# Patient Record
Sex: Female | Born: 1955
Health system: Southern US, Community
[De-identification: ages and names within clinical notes are randomized; demographics above are authoritative.]

## PROBLEM LIST (undated history)

## (undated) DIAGNOSIS — B029 Zoster without complications: Secondary | ICD-10-CM

## (undated) DIAGNOSIS — K259 Gastric ulcer, unspecified as acute or chronic, without hemorrhage or perforation: Secondary | ICD-10-CM

## (undated) DIAGNOSIS — E278 Other specified disorders of adrenal gland: Secondary | ICD-10-CM

## (undated) HISTORY — DX: Zoster without complications: B02.9

## (undated) HISTORY — DX: Gastric ulcer, unspecified as acute or chronic, without hemorrhage or perforation: K25.9

---

## 1983-07-03 HISTORY — PX: TUBAL LIGATION: SHX77

## 2011-08-21 ENCOUNTER — Other Ambulatory Visit (HOSPITAL_COMMUNITY): Payer: Self-pay | Admitting: Family Medicine

## 2011-08-21 DIAGNOSIS — IMO0002 Reserved for concepts with insufficient information to code with codable children: Secondary | ICD-10-CM

## 2011-08-23 ENCOUNTER — Ambulatory Visit (HOSPITAL_COMMUNITY)
Admission: RE | Admit: 2011-08-23 | Discharge: 2011-08-23 | Disposition: A | Discharge status: Home / Self Care | Source: Ambulatory Visit | Attending: Family Medicine | Admitting: Family Medicine

## 2011-08-23 DIAGNOSIS — IMO0002 Reserved for concepts with insufficient information to code with codable children: Secondary | ICD-10-CM

## 2011-09-27 ENCOUNTER — Emergency Department (HOSPITAL_COMMUNITY)
Admission: EM | Admit: 2011-09-27 | Discharge: 2011-09-27 | Disposition: A | Discharge status: Home / Self Care | Attending: Emergency Medicine | Admitting: Emergency Medicine

## 2011-09-27 ENCOUNTER — Emergency Department (HOSPITAL_COMMUNITY)

## 2011-09-27 ENCOUNTER — Encounter (HOSPITAL_COMMUNITY): Payer: Self-pay | Admitting: Physical Medicine and Rehabilitation

## 2011-09-27 ENCOUNTER — Other Ambulatory Visit: Payer: Self-pay

## 2011-09-27 DIAGNOSIS — H8110 Benign paroxysmal vertigo, unspecified ear: Secondary | ICD-10-CM

## 2011-09-27 HISTORY — DX: Other specified disorders of adrenal gland: E27.8

## 2011-09-27 LAB — BASIC METABOLIC PANEL
BUN: 14 mg/dL (ref 6–23)
Calcium: 8.7 mg/dL (ref 8.4–10.5)
Creatinine, Ser: 0.61 mg/dL (ref 0.50–1.10)
GFR calc Af Amer: >90 mL/min (ref >90)
GFR calc non Af Amer: >90 mL/min (ref >90)
Glucose, Bld: 100 mg/dL — ABNORMAL HIGH (ref 70–99)
Potassium: 4.3 mEq/L (ref 3.5–5.1)

## 2011-09-27 LAB — DIFFERENTIAL
Basophils Relative: 0 % (ref 0–1)
Eosinophils Absolute: 0.3 10*3/uL (ref 0.0–0.7)
Eosinophils Relative: 3 % (ref 0–5)
Monocytes Absolute: 0.3 10*3/uL (ref 0.1–1.0)
Monocytes Relative: 4 % (ref 3–12)
Neutrophils Relative %: 70 % (ref 43–77)

## 2011-09-27 LAB — CBC
Hemoglobin: 12.8 g/dL (ref 12.0–15.0)
MCH: 27.5 pg (ref 26.0–34.0)
MCHC: 32 g/dL (ref 30.0–36.0)
MCV: 85.8 fL (ref 78.0–100.0)

## 2011-09-27 MED ORDER — MECLIZINE HCL 25 MG PO TABS
50.0000 mg | ORAL_TABLET | Freq: Once | ORAL | Status: AC
  Administered 2011-09-27: 50 mg via ORAL
  Filled 2011-09-27: qty 2

## 2011-09-27 MED ORDER — LORAZEPAM 1 MG PO TABS: 1.0000 mg | ORAL_TABLET | Freq: Four times a day (QID) | ORAL | Status: AC | PRN

## 2011-09-27 MED ORDER — LORAZEPAM 1 MG PO TABS
1.0000 mg | ORAL_TABLET | Freq: Once | ORAL | Status: AC
  Administered 2011-09-27: 1 mg via ORAL
  Filled 2011-09-27: qty 1

## 2011-09-27 MED ORDER — MECLIZINE HCL 50 MG PO TABS: 50.0000 mg | ORAL_TABLET | Freq: Two times a day (BID) | ORAL | Status: AC | PRN

## 2011-09-27 NOTE — ED Notes (Signed)
Pt presents to department for evaluation of dizziness. Ongoing x5 days. Pt states symptoms became worse today. Able to move all extremities. Pupils round and reactive. Speech clear. She is alert and oriented x4. Pt states "I feel like I am drunk." denies pain.

## 2011-09-27 NOTE — ED Notes (Signed)
Pt presents with 5 day  H/o dizziness.  Pt denies any headache, reports symptoms occur when she is up and moving, reports dizziness stops when she closes her eyes and goes to sleep.  Pt requesting CT scan, is alert, oriented and in no acute distress.  Pt reports "I thought it was swimmer's ear".   Pt denies any nausea.

## 2011-09-27 NOTE — Discharge Instructions (Signed)
Benign Positional Vertigo Vertigo means you feel like you or your surroundings are moving when they are not. Benign positional vertigo is the most common form of vertigo. Benign means that the cause of your condition is not serious. Benign positional vertigo is more common in older adults. CAUSES  Benign positional vertigo is the result of an upset in the labyrinth system. This is an area in the middle ear that helps control your balance. This may be caused by a viral infection, head injury, or repetitive motion. However, often no specific cause is found. SYMPTOMS  Symptoms of benign positional vertigo occur when you move your head or eyes in different directions. Some of the symptoms may include:  Loss of balance and falls.   Vomiting.   Blurred vision.   Dizziness.   Nausea.   Involuntary eye movements (nystagmus).  DIAGNOSIS  Benign positional vertigo is usually diagnosed by physical exam. If the specific cause of your benign positional vertigo is unknown, your caregiver may perform imaging tests, such as magnetic resonance imaging (MRI) or computed tomography (CT). TREATMENT  Your caregiver may recommend movements or procedures to correct the benign positional vertigo. Medicines such as meclizine, benzodiazepines, and medicines for nausea may be used to treat your symptoms. In rare cases, if your symptoms are caused by certain conditions that affect the inner ear, you may need surgery. HOME CARE INSTRUCTIONS   Follow your caregiver's instructions.   Move slowly. Do not make sudden body or head movements.   Avoid driving.   Avoid operating heavy machinery.   Avoid performing any tasks that would be dangerous to you or others during a vertigo episode.   Drink enough fluids to keep your urine clear or pale yellow.  SEEK IMMEDIATE MEDICAL CARE IF:   You develop problems with walking, weakness, numbness, or using your arms, hands, or legs.   You have difficulty speaking.   You  develop severe headaches.   Your nausea or vomiting continues or gets worse.   You develop visual changes.   Your family or friends notice any behavioral changes.   Your condition gets worse.   You have a fever.   You develop a stiff neck or sensitivity to light.  MAKE SURE YOU:   Understand these instructions.   Will watch your condition.   Will get help right away if you are not doing well or get worse.  Document Released: 03/26/2006 Document Revised: 06/07/2011 Document Reviewed: 03/08/2011 ExitCare Patient Information 2012 [employer].Dizziness Dizziness is a common problem. It is a feeling of unsteadiness or lightheadedness. You may feel like you are about to faint. Dizziness can lead to injury if you stumble or fall. A person of any age group can suffer from dizziness, but dizziness is more common in older adults. CAUSES  Dizziness can be caused by many different things, including:  Middle ear problems.   Standing for too long.   Infections.   An allergic reaction.   Aging.   An emotional response to something, such as the sight of blood.   Side effects of medicines.   Fatigue.   Problems with circulation or blood pressure.   Excess use of alcohol, medicines, or illegal drug use.   Breathing too fast (hyperventilation).   An arrhythmia or problems with your heart rhythm.   Low red blood cell count (anemia).   Pregnancy.   Vomiting, diarrhea, fever, or other illnesses that cause dehydration.   Diseases or conditions such as Parkinson's disease, high blood   pressure (hypertension), diabetes, and thyroid problems.   Exposure to extreme heat.  DIAGNOSIS  To find the cause of your dizziness, your caregiver may do a physical exam, lab tests, radiologic imaging scans, or an electrocardiography test (ECG).  TREATMENT  Treatment of dizziness depends on the cause of your symptoms and can vary greatly. HOME CARE INSTRUCTIONS   Drink enough fluids to keep  your urine clear or pale yellow. This is especially important in very hot weather. In the elderly, it is also important in cold weather.   If your dizziness is caused by medicines, take them exactly as directed. When taking blood pressure medicines, it is especially important to get up slowly.   Rise slowly from chairs and steady yourself until you feel okay.   In the morning, first sit up on the side of the bed. When this seems okay, stand slowly while holding onto something until you know your balance is fine.   If you need to stand in one place for a long time, be sure to move your legs often. Tighten and relax the muscles in your legs while standing.   If dizziness continues to be a problem, have someone stay with you for a day or two. Do this until you feel you are well enough to stay alone. Have the person call your caregiver if he or she notices changes in you that are concerning.   Do not drive or use heavy machinery if you feel dizzy.  SEEK IMMEDIATE MEDICAL CARE IF:   Your dizziness or lightheadedness gets worse.   You feel nauseous or vomit.   You develop problems with talking, walking, weakness, or using your arms, hands, or legs.   You are not thinking clearly or you have difficulty forming sentences. It may take a friend or family member to determine if your thinking is normal.   You develop chest pain, abdominal pain, shortness of breath, or sweating.   Your vision changes.   You notice any bleeding.   You have side effects from medicine that seems to be getting worse rather than better.  MAKE SURE YOU:   Understand these instructions.   Will watch your condition.   Will get help right away if you are not doing well or get worse.  Document Released: 12/12/2000 Document Revised: 06/07/2011 Document Reviewed: 01/05/2011 ExitCare Patient Information 2012 [employer]. 

## 2011-09-27 NOTE — ED Provider Notes (Signed)
History     CSN: [id number]  Arrival date & time 09/27/11  1215   First MD Initiated Contact with Patient 09/27/11 1239      Chief Complaint  Patient presents with  . Dizziness    (Consider location/radiation/quality/duration/timing/severity/associated sxs/prior treatment) HPI Comments: The patient is a 56 year old female, with a history of adrenal hyperplasia, who presents for evaluation of 5 days of dizziness/vertiginous symptoms. She denies any preceding head injury, illness, and denies any headache, changes in vision, changes in hearing, focal numbness or weakness elsewhere in the body. She denies similar symptoms previously. Her symptoms are worsened with any movement of the head, and improved by laying still for prolonged period. In the emergency department, nystagmus is elicited along with symptoms by having the patient sit up, way back, or rotate/move ahead otherwise suggesting peripheral vertigo. Nystagmus is fatigable and does stop with remaining still, however the patient (even without nystagmus) states that symptoms continue when lying still. She reports that the vertigo is moderate to severe in intensity. She has not tried any treatment for her symptoms before coming to the emergency department.  The history is provided by the patient.    Past Medical History  Diagnosis Date  . Adrenal hyperplasia     History reviewed. No pertinent past surgical history.  No family history on file.  History  Substance Use Topics  . Smoking status: Never Smoker   . Smokeless tobacco: Not on file  . Alcohol Use: No    OB History    Grav Para Term Preterm Abortions TAB SAB Ect Mult Living                  Review of Systems  Constitutional: Negative for fever, chills, diaphoresis and appetite change.  HENT: Negative for hearing loss, ear pain, congestion, sore throat, facial swelling, rhinorrhea, trouble swallowing, neck pain, neck stiffness, dental problem, sinus pressure and  tinnitus.   Eyes: Negative for photophobia, pain, discharge, redness and visual disturbance.  Respiratory: Negative.   Cardiovascular: Negative.  Negative for palpitations.  Gastrointestinal: Negative for nausea, vomiting and abdominal pain.  Genitourinary: Negative.   Musculoskeletal: Negative for myalgias, back pain, arthralgias and gait problem.  Skin: Negative for pallor and rash.  Neurological: Positive for dizziness and light-headedness. Negative for tremors, seizures, syncope, facial asymmetry, speech difficulty, weakness, numbness and headaches.  Hematological: Negative for adenopathy.  Psychiatric/Behavioral: Negative.  Negative for confusion.    Allergies  Review of patient's allergies indicates no known allergies.  Home Medications   Current Outpatient Rx  Name Route Sig Dispense Refill  . CRANBERRY EXTRACT PO Oral Take 1 capsule by mouth daily.    Marland Kitchen GNP GINGKO BILOBA EXTRACT PO Oral Take 1 tablet by mouth daily.    Marland Kitchen GINSENG PO Oral Take 4 tablets by mouth daily.    Marland Kitchen HYDROCORTISONE 10 MG PO TABS Oral Take 20 mg by mouth 2 (two) times daily.    . IBUPROFEN 800 MG PO TABS Oral Take 800 mg by mouth every 8 (eight) hours as needed. For pain      BP 112/82  Pulse 81  Temp(Src) 97.9 F (36.6 C) (Oral)  Resp 20  SpO2 100%  Physical Exam  Nursing note and vitals reviewed. Constitutional: She is oriented to person, place, and time. She appears well-nourished. She is active and cooperative.  Non-toxic appearance. She does not have a sickly appearance. She does not appear ill. No distress.  HENT:  Head: Normocephalic and atraumatic. Head is  without raccoon's eyes, without Battle's sign and without contusion. No trismus in the jaw.  Right Ear: Hearing, tympanic membrane, external ear and ear canal normal.  Left Ear: Hearing, tympanic membrane, external ear and ear canal normal.  Nose: Nose normal.  Mouth/Throat: Uvula is midline, oropharynx is clear and moist and mucous  membranes are normal. No uvula swelling. No oropharyngeal exudate.  Eyes: Conjunctivae, EOM and lids are normal. Pupils are equal, round, and reactive to light. Right eye exhibits no chemosis, no discharge and no exudate. Left eye exhibits no chemosis, no discharge and no exudate. Right conjunctiva is not injected. Left conjunctiva is not injected. Right eye exhibits normal extraocular motion and no nystagmus. Left eye exhibits normal extraocular motion and no nystagmus.  Neck: Trachea normal, normal range of motion and phonation normal. Neck supple. No JVD present. Carotid bruit is not present. No rigidity. Normal range of motion present. No Brudzinski's sign noted.  Cardiovascular: Normal rate, regular rhythm, S1 normal, S2 normal, normal heart sounds, intact distal pulses and normal pulses.  Exam reveals no gallop and no friction rub.   No murmur heard. Pulmonary/Chest: Effort normal and breath sounds normal. No accessory muscle usage. Not tachypneic. No respiratory distress. She has no decreased breath sounds. She has no wheezes. She has no rhonchi. She has no rales.  Abdominal: Soft. Normal appearance, normal aorta and bowel sounds are normal. She exhibits no distension and no pulsatile midline mass. There is no tenderness. There is no rigidity.  Musculoskeletal: Normal range of motion. She exhibits no edema and no tenderness.  Neurological: She is alert and oriented to person, place, and time. She has normal strength. She is not disoriented. She displays no tremor and normal reflexes. No cranial nerve deficit or sensory deficit. She exhibits normal muscle tone. She displays no seizure activity. Coordination and gait normal. GCS eye subscore is 4. GCS verbal subscore is 5. GCS motor subscore is 6. She displays no Babinski's sign on the right side. She displays no Babinski's sign on the left side.  Reflex Scores:      Bicep reflexes are 2+ on the right side and 2+ on the left side.       Brachioradialis reflexes are 2+ on the right side and 2+ on the left side.      Patellar reflexes are 2+ on the right side and 2+ on the left side.      Patient with lateral nystagmus reproduced with the Dix-Hallpike maneuver, fatigable, and with no nystagmus when lying still. The patient reports that she "still feels a little" even while lying still and no nystagmus present. She has normal coordination by finger-nose-finger test and heel-to-shin test. She has a normal and steady independent gait. No cranial nerve deficits. No focal neurologic deficits other than the vertigo is elicited above.  Skin: Skin is warm, dry and intact. She is not diaphoretic. No pallor.  Psychiatric: She has a normal mood and affect. Her speech is normal and behavior is normal. Thought content normal. Cognition and memory are normal.    ED Course  Procedures (including critical care time)   Date: 09/27/2011  Rate: 71  Rhythm: normal sinus rhythm  QRS Axis: normal  Intervals: normal  ST/T Wave abnormalities: normal  Conduction Disutrbances: none  Narrative Interpretation: unremarkable       Labs Reviewed  CBC  DIFFERENTIAL  BASIC METABOLIC PANEL   No results found.   No diagnosis found.    MDM  The patient appears to  have peripheral vertigo, with nystagmus and symptoms that are able to be elicited with movement of the head, nystagmus that is fatigable, and no focal neurologic deficits otherwise, but she reports symptoms to persist with remaining still, although improved, and with no nystagmus. The patient is insistent upon receiving a head CT to exclude intracranial mass lesion or other finding. ECG performed and not suggestive of arrhythmia. With the patient's history of adrenal hyperplasia, I will also assess for electrolyte abnormality. Lastly, I will assess for anemia.        Felisa Bonier, MD 09/27/11 [phone]

## 2014-02-19 ENCOUNTER — Ambulatory Visit (INDEPENDENT_AMBULATORY_CARE_PROVIDER_SITE_OTHER)

## 2014-02-19 ENCOUNTER — Ambulatory Visit (INDEPENDENT_AMBULATORY_CARE_PROVIDER_SITE_OTHER): Admitting: Family Medicine

## 2014-02-19 ENCOUNTER — Encounter: Payer: Self-pay | Admitting: Family Medicine

## 2014-02-19 ENCOUNTER — Ambulatory Visit

## 2014-02-19 VITALS — BP 104/78 | HR 77 | Temp 98.2°F | Resp 14 | Ht 60.5 in | Wt 239.6 lb

## 2014-02-19 DIAGNOSIS — M79641 Pain in right hand: Secondary | ICD-10-CM

## 2014-02-19 DIAGNOSIS — Z131 Encounter for screening for diabetes mellitus: Secondary | ICD-10-CM

## 2014-02-19 DIAGNOSIS — M25529 Pain in unspecified elbow: Secondary | ICD-10-CM

## 2014-02-19 DIAGNOSIS — M25549 Pain in joints of unspecified hand: Secondary | ICD-10-CM

## 2014-02-19 DIAGNOSIS — R5383 Other fatigue: Secondary | ICD-10-CM

## 2014-02-19 DIAGNOSIS — R5381 Other malaise: Secondary | ICD-10-CM

## 2014-02-19 DIAGNOSIS — M79642 Pain in left hand: Secondary | ICD-10-CM

## 2014-02-19 DIAGNOSIS — M79609 Pain in unspecified limb: Secondary | ICD-10-CM

## 2014-02-19 DIAGNOSIS — E259 Adrenogenital disorder, unspecified: Secondary | ICD-10-CM

## 2014-02-19 DIAGNOSIS — E250 Congenital adrenogenital disorders associated with enzyme deficiency: Secondary | ICD-10-CM

## 2014-02-19 DIAGNOSIS — R252 Cramp and spasm: Secondary | ICD-10-CM

## 2014-02-19 DIAGNOSIS — R635 Abnormal weight gain: Secondary | ICD-10-CM

## 2014-02-19 LAB — COMPLETE METABOLIC PANEL WITHOUT GFR
Albumin: 4 g/dL (ref 3.5–5.2)
CO2: 27 meq/L (ref 19–32)
Calcium: 8.7 mg/dL (ref 8.4–10.5)
Chloride: 107 meq/L (ref 96–112)
GFR, Est African American: >89 mL/min
GFR, Est Non African American: >89 mL/min
Glucose, Bld: 103 mg/dL — ABNORMAL HIGH (ref 70–99)
Potassium: 4.2 meq/L (ref 3.5–5.3)
Sodium: 143 meq/L (ref 135–145)
Total Protein: 7.4 g/dL (ref 6.0–8.3)

## 2014-02-19 LAB — POCT SEDIMENTATION RATE: POCT SED RATE: 30 mm/hr — AB (ref 0–22)

## 2014-02-19 LAB — LIPID PANEL
Cholesterol: 169 mg/dL (ref 0–200)
HDL: 42 mg/dL (ref >39)
LDL Cholesterol: 102 mg/dL — ABNORMAL HIGH (ref 0–99)
Total CHOL/HDL Ratio: 4 ratio
Triglycerides: 124 mg/dL (ref <150)
VLDL: 25 mg/dL (ref 0–40)

## 2014-02-19 LAB — CBC
HCT: 39.9 % (ref 36.0–46.0)
Hemoglobin: 13.4 g/dL (ref 12.0–15.0)
MCH: 27.8 pg (ref 26.0–34.0)
MCHC: 33.6 g/dL (ref 30.0–36.0)
MCV: 82.8 fL (ref 78.0–100.0)
Platelets: 285 10*3/uL (ref 150–400)
RBC: 4.82 MIL/uL (ref 3.87–5.11)
RDW: 13.4 % (ref 11.5–15.5)
WBC: 7.5 10*3/uL (ref 4.0–10.5)

## 2014-02-19 LAB — TSH: TSH: 1.714 u[IU]/mL (ref 0.350–4.500)

## 2014-02-19 LAB — COMPLETE METABOLIC PANEL WITH GFR
ALT: 53 U/L — ABNORMAL HIGH (ref 0–35)
AST: 39 U/L — ABNORMAL HIGH (ref 0–37)
Alkaline Phosphatase: 68 U/L (ref 39–117)
BUN: 10 mg/dL (ref 6–23)
Creat: 0.65 mg/dL (ref 0.50–1.10)
Total Bilirubin: 0.6 mg/dL (ref 0.2–1.2)

## 2014-02-19 LAB — POCT GLYCOSYLATED HEMOGLOBIN (HGB A1C): Hemoglobin A1C: 5.7

## 2014-02-19 LAB — RHEUMATOID FACTOR: Rheumatoid fact SerPl-aCnc: <10 [IU]/mL (ref <=14)

## 2014-02-19 MED ORDER — MELOXICAM 15 MG PO TABS: 15.0000 mg | ORAL_TABLET | Freq: Every day | ORAL | Status: DC

## 2014-02-19 NOTE — Progress Notes (Signed)
Chief Complaint:  Chief Complaint  Patient presents with  . other    pt would like to be tested and see if she has arthritis  . other    establish care    HPI: Tracy Larson is a 58 y.o. female who is here for lab work since she feels tired and she has had cramps in her figners, she can walk from here to out the front door before she feels tired, she ahs had abut 100 lb weight gain, primarily in her midsection. She has a history of [place] Adrenal Hyperplasia and has been on steroids for a long time. She states her right hand pain , she has pain in both hands, is worse than left. She is right hand dominant. Adrenal Hyperplasia-she was seeing Dr Sharl Ma ( who is now working at and transferred to [facility], she had an outstanding bill so was  told to get herself  another endocrinologist) She is tryign to establish care with an endocrinologist Dr Altheimer but has not heard back yet. She denesi any HA, vision changes, n/v/abd pain, paresthesia.   Wt Readings from Last 3 Encounters:  02/19/14 239 lb 9.6 oz (108.682 kg)      Past Medical History  Diagnosis Date  . Adrenal hyperplasia    History reviewed. No pertinent past surgical history. History   Social History  . Marital Status: Married    Spouse Name: [name]    Number of Children: [name]  . Years of Education: [name]   Social History Main Topics  . Smoking status: Never Smoker   . Smokeless tobacco: None  . Alcohol Use: No  . Drug Use: None  . Sexual Activity: None   Other Topics Concern  . None   Social History Narrative  . None   History reviewed. No pertinent family history. No Known Allergies Prior to Admission medications   Medication Sig Start Date End Date Taking? Authorizing Provider  hydrocortisone (CORTEF) 10 MG tablet Take 20 mg by mouth 2 (two) times daily.   Yes Historical Provider, MD  ibuprofen (ADVIL,MOTRIN) 800 MG tablet Take 800 mg by mouth every 8 (eight) hours as needed. For pain   Yes Historical  Provider, MD     ROS: The patient denies fevers, chills, night sweats, unintentional weight loss, chest pain, palpitations, wheezing, dyspnea on exertion, nausea, vomiting, abdominal pain, dysuria, hematuria, melena, numbness, weakness, or tingling.   All other systems have been reviewed and were otherwise negative with the exception of those mentioned in the HPI and as above.    PHYSICAL EXAM: Filed Vitals:   02/19/14 0923  BP: 104/78  Pulse: 77  Temp: 98.2 F (36.8 C)  Resp: 14   Filed Vitals:   02/19/14 0923  Height: 5' 0.5" (1.537 m)  Weight: 239 lb 9.6 oz (108.682 kg)   Body mass index is 46.01 kg/(m^2).  General: Alert, no acute distress, obese, noncushingnoid in appearanace HEENT:  Normocephalic, atraumatic, oropharynx patent. EOMI, PERRLA Cardiovascular:  Regular rate and rhythm, no rubs murmurs or gallops.  No Carotid bruits, radial pulse intact. No pedal edema.  Respiratory: Clear to auscultation bilaterally.  No wheezes, rales, or rhonchi.  No cyanosis, no use of accessory musculature GI: No organomegaly, abdomen is soft and non-tender, positive bowel sounds.  No masses. Skin: No rashes. Neurologic: Facial musculature symmetric. Psychiatric: Patient is appropriate throughout our interaction. Lymphatic: No cervical lymphadenopathy Musculoskeletal: Gait intact. Hands-no nodules, she has paina t base of thumb mcp, right  and left, right greater than left Full ROM, full strength, sensation intact   LABS: Results for orders placed in visit on 02/19/14  POCT GLYCOSYLATED HEMOGLOBIN (HGB A1C)      Result Value Ref Range   Hemoglobin A1C 5.7       EKG/XRAY:   [facility] + djd , no fx or dislocations Please comment if there is any effects of chronic steroid use on bones   ASSESSMENT/PLAN: Encounter Diagnoses  Name Primary?  . Congenital adrenal hyperplasia Yes  . Muscle cramps   . Weight gain   . Other malaise and fatigue     . Pain in joint, upper arm, unspecified laterality   . Screening for diabetes mellitus   . Hand joint pain, right   . Hand pain, left    She needs an endocrinologist, if Dr Altheimer is not wiling to take her then will need to refer her, she will let us know No new meds Labs pending F/u pending labs  Gross sideeffects, risk and benefits, and alternatives of medications d/w patient. Patient is aware that all medications have potential sideeffects and we are unable to predict every sideeffect or drug-drug interaction that may occur.  Kariyah Baugh PHUONG, DO 02/19/2014 10:29 AM

## 2014-02-20 LAB — CORTISOL-AM, BLOOD: Cortisol - AM: 7.3 ug/dL (ref 4.3–22.4)

## 2014-02-23 ENCOUNTER — Other Ambulatory Visit: Payer: Self-pay

## 2014-02-23 NOTE — Telephone Encounter (Signed)
[facility] sent req for Rx for hydrocortisone tabs 10 mg. Dr Conley Rolls, you just saw pt but didn't Rx this for her. Do you want to Rx for pt until she sees endo?

## 2014-02-24 MED ORDER — HYDROCORTISONE 10 MG PO TABS: 20.0000 mg | ORAL_TABLET | Freq: Two times a day (BID) | ORAL | Status: DC

## 2014-02-25 ENCOUNTER — Encounter: Payer: Self-pay | Admitting: Family Medicine

## 2014-02-25 ENCOUNTER — Telehealth: Payer: Self-pay | Admitting: Family Medicine

## 2014-02-25 NOTE — Telephone Encounter (Signed)
LM about labs on phone

## 2014-03-01 ENCOUNTER — Telehealth: Payer: Self-pay

## 2014-03-01 NOTE — Telephone Encounter (Signed)
Pt says the medicine for her inflammatory arthritis is not working.  Wants to know if we can change it.  [phone]

## 2014-03-02 ENCOUNTER — Ambulatory Visit (INDEPENDENT_AMBULATORY_CARE_PROVIDER_SITE_OTHER): Admitting: Physician Assistant

## 2014-03-02 VITALS — BP 112/68 | HR 81 | Temp 98.0°F | Resp 16 | Ht 60.25 in | Wt 240.4 lb

## 2014-03-02 DIAGNOSIS — Z111 Encounter for screening for respiratory tuberculosis: Secondary | ICD-10-CM

## 2014-03-02 NOTE — Telephone Encounter (Signed)
Please advise lab results to give to pt in regards to arthritis. Pt is requesting something different for her pain. Mobic is not working anymore.

## 2014-03-02 NOTE — Progress Notes (Signed)
   Subjective:    Patient ID: Tracy Larson, female    DOB: [birth year], 57 y.o.   MRN: [id number]  HPI  Tuberculosis Risk Questionnaire  1. No Were you born outside the [place] in one of the following parts of the world: [place], [place], [place], [place] or [place]?    2. No Have you traveled outside the [place] and lived for more than one month in one of the following parts of the world: [place], [place], [place], [place] or [place]?    3. No  Do you have a compromised immune system such as from any of the following conditions:HIV/AIDS, organ or bone marrow transplantation, diabetes, immunosuppressive medicines (e.g. Prednisone, Remicaide), leukemia, lymphoma, cancer of the head or neck, gastrectomy or jejunal bypass, end-stage renal disease (on dialysis), or silicosis? Adrenal      4.Yes Have you ever or do you plan on working in: a residential care center, a health care facility, a jail or prison or homeless shelter?    5. No Have you ever: injected illegal drugs, used crack cocaine, lived in a homeless shelter  or been in jail or prison?     6. No Have you ever been exposed to anyone with infectious tuberculosis?    Tuberculosis Symptom Questionnaire  Do you currently have any of the following symptoms?  1. No Unexplained cough lasting more than 3 weeks?   2. No Unexplained fever lasting more than 3 weeks.   3. No Night Sweats (sweating that leaves the bedclothes and sheets wet)     4. No Shortness of Breath   5. No Chest Pain   6. No Unintentional weight loss    7. No Unexplained fatigue (very tired for no reason)     Review of Systems     Objective:   Physical Exam        Assessment & Plan:

## 2014-03-02 NOTE — Progress Notes (Signed)
   Subjective:    Patient ID: Tracy Larson, female    DOB: 07/02/1956, 58 y.o.   MRN: [id number]  HPI  Pt presents to clinic for a PPD for work.  She is a Patent examiner.  She had a neg PPD last year.  See CDC questionnaire   Review of Systems.     Objective:   Physical Exam  Vitals reviewed. Constitutional: She is oriented to person, place, and time. She appears well-developed and well-nourished.  HENT:  Head: Normocephalic and atraumatic.  Right Ear: External ear normal.  Left Ear: External ear normal.  Pulmonary/Chest: Effort normal.  Neurological: She is alert and oriented to person, place, and time.  Skin: Skin is warm and dry.  Psychiatric: She has a normal mood and affect. Her behavior is normal. Judgment and thought content normal.       Assessment & Plan:  Screening-pulmonary TB - Plan: TB Skin Test    Benny Lennert PA-C  [facility] 03/02/2014 2:10 PM

## 2014-03-05 ENCOUNTER — Ambulatory Visit (INDEPENDENT_AMBULATORY_CARE_PROVIDER_SITE_OTHER): Admitting: Radiology

## 2014-03-05 DIAGNOSIS — Z111 Encounter for screening for respiratory tuberculosis: Secondary | ICD-10-CM

## 2014-03-05 LAB — TB SKIN TEST
INDURATION: 0 mm
TB Skin Test: NEGATIVE

## 2014-04-03 ENCOUNTER — Other Ambulatory Visit: Payer: Self-pay | Admitting: Family Medicine

## 2014-04-03 MED ORDER — TRAMADOL HCL 50 MG PO TABS: 50.0000 mg | ORAL_TABLET | Freq: Two times a day (BID) | ORAL | Status: DC | PRN

## 2014-04-05 ENCOUNTER — Other Ambulatory Visit: Payer: Self-pay | Admitting: Family Medicine

## 2014-04-05 ENCOUNTER — Telehealth: Payer: Self-pay | Admitting: *Deleted

## 2014-04-05 NOTE — Telephone Encounter (Signed)
LM for pt- tramadol script is in pick up drawer. Asked to call back if she would like script faxed to her mail order company or she can pick it up to bring to a local pharmacy.

## 2014-05-07 ENCOUNTER — Other Ambulatory Visit: Payer: Self-pay | Admitting: Family Medicine

## 2014-05-10 NOTE — Telephone Encounter (Signed)
Dr Le, do you want to give RFs? 

## 2014-05-24 ENCOUNTER — Telehealth: Payer: Self-pay | Admitting: Family Medicine

## 2014-05-24 NOTE — Telephone Encounter (Signed)
LM about cortef, she was supposed to see an endocrinologist, if she does not have an appt then we can refill it but I prefer the endocrinologist to do it.

## 2014-05-25 ENCOUNTER — Telehealth: Payer: Self-pay

## 2014-05-25 NOTE — Telephone Encounter (Signed)
Patient is needing to let Dr Conley RollsLe know that she does not have an endocrinologist and is needing to see if you could refill the medicine for you.   Best#: [phone]

## 2014-05-25 NOTE — Telephone Encounter (Signed)
Pt needs to RTC for labs to be drawn and prescribe medication.

## 2014-05-27 NOTE — Telephone Encounter (Signed)
Called-left message for her to call back

## 2014-05-28 ENCOUNTER — Other Ambulatory Visit: Payer: Self-pay

## 2014-05-28 DIAGNOSIS — E250 Congenital adrenogenital disorders associated with enzyme deficiency: Secondary | ICD-10-CM

## 2014-05-28 NOTE — Telephone Encounter (Signed)
Dr Conley RollsLe, see also notes under 11/23 and 11/24 messages. Do you want to RF these? Pended.

## 2014-05-28 NOTE — Telephone Encounter (Signed)
Left message / 3rd attempt to reach/ advised come in for visit.

## 2014-05-28 NOTE — Telephone Encounter (Signed)
Refill on hydrocortisone and tramadol

## 2014-05-29 MED ORDER — HYDROCORTISONE 10 MG PO TABS: 20.0000 mg | ORAL_TABLET | Freq: Two times a day (BID) | ORAL | Status: DC

## 2014-05-29 MED ORDER — TRAMADOL HCL 50 MG PO TABS: 50.0000 mg | ORAL_TABLET | Freq: Two times a day (BID) | ORAL | Status: DC | PRN

## 2014-05-31 NOTE — Telephone Encounter (Addendum)
LMOM on pt's H # to CB (cell # was work # and pt doesn't work in the office). RFs sent into pharm but Dr Conley RollsLe wanted us to tell her that she REALLY does need to see an endocrinologist. Make sure pt doesn't need a referral. Tramadol faxed to [facility].

## 2014-06-02 NOTE — Addendum Note (Signed)
Addended by: Sheppard PlumberBRIGGS, Santos Sollenberger A on: 06/02/2014 01:52 PM   Modules accepted: Orders

## 2014-06-02 NOTE — Telephone Encounter (Signed)
I was able to reach pt who did agree to see an endocrinologist. Pended referral. Dr Conley RollsLe, I would have just put this order in, but wanted to send it to you in case you wanted to specify a particular Endo.

## 2014-06-04 ENCOUNTER — Ambulatory Visit (INDEPENDENT_AMBULATORY_CARE_PROVIDER_SITE_OTHER): Admitting: Family Medicine

## 2014-06-04 ENCOUNTER — Encounter: Payer: Self-pay | Admitting: Family Medicine

## 2014-06-04 VITALS — BP 100/68 | HR 88 | Temp 98.2°F | Resp 16 | Ht 61.0 in | Wt 239.0 lb

## 2014-06-04 DIAGNOSIS — M1990 Unspecified osteoarthritis, unspecified site: Secondary | ICD-10-CM

## 2014-06-04 DIAGNOSIS — E250 Congenital adrenogenital disorders associated with enzyme deficiency: Secondary | ICD-10-CM

## 2014-06-04 DIAGNOSIS — E259 Adrenogenital disorder, unspecified: Secondary | ICD-10-CM

## 2014-06-04 DIAGNOSIS — R748 Abnormal levels of other serum enzymes: Secondary | ICD-10-CM

## 2014-06-04 LAB — COMPLETE METABOLIC PANEL WITH GFR
AST: 34 U/L (ref 0–37)
Alkaline Phosphatase: 88 U/L (ref 39–117)
BUN: 21 mg/dL (ref 6–23)
GFR, Est Non African American: 72 mL/min
Glucose, Bld: 169 mg/dL — ABNORMAL HIGH (ref 70–99)
Potassium: 4.9 mEq/L (ref 3.5–5.3)
Sodium: 143 mEq/L (ref 135–145)
Total Bilirubin: 0.3 mg/dL (ref 0.2–1.2)

## 2014-06-04 LAB — COMPLETE METABOLIC PANEL WITHOUT GFR
ALT: 40 U/L — ABNORMAL HIGH (ref 0–35)
Albumin: 3.8 g/dL (ref 3.5–5.2)
CO2: 29 meq/L (ref 19–32)
Calcium: 8.7 mg/dL (ref 8.4–10.5)
Chloride: 106 meq/L (ref 96–112)
Creat: 0.89 mg/dL (ref 0.50–1.10)
GFR, Est African American: 83 mL/min
Total Protein: 7.3 g/dL (ref 6.0–8.3)

## 2014-06-04 MED ORDER — TRAMADOL HCL 50 MG PO TABS: 50.0000 mg | ORAL_TABLET | Freq: Two times a day (BID) | ORAL | Status: DC | PRN

## 2014-06-04 MED ORDER — MELOXICAM 15 MG PO TABS: ORAL_TABLET | ORAL | Status: DC

## 2014-06-04 NOTE — Patient Instructions (Signed)
Only take one mobic pill a day. If having more pain, may take a tramadol. Remember it may make you sleepy, so try it at night first. You will get a call to make an appointment with endocrinology. We will call you with the results of your tests today.

## 2014-06-04 NOTE — Progress Notes (Signed)
Subjective:    Patient ID: Tracy PringleJennifer Pettinger, female    DOB: 1956-04-28, 58 y.o.   MRN: [id number]  HPI  This is a 58 year old female with PMH congenital adrenal hyperplasia who is presenting needing a referral to endocrinology. She used to see Dr. Sharl MaKerr but was told she had to see another endocrinologist d/t an outstanding bill. She was trying to get in with Dr. Leslie DalesAltheimer but never heard back. She currently takes hydrocortisone 40 mg QAM and 40 mg QPM. She increases her daily dose by 10 mg if she is sick or if she has been exposed to someone who is sick.   Arthritis: At last visit she was prescribed mobic 15 mg. She reports this works really well for her pain. Prior to being prescribed mobic she was taking 3 aspirin 81 mg and 4 ibuprofen 200 mg every day for pain. She takes mobic once most days but admits she will sometimes take two.  Review of Systems  Constitutional: Negative for fever and chills.  HENT: Negative for congestion and sore throat.   Respiratory: Negative for cough and shortness of breath.   Cardiovascular: Negative for chest pain.  Gastrointestinal: Negative for nausea, vomiting, abdominal pain and diarrhea.  Genitourinary: Negative for dysuria and difficulty urinating.  Musculoskeletal: Positive for arthralgias.  Skin: Negative for rash.  Neurological: Negative for seizures and headaches.   Patient Active Problem List   Diagnosis Date Noted  . Arthritis 06/04/2014  . Congenital adrenal hyperplasia 02/21/2014   Prior to Admission medications   Medication Sig Start Date End Date Taking? Authorizing Provider  hydrocortisone (CORTEF) 10 MG tablet Take 2 tablets (20 mg total) by mouth 2 (two) times daily. 05/29/14  Yes Thao P Le, DO  meloxicam (MOBIC) 15 MG tablet TAKE 1 TABLET DAILY (NO OTHER NSAIDS, TAKE WITH FOOD) 04/06/14  Yes Lanier ClamNicole Bush V, PA-C   No Known Allergies Patient's social and family history were reviewed.  Objective:   Physical Exam  Constitutional: She  is oriented to person, place, and time. She appears well-developed and well-nourished. No distress.  obese  HENT:  Head: Normocephalic and atraumatic.  Right Ear: Hearing normal.  Left Ear: Hearing normal.  Nose: Nose normal.  Mouth/Throat: Uvula is midline and oropharynx is clear and moist.  Eyes: Conjunctivae and lids are normal. Right eye exhibits no discharge. Left eye exhibits no discharge. No scleral icterus.  Cardiovascular: Normal rate, regular rhythm, normal heart sounds, intact distal pulses and normal pulses.   No murmur heard. Pulmonary/Chest: Effort normal and breath sounds normal. No respiratory distress. She has no wheezes. She has no rhonchi. She has no rales.  Abdominal: Soft. There is no tenderness.  Musculoskeletal: Normal range of motion.  Lymphadenopathy:    She has no cervical adenopathy.  Neurological: She is alert and oriented to person, place, and time.  Skin: Skin is warm, dry and intact. No lesion and no rash noted.  No LE edema  Psychiatric: She has a normal mood and affect. Her speech is normal and behavior is normal. Thought content normal.   BP 100/68 mmHg  Pulse 88  Temp(Src) 98.2 F (36.8 C)  Resp 16  Ht 5\' 1"  (1.549 m)  Wt 239 lb (108.41 kg)  BMI 45.18 kg/m2  SpO2 98%     Assessment & Plan:  1. Arthritis Instructed pt she cannot take mobic 15 mg more than once a day, discussed effects on health. She was prescribed tramadol to take for arthritis pain not  controlled by mobic. Side effects discussed.  - traMADol (ULTRAM) 50 MG tablet; Take 1 tablet (50 mg total) by mouth every 12 (twelve) hours as needed. For arthritic pain not controlled by Mobic. May cause constipation. Please use with stool softener. If you have seizures, it may lower you seizure threshold.  Dispense: 30 tablet; Refill: 0 - meloxicam (MOBIC) 15 MG tablet; TAKE 1 TABLET DAILY (NO OTHER NSAIDS, TAKE WITH FOOD)  Dispense: 90 tablet; Refill: 0  2. Abnormal liver enzymes Blood work  at last visit revealed slightly elevated liver enzymes. Will repeat today and obtain hepatitis panel.  - Hepatitis panel, acute - COMPLETE METABOLIC PANEL WITH GFR  3. Congenital adrenal hyperplasia Pt is stable on current dose of hydrocortisone. Referred to se Dr. Elvera LennoxGherghe.  - Ambulatory referral to Endocrinology   Roswell MinersNicole V. Dyke BrackettBush, PA-C, MHS [facility]  06/04/2014

## 2014-06-05 LAB — HEPATITIS PANEL, ACUTE
HCV Ab: NEGATIVE
Hep A IgM: NONREACTIVE
Hep B C IgM: NONREACTIVE
Hepatitis B Surface Ag: NEGATIVE

## 2014-06-07 NOTE — Progress Notes (Signed)
Agree with A/P. Dr Genesys Coggeshall 

## 2014-06-30 ENCOUNTER — Encounter: Payer: Self-pay | Admitting: *Deleted

## 2014-07-13 ENCOUNTER — Telehealth: Payer: Self-pay

## 2014-07-13 NOTE — Telephone Encounter (Signed)
LMOM for pt that Rx was sent to [facility] on 06/04/14 for #90. Asked her to CB if she needs a Rx sent elsewhere and/or if she got this Rx?

## 2014-07-13 NOTE — Telephone Encounter (Signed)
Pt requesting refill on meloxicam (MOBIC) 15 MG tablet [[id number]][[id number]]

## 2014-07-14 ENCOUNTER — Telehealth: Payer: Self-pay

## 2014-07-14 NOTE — Telephone Encounter (Signed)
lmom for pt to call back

## 2014-07-14 NOTE — Telephone Encounter (Signed)
Pt had LM on VM after Melanie called her, but I also had a very hard time understanding her message. I tried to call her back but got her VM. I apologized that we kept having to leave messages and advised that we are open until 8:30 and if she'd like to CB and ask for the clin TL she will be happy to help her (I am leaving the office now).

## 2014-07-14 NOTE — Telephone Encounter (Signed)
Please call patient about one of her medications. I could not get a good grasp as to what patient was requesting. Patient was muffled and trying to talk very loudly and I was unable to understand which medications she was asking about. [facility] also went down during phone call as I was trying to look in patient's med list.  [phone]

## 2014-07-16 ENCOUNTER — Other Ambulatory Visit: Payer: Self-pay

## 2014-07-16 ENCOUNTER — Ambulatory Visit: Admitting: Internal Medicine

## 2014-07-16 DIAGNOSIS — M1990 Unspecified osteoarthritis, unspecified site: Secondary | ICD-10-CM

## 2014-07-16 NOTE — Telephone Encounter (Signed)
Pt would like a refill on meloxicam (MOBIC) 15 MG tablet [[id number]. She would like this sent to [facility]. Please advise at [phone]

## 2014-07-19 NOTE — Telephone Encounter (Signed)
Pt is only supposed to be taking one tablet of mobic a day. We discussed this extensively last visit. If she is out of her 90 pills, then she is taking mobic twice a day. She was prescribed tramadol to take for pain not controlled by mobic. Please ask pt how she is taking her mobic and tramadol.

## 2014-07-21 NOTE — Telephone Encounter (Signed)
Spoke with Tracy DikeJennifer, she states she was taking mobic from a prescription written back in 10/15.  She didn't realize she hadn't filled the most recent  Prescription and is going to do so.  She states she knows she is only suppose to take it once a day.

## 2014-07-29 ENCOUNTER — Ambulatory Visit: Admitting: Internal Medicine

## 2014-08-05 ENCOUNTER — Ambulatory Visit (INDEPENDENT_AMBULATORY_CARE_PROVIDER_SITE_OTHER): Admitting: Internal Medicine

## 2014-08-05 VITALS — BP 104/68 | HR 94 | Temp 98.1°F | Resp 12 | Ht 62.0 in | Wt 242.8 lb

## 2014-08-05 DIAGNOSIS — E250 Congenital adrenogenital disorders associated with enzyme deficiency: Secondary | ICD-10-CM

## 2014-08-05 DIAGNOSIS — E259 Adrenogenital disorder, unspecified: Secondary | ICD-10-CM

## 2014-08-05 MED ORDER — HYDROCORTISONE 10 MG PO TABS: 15.0000 mg | ORAL_TABLET | Freq: Two times a day (BID) | ORAL | Status: DC

## 2014-08-05 NOTE — Patient Instructions (Addendum)
Please come back for labs at 8 am, before taking your hydrocortisone in am.  Decrease hydrocortisone to 15 mg 2x a day. Start this after we get the labs.  Please let me know if you develop salt craving or get dizzy when you stand from a sitting position.  - You absolutely need to take the steroid medication every day and not skip doses. - Please double the dose if you have a fever, for the duration of the fever. - If you cannot take anything by mouth (vomiting) or you have severe diarrhea so that you eliminate the steroid pills in your stool, please make sure that you get the steroids in the vein instead - go to the nearest emergency department/urgent care or you may go to your PCPs office  - Please wear a MedAlert bracelet or pendant indicating: "Adrenal insufficiency".  Please return for another visit in 3 months.

## 2014-08-05 NOTE — Progress Notes (Signed)
Patient ID: Tracy Larson, female   DOB: 05-05-1956, 59 y.o.   MRN: [id number]   HPI  Tracy Larson is a 59 y.o.-year-old female, referred by her PCP, Dr. Marin Comment, for management of classical congenital adrenal hyperplasia. She has been followed by Dr. Buddy Duty, but had to switch dr's b/c outstanding bill with his office. I do not have records from Dr Buddy Duty.  Pt. has been dx with adrenal insufficiency at birth and then 59 y/o - became pregnant (she had 4 children - no infertility).   She did not have to have vaginoplasty.  Pt is on replacement with hydrocortisone - initially on 15 mg a day, but over the years she had to increase to 100 mg daily for 15 years when her children were growing, then decreased to 50 mg daily for another 10 years, then 40 mg for 16 years.: - am: 20 mg - mid-day: - - evening: 20 mg  She tells tells me she cannot function at lower doses of steroid  She is also on fludrocortisone, but developed edema >> had to stop.  No salt-craving. No sxs of orthostasis.  She has h/o adrenal crises:   1985   2005 - 2/2 flu [facility]).  She is aware that she needs to increase the dose of her glucocorticoid (GC) if she has a fever >101F and she needs to get GC injectable if she cannot take po.   Her height is: 5'1.5". Mother: ?Marland Kitchen Father: 5'7".  She does not have weight gain, DM2, HTN, or osteoporosis/fractures. She had a DEXA scan in 2013:  Results: [facility] Lumbar spine (L1-L3) Femoral neck (FN) 33% distal radius  T-score + 0.5 RFN:- 0.1 LFN:+ 0.5 n/a   She has bone and mm pain. She was started on Meloxicam, Ultram >> helps.  She has hirsutism - stable, was not on OCPs. No acne.   She did not have genetic counseling at dx.  Pt c/o: - + mm, joint, and bone pain - No other complaints  Reviewed most recent pertinent labs: Component     Latest Ref Rng 02/19/2014 06/04/2014  Sodium     135 - 145 mEq/L  143  Potassium     3.5 - 5.3 mEq/L  4.9  Chloride      96 - 112 mEq/L  106  CO2     19 - 32 mEq/L  29  Glucose     70 - 99 mg/dL  169 (H)  BUN     6 - 23 mg/dL  21  Creatinine     0.50 - 1.10 mg/dL  0.89  Total Bilirubin     0.2 - 1.2 mg/dL  0.3  Alkaline Phosphatase     39 - 117 U/L  88  AST     0 - 37 U/L  34  ALT     0 - 35 U/L  40 (H)  Total Protein     6.0 - 8.3 g/dL  7.3  Albumin     3.5 - 5.2 g/dL  3.8  Calcium     8.4 - 10.5 mg/dL  8.7  GFR, Est African American       83  GFR, Est Non African American       72  Hgb A1c MFr Bld      5.7   TSH     0.350 - 4.500 uIU/mL 1.714   Cortisol - AM     4.3 - 22.4 ug/dL 7.3    Meals:  Mostly fast foods  ROS: Constitutional: no weight gain/loss, + increased appetite,  no fatigue, no subjective hyperthermia/hypothermia, + nocturia Eyes: no blurry vision, no xerophthalmia ENT: no sore throat, no nodules palpated in throat, no dysphagia/odynophagia, no hoarseness Cardiovascular: no CP/SOB/palpitations/leg swelling Respiratory: no cough/SOB Gastrointestinal: no N/V/D/C Musculoskeletal: +both: muscle/joint aches Skin: no rashes Neurological: no tremors/numbness/tingling/dizziness Psychiatric: no depression/anxiety  Past Medical History  Diagnosis Date  . Adrenal hyperplasia    Past surgical history: - C sections  History   Social History  . Marital Status: Married    Spouse Name: [name]    Number of Children: 4   Occupational History  .  nurse tech    Social History Main Topics  . Smoking status: Never Smoker   . Smokeless tobacco: Not on file  . Alcohol Use: No  . Drug Use: No   Current Outpatient Prescriptions on File Prior to Visit  Medication Sig Dispense Refill  . hydrocortisone (CORTEF) 10 MG tablet Take 2 tablets (20 mg total) by mouth 2 (two) times daily. 360 tablet 0  . meloxicam (MOBIC) 15 MG tablet TAKE 1 TABLET DAILY (NO OTHER NSAIDS, TAKE WITH FOOD) 90 tablet 0  . traMADol (ULTRAM) 50 MG tablet Take 1 tablet (50 mg total) by mouth every 12  (twelve) hours as needed. For arthritic pain not controlled by Mobic. May cause constipation. Please use with stool softener. If you have seizures, it may lower you seizure threshold. 30 tablet 0   No current facility-administered medications on file prior to visit.   No Known Allergies   No family history of diabetes, hypertension, hyperlipidemia, heart disease, cancer.   PE: BP 104/68 mmHg  Pulse 94  Temp(Src) 98.1 F (36.7 C) (Oral)  Resp 12  Ht _0  (1.575 m)  Wt 242 lb 12.8 oz (110.133 kg)  BMI 44.40 kg/m2  SpO2 97%  Orthostatics:   Sitting: 124/72, pulse 107   Laying down: 104/62, pulse 92   Standing 104/68, pulse 94  Wt Readings from Last 3 Encounters:  06/04/14 239 lb (108.41 kg)  03/02/14 240 lb 6.4 oz (109.045 kg)  02/19/14 239 lb 9.6 oz (108.682 kg)   Constitution: Obese, in NAD, +full supraclavicular fat pads  Eyes: PERRLA, EOMI, no exophthalmos ENT: moist mucous membranes, no thyromegaly, no cervical lymphadenopathy Cardiovascular: tachycardia, RR, No MRG Respiratory: CTA B Gastrointestinal: abdomen soft, NT, ND, BS+ Musculoskeletal: no deformities, strength intact in all 4 Skin: moist, warm, no rashes; + hirsutism on chin  Neurological: no tremor with outstretched hands, DTR normal in all 4  ASSESSMENT: 1. ? Classical CAH  PLAN:  Patient has a history of lifelong CAH, unclear if classical versus severe nonclassical. I would tend to believe that she has nonclassical CAH with adrenal insufficiency, since she did not have to have vaginoplasty and she was successful in getting pregnant with 4 children - CAH is a condition in which the adrenal glands cannot produce enough cortisol and the hormone production is redirected towards androgen production via increased ACTH. Therefore, we tx this with glucocorticoids not only to treat the adrenal insufficiency, but also to decrease the ACTH and, therefore, adrenal androgens. OCPs also help in reducing the androgens.   - as of now, she does not appear to need fludrocortisone, but will check labs to better evaluate her salt-fluid balance (see below). Of note, she has a high salt diet - eating mostly fast food, and she is also on the high dose of hydrocortisone, so it is very  likely that she does not need for fludrocortisone.  - we discussed about proper replacement with Hydrocortisone (HC) >>  I explained side effects of overreplacement on many organs in the body and the fact that we need to decrease the dose to the minimum dose that allows her to feel well but also to suppress ACTH and androgens (17 hydroxyprogesterone, androstenedione, DHEAS, testosterone) to normal or slightly above normal. She has been on tremendously high doses of hydrocortisone throughout her life, but she does not appear to have many complications from this other than obesity. She does not have osteoporosis, diabetes, hypertension. She tells me that the dose of hydrocortisone was tried to be decreased in the past, without success, as her hormones started to rise. I discussed with her that after menopause the hormone start to decrease by themselves, so we will likely be able to manage her on the lower hydrocortisone dose. She agrees to try to decrease the dose to 15 mg twice a day. We will do this after checking the above labs. - a bid or tid HC dose is likely best, with the highest dose at bedtime (which is opposite to treating simple adrenal insufficiency). She has been on twice a day hydrocortisone for all her life so we will continue with this.  - I reiterated sick days rules for HC:  If you cannot keep anything down, including your hydrocortisone medication, please go to the emergency room or your primary care physician office to get steroids injected in the muscle or vein.  If you have a fever (more than 101 Fahrenheit) or gastroenteritis with nausea/vomiting and diarrhea, please double the dose of your hydrocortisone for the duration of the  fever or the gastroenteritis.  Do not run out of your hydrocortisone medication. - advised for [place] bracelet mentioning "adrenal insufficiency". She has one but is not wearing it. I advised her to start wearing it.  -  I had the patient sign a release of information consent so we can obtain the records from Dr. Buddy Duty   - time spent with the patient: 1 hour, of which >50% was spent in obtaining information about her symptoms, reviewing her previous labs, evaluations, and treatments, counseling her about her condition (please see the discussed topics above), and developing a plan to further investigate and treat it. she had a number of questions which I addressed.    [facility] [address], [address] [place], [place] [address] Phone: [phone] Fax: [phone]   August 23, 2014   Tracy Larson [address] [place] [address]   Dear Ms. Avitabile,  Below are the results from your recent visit:  Resulted Orders  Comp Met (CMET)  Result Value Ref Range   Sodium 140 135 - 145 mEq/L   Potassium 3.8 3.5 - 5.1 mEq/L   Chloride 107 96 - 112 mEq/L   CO2 29 19 - 32 mEq/L   Glucose, Bld 104 (H) 70 - 99 mg/dL   BUN 18 6 - 23 mg/dL   Creatinine, Ser 0.67 0.40 - 1.20 mg/dL   Total Bilirubin 0.5 0.2 - 1.2 mg/dL   Alkaline Phosphatase 79 39 - 117 U/L   AST 41 (H) 0 - 37 U/L   ALT 51 (H) 0 - 35 U/L   Total Protein 7.5 6.0 - 8.3 g/dL   Albumin 3.7 3.5 - 5.2 g/dL   Calcium 8.7 8.4 - 10.5 mg/dL   GFR 116.34 >60.00 mL/min  Aldosterone + renin activity w/ ratio  Result Value Ref Range   PRA LC/MS/MS 1.67 0.25 - 5.82 ng/mL/h   ALDO / PRA Ratio 1.8 0.9 - 28.9 Ratio   Aldosterone 3 ng/dL     Comment:       Adult Reference Ranges for Aldosterone,  LC/MS/MS:   Upright 8:00-10:00 am < or = 28 ng/dL  Upright 4:00-6:00 pm < or = 21 ng/dL   Supine 8:00-10:00 am 3-16 ng/dL    Narrative   Performed at: [place], [place] [address]  Testosterone, free, total  Result Value Ref Range   Testosterone 66 10 - 70 ng/dL     Comment:      Tanner Stage Female Female  I < 30 ng/dL < 10 ng/dL  II < 150 ng/dL < 30 ng/dL  III 100-320 ng/dL < 35 ng/dL  IV 200-970 ng/dL 15-40 ng/dL  V/Adult 300-890 ng/dL 10-70 ng/dL     Sex Hormone Binding 25 14 - 73 nmol/L   Testosterone, Free 13.9 (H) 0.6 - 6.8 pg/mL     Comment:      The concentration of free testosterone is derived from a mathematical expression based on constants for the binding of testosterone to sex hormone-binding globulin and albumin.    Testosterone-% Free 2.1 0.4 - 2.4 %   Narrative   Performed at: [place], [address]  [place], [place] [address]  DHEA  Result Value Ref Range   DHEA 27 (L) 102 - 1185 ng/dL   Narrative   Performed at: [address] [place] [address]  Androstenedione  Result Value Ref Range   Androstenedione 10 ng/dL     Comment:      Adult Female Reference Ranges for  Androstenedione, Serum:   Follicular Phase: 56-433 ng/dL  Luteal Phase: 30-235 ng/dL  Postmenopausal Phase: 20-75 ng/dL    Narrative   Performed at: [address] [place] [address]  17-Hydroxyprogesterone  Result Value Ref Range   17-OH-Progesterone, LC/MS/MS 2014 (H) ng/dL     Comment:      Adult Female Reference Ranges  for  17-Hydroxyprogesterone, LC/MS/MS:   Follicular Phase: < or = 185 ng/dL  Luteal Phase: < or = 285 ng/dL  Postmenopausal Phase: < or = 45 ng/dL  Pregnancy:  First Trimester: 78-457 ng/dL  Second Trimester: 90-357 ng/dL  Third Trimester: 144-578 ng/dL    Narrative   Performed at: [address]  [place], [place] [address]  ACTH  Result Value Ref Range   C206 ACTH 78 (H) 6 - 50 pg/mL     Comment:     Reference range applies only to specimens collected between 7am-10am    Narrative   Performed at: [facility]  [address] [place] [address]   Your liver tests are fairly high, please discuss with your PCP about this.  Your aldosterone and renin levels are good, there is no need for fludrocortisone for now.  The ACTH is a little high, but improved from before (per records that I got from Dr. Buddy Duty).  The testosterone and 17 hydroxy Progesterone levels are also higher than normal, but improved.  The rest of the labs are good.  Since you are already on a high steroid dose, I would prefer not to increase it to suppress the above levels further. Please continue the same steroid  dose for now.  If you have any questions or concerns, please don't hesitate to call.  Sincerely, ,Philemon Kingdom, MD PhD [facility] [address] [place], [place] [address] Tel. [phone] Fax [phone]         Received records from Dr. Buddy Duty: - Patient was previously on fludrocortisone, however, this rendered here hypertensive. Fludrocortisone was eventually stopped. Dr. Buddy Duty mentioned that she is on a high dose of steroids, and probably her many years of over replacement with steroids have caused her secondary adrenal insufficiency.  Labs from 2014 were reviewed: 09/24/2012: - 17  hydroxyprogesterone 6180 - Hemoglobin A1c 5.4% - Lipids: 176/94/40/126 - TSH 1.94 - LFTs normal - ACTH 201.7 - DHEAS 42.5 - Aldosterone <1, PRA 1.37

## 2014-08-06 ENCOUNTER — Other Ambulatory Visit (INDEPENDENT_AMBULATORY_CARE_PROVIDER_SITE_OTHER)

## 2014-08-06 DIAGNOSIS — E259 Adrenogenital disorder, unspecified: Secondary | ICD-10-CM

## 2014-08-06 DIAGNOSIS — E250 Congenital adrenogenital disorders associated with enzyme deficiency: Secondary | ICD-10-CM

## 2014-08-06 LAB — COMPREHENSIVE METABOLIC PANEL
ALT: 51 U/L — ABNORMAL HIGH (ref 0–35)
AST: 41 U/L — ABNORMAL HIGH (ref 0–37)
Albumin: 3.7 g/dL (ref 3.5–5.2)
Alkaline Phosphatase: 79 U/L (ref 39–117)
BUN: 18 mg/dL (ref 6–23)
CALCIUM: 8.7 mg/dL (ref 8.4–10.5)
CO2: 29 mEq/L (ref 19–32)
Chloride: 107 mEq/L (ref 96–112)
Creatinine, Ser: 0.67 mg/dL (ref 0.40–1.20)
GFR: 116.34 mL/min (ref >60.00)
GLUCOSE: 104 mg/dL — AB (ref 70–99)
POTASSIUM: 3.8 meq/L (ref 3.5–5.1)
SODIUM: 140 meq/L (ref 135–145)
Total Bilirubin: 0.5 mg/dL (ref 0.2–1.2)
Total Protein: 7.5 g/dL (ref 6.0–8.3)

## 2014-08-08 ENCOUNTER — Other Ambulatory Visit: Payer: Self-pay | Admitting: Family Medicine

## 2014-08-09 LAB — TESTOSTERONE, FREE, TOTAL, SHBG
SEX HORMONE BINDING: 25 nmol/L (ref 14–73)
TESTOSTERONE FREE: 13.9 pg/mL — AB (ref 0.6–6.8)
TESTOSTERONE-% FREE: 2.1 % (ref 0.4–2.4)
Testosterone: 66 ng/dL (ref 10–70)

## 2014-08-09 LAB — ACTH: C206 ACTH: 78 pg/mL — AB (ref 6–50)

## 2014-08-10 LAB — 17-HYDROXYPROGESTERONE: 17-OH-PROGESTERONE, LC/MS/MS: 2014 ng/dL — AB

## 2014-08-11 LAB — ALDOSTERONE + RENIN ACTIVITY W/ RATIO
ALDO / PRA Ratio: 1.8 Ratio (ref 0.9–28.9)
Aldosterone: 3 ng/dL
PRA LC/MS/MS: 1.67 ng/mL/h (ref 0.25–5.82)

## 2014-08-11 LAB — DHEA: DHEA: 27 ng/dL — ABNORMAL LOW (ref 102–1185)

## 2014-08-16 LAB — ANDROSTENEDIONE: ANDROSTENEDIONE: 10 ng/dL

## 2014-08-17 ENCOUNTER — Other Ambulatory Visit: Payer: Self-pay | Admitting: Physician Assistant

## 2014-08-20 ENCOUNTER — Other Ambulatory Visit: Payer: Self-pay | Admitting: *Deleted

## 2014-08-20 MED ORDER — HYDROCORTISONE 10 MG PO TABS: 15.0000 mg | ORAL_TABLET | Freq: Two times a day (BID) | ORAL | Status: DC

## 2014-08-23 ENCOUNTER — Encounter: Payer: Self-pay | Admitting: Internal Medicine

## 2014-11-08 ENCOUNTER — Ambulatory Visit (INDEPENDENT_AMBULATORY_CARE_PROVIDER_SITE_OTHER): Payer: Worker's Compensation | Admitting: Emergency Medicine

## 2014-11-08 VITALS — BP 124/82 | HR 91 | Temp 97.9°F | Resp 16 | Ht 61.0 in | Wt 250.2 lb

## 2014-11-08 DIAGNOSIS — S335XXA Sprain of ligaments of lumbar spine, initial encounter: Secondary | ICD-10-CM

## 2014-11-08 MED ORDER — NAPROXEN SODIUM 550 MG PO TABS: 550.0000 mg | ORAL_TABLET | Freq: Two times a day (BID) | ORAL | Status: DC

## 2014-11-08 MED ORDER — CYCLOBENZAPRINE HCL 10 MG PO TABS: 10.0000 mg | ORAL_TABLET | Freq: Three times a day (TID) | ORAL | Status: DC | PRN

## 2014-11-08 NOTE — Progress Notes (Signed)
[facility] [address], [place] [id number] [phone]  Date:  11/08/2014   Name:  Tracy Larson   DOB:  August 14, 1955   MRN:  [id number]  PCP:  Default, Provider, MD    Chief Complaint: Back Pain   History of Present Illness:  Tracy Larson is a 59 y.o. 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 No improvement with over the counter medications or other home remedies. Denies other complaint or health concern today.   Patient Active Problem List   Diagnosis Date Noted  . Morbid obesity 11/08/2014  . Arthritis 06/04/2014  . Congenital adrenal hyperplasia 02/21/2014    Past Medical History  Diagnosis Date  . Adrenal hyperplasia     History reviewed. No pertinent past surgical history.  History  Substance Use Topics  . Smoking status: Never Smoker   . Smokeless tobacco: Not on file  . Alcohol Use: No    History reviewed. No pertinent family history.  No Known Allergies  Medication list has been reviewed and updated.  Current Outpatient Prescriptions on File Prior to Visit  Medication Sig Dispense Refill  . Ginsengs-Royal Jelly (GINSENG COMPLEX/ROYAL JELLY PO) Take 800 mg by mouth 2 (two) times daily.    . hydrocortisone (CORTEF) 10 MG tablet Take 1.5 tablets (15 mg total) by mouth 2 (two) times daily. 360 tablet 1  . meloxicam (MOBIC) 15 MG tablet TAKE 1 TABLET DAILY WITH FOOD  (NO OTHER NSAIDS) 90 tablet 0  . traMADol (ULTRAM) 50 MG tablet Take 1 tablet (50 mg total) by mouth every 12 (twelve) hours as needed. For arthritic pain not controlled by Mobic. May cause constipation. Please use with stool softener. If you have seizures, it may lower  you seizure threshold. 30 tablet 0  . vitamin B-12 (CYANOCOBALAMIN) 500 MCG tablet Take 500 mcg by mouth daily.     No current facility-administered medications on file prior to visit.    Review of Systems:  Review of Systems  Constitutional: Negative for fever, chills and fatigue.  HENT: Negative for congestion, ear pain, hearing loss, postnasal drip, rhinorrhea and sinus pressure.   Eyes: Negative for discharge and redness.  Respiratory: Negative for cough, shortness of breath and wheezing.   Cardiovascular: Negative for chest pain and leg swelling.  Gastrointestinal: Negative for nausea, vomiting, abdominal pain, constipation and blood in stool.  Genitourinary: Negative for dysuria, urgency and frequency.  Musculoskeletal: Negative for neck stiffness.  Skin: Negative for rash.  Neurological: Negative for seizures, weakness and headaches.   Physical Examination: Filed Vitals:   11/08/14 1558  BP: 124/82  Pulse: 91  Temp: 97.9 F (36.6 C)  Resp: 16   Filed Vitals:   11/08/14 1558  Height: 5\' 1"  (1.549 m)  Weight: 250 lb 3.2 oz (113.49 kg)   Body mass index is 47.3 kg/(m^2). Ideal Body Weight: Weight in (lb) to have BMI = 25: 132  GEN: morbid obesity, NAD, Non-toxic, A & O x 3 HEENT: Atraumatic, Normocephalic. Neck supple. No masses, No LAD. Ears and Nose: No external deformity. CV: RRR, No M/G/R. No JVD. No thrill. No extra heart sounds. PULM: CTA B, no  wheezes, crackles, rhonchi. No retractions. No resp. distress. No accessory muscle use. ABD: S, NT, ND, +BS. No rebound. No HSM. EXTR: No c/c/e NEURO Normal gait.  PSYCH: Normally interactive. Conversant. Not depressed or anxious appearing.  Calm demeanor.  BACK  Tender left trapezius Tender both lumbosacral para spinous muscles  Assessment and Plan: 1. Morbid obesity Offered referral to bariatric   2. Sprain of lumbar region, initial encounter Anaprox Flexeril Hold mobic Local ice for two days then heating  pad Light duty for a week Follow up Sunday    Signed Phillips OdorJeffery Anderson, MD

## 2014-11-08 NOTE — Patient Instructions (Signed)

## 2014-11-10 ENCOUNTER — Emergency Department (HOSPITAL_COMMUNITY)

## 2014-11-10 ENCOUNTER — Emergency Department (HOSPITAL_COMMUNITY)
Admission: EM | Admit: 2014-11-10 | Discharge: 2014-11-10 | Disposition: A | Discharge status: Home / Self Care | Attending: Emergency Medicine | Admitting: Emergency Medicine

## 2014-11-10 ENCOUNTER — Encounter (HOSPITAL_COMMUNITY): Payer: Self-pay | Admitting: *Deleted

## 2014-11-10 DIAGNOSIS — Z79899 Other long term (current) drug therapy: Secondary | ICD-10-CM | POA: Insufficient documentation

## 2014-11-10 DIAGNOSIS — R109 Unspecified abdominal pain: Secondary | ICD-10-CM

## 2014-11-10 DIAGNOSIS — R0600 Dyspnea, unspecified: Secondary | ICD-10-CM | POA: Insufficient documentation

## 2014-11-10 DIAGNOSIS — Y990 Civilian activity done for income or pay: Secondary | ICD-10-CM | POA: Insufficient documentation

## 2014-11-10 DIAGNOSIS — Z791 Long term (current) use of non-steroidal anti-inflammatories (NSAID): Secondary | ICD-10-CM | POA: Insufficient documentation

## 2014-11-10 DIAGNOSIS — M545 Low back pain: Secondary | ICD-10-CM | POA: Diagnosis present

## 2014-11-10 DIAGNOSIS — R509 Fever, unspecified: Secondary | ICD-10-CM | POA: Diagnosis not present

## 2014-11-10 LAB — URINALYSIS, ROUTINE W REFLEX MICROSCOPIC
BILIRUBIN URINE: NEGATIVE
GLUCOSE, UA: NEGATIVE mg/dL
Ketones, ur: NEGATIVE mg/dL
Leukocytes, UA: NEGATIVE
NITRITE: NEGATIVE
PH: 6 (ref 5.0–8.0)
Protein, ur: NEGATIVE mg/dL
Specific Gravity, Urine: 1.024 (ref 1.005–1.030)
Urobilinogen, UA: 1 mg/dL (ref 0.0–1.0)

## 2014-11-10 LAB — COMPREHENSIVE METABOLIC PANEL
ALBUMIN: 3.7 g/dL (ref 3.5–5.0)
ALK PHOS: 84 U/L (ref 38–126)
ALT: 73 U/L — AB (ref 14–54)
ANION GAP: 10 (ref 5–15)
AST: 56 U/L — ABNORMAL HIGH (ref 15–41)
BUN: 14 mg/dL (ref 6–20)
CHLORIDE: 106 mmol/L (ref 101–111)
CO2: 27 mmol/L (ref 22–32)
Calcium: 9 mg/dL (ref 8.9–10.3)
Creatinine, Ser: 1.03 mg/dL — ABNORMAL HIGH (ref 0.44–1.00)
GFR calc Af Amer: >60 mL/min (ref >60)
GFR, EST NON AFRICAN AMERICAN: 59 mL/min — AB (ref >60)
Glucose, Bld: 100 mg/dL — ABNORMAL HIGH (ref 70–99)
POTASSIUM: 4 mmol/L (ref 3.5–5.1)
Sodium: 143 mmol/L (ref 135–145)
TOTAL PROTEIN: 8.3 g/dL — AB (ref 6.5–8.1)
Total Bilirubin: 0.6 mg/dL (ref 0.3–1.2)

## 2014-11-10 LAB — CBC WITH DIFFERENTIAL/PLATELET
Basophils Absolute: 0 10*3/uL (ref 0.0–0.1)
Basophils Relative: 0 % (ref 0–1)
EOS PCT: 2 % (ref 0–5)
Eosinophils Absolute: 0.1 10*3/uL (ref 0.0–0.7)
HCT: 43.6 % (ref 36.0–46.0)
HEMOGLOBIN: 13.6 g/dL (ref 12.0–15.0)
LYMPHS PCT: 19 % (ref 12–46)
Lymphs Abs: 1.9 10*3/uL (ref 0.7–4.0)
MCH: 27.5 pg (ref 26.0–34.0)
MCHC: 31.2 g/dL (ref 30.0–36.0)
MCV: 88.3 fL (ref 78.0–100.0)
MONO ABS: 0.3 10*3/uL (ref 0.1–1.0)
Monocytes Relative: 3 % (ref 3–12)
Neutro Abs: 7.4 10*3/uL (ref 1.7–7.7)
Neutrophils Relative %: 76 % (ref 43–77)
Platelets: 280 10*3/uL (ref 150–400)
RBC: 4.94 MIL/uL (ref 3.87–5.11)
RDW: 13.4 % (ref 11.5–15.5)
WBC: 9.7 10*3/uL (ref 4.0–10.5)

## 2014-11-10 LAB — I-STAT TROPONIN, ED: Troponin i, poc: 0 ng/mL (ref 0.00–0.08)

## 2014-11-10 LAB — URINE MICROSCOPIC-ADD ON

## 2014-11-10 LAB — D-DIMER, QUANTITATIVE (NOT AT ARMC): D DIMER QUANT: 0.48 ug{FEU}/mL (ref 0.00–0.48)

## 2014-11-10 MED ORDER — OXYCODONE-ACETAMINOPHEN 5-325 MG PO TABS
1.0000 | ORAL_TABLET | Freq: Once | ORAL | Status: AC
  Administered 2014-11-10: 1 via ORAL
  Filled 2014-11-10: qty 1

## 2014-11-10 MED ORDER — DIAZEPAM 5 MG PO TABS: 5.0000 mg | ORAL_TABLET | Freq: Two times a day (BID) | ORAL | Status: DC

## 2014-11-10 MED ORDER — DIAZEPAM 5 MG PO TABS
5.0000 mg | ORAL_TABLET | Freq: Once | ORAL | Status: DC
  Filled 2014-11-10: qty 1

## 2014-11-10 MED ORDER — IBUPROFEN 800 MG PO TABS: 800.0000 mg | ORAL_TABLET | Freq: Three times a day (TID) | ORAL | Status: DC

## 2014-11-10 MED ORDER — HYDROMORPHONE HCL 1 MG/ML IJ SOLN
1.0000 mg | Freq: Once | INTRAMUSCULAR | Status: AC
  Administered 2014-11-10: 1 mg via INTRAMUSCULAR
  Filled 2014-11-10: qty 1

## 2014-11-10 MED ORDER — KETOROLAC TROMETHAMINE 30 MG/ML IJ SOLN
30.0000 mg | Freq: Once | INTRAMUSCULAR | Status: AC
  Administered 2014-11-10: 30 mg via INTRAMUSCULAR
  Filled 2014-11-10: qty 1

## 2014-11-10 MED ORDER — IBUPROFEN 800 MG PO TABS: 800.0000 mg | ORAL_TABLET | Freq: Three times a day (TID) | ORAL | Status: AC

## 2014-11-10 MED ORDER — OXYCODONE-ACETAMINOPHEN 5-325 MG PO TABS: 1.0000 | ORAL_TABLET | Freq: Four times a day (QID) | ORAL | Status: DC | PRN

## 2014-11-10 NOTE — ED Notes (Signed)
Lab results of I-Stat Troponin was 0.00.

## 2014-11-10 NOTE — ED Provider Notes (Signed)
CSN: [id number]     Arrival date & time 11/10/14  1147 History   First MD Initiated Contact with Patient 11/10/14 1544     Chief Complaint  Patient presents with  . Back Pain  . Shortness of Breath     (Consider location/radiation/quality/duration/timing/severity/associated sxs/prior Treatment) HPI Patient presents with concern of back pain, fever, dyspnea. Patient has history of adrenal hyperplasia, otherwise is generally well aside from obesity. Patient does not smoke, does not drink. 4 days ago the patient was lifting a patient, at work, felt acute onset of pain in the mid lower back. Since that time there's been persistent pain, sharp. Patient saw her physician following day, was provided medication, has had some relief with these. Today the patient also began having dyspnea, without associated back pain, or chest pain. Dyspnea is worse with exertion. No new fever today, though the patient had fever in the past few days. No chills, vomiting, diarrhea, urinary complaints, vaginal complaint sprayed  Past Medical History  Diagnosis Date  . Adrenal hyperplasia    History reviewed. No pertinent past surgical history. No family history on file. History  Substance Use Topics  . Smoking status: Never Smoker   . Smokeless tobacco: Not on file  . Alcohol Use: No   OB History    No data available     Review of Systems  Constitutional:       Per HPI, otherwise negative  HENT:       Per HPI, otherwise negative  Respiratory:       Per HPI, otherwise negative  Cardiovascular:       Per HPI, otherwise negative  Gastrointestinal: Negative for vomiting.  Endocrine:       Negative aside from HPI  Genitourinary:       Neg aside from HPI   Musculoskeletal:       Per HPI, otherwise negative  Skin: Negative.   Neurological: Negative for syncope and weakness.      Allergies  Review of patient's allergies indicates no known allergies.  Home Medications   Prior to  Admission medications   Medication Sig Start Date End Date Taking? Authorizing Provider  cyclobenzaprine (FLEXERIL) 10 MG tablet Take 1 tablet (10 mg total) by mouth 3 (three) times daily as needed for muscle spasms. 11/08/14  Yes Carmelina Dane, MD  Ginsengs-Royal Jelly (GINSENG COMPLEX/ROYAL JELLY PO) Take 800 mg by mouth 2 (two) times daily.   Yes Historical Provider, MD  meloxicam (MOBIC) 15 MG tablet TAKE 1 TABLET DAILY WITH FOOD  (NO OTHER NSAIDS) 08/19/14  Yes Thao P Le, DO  naproxen sodium (ANAPROX DS) 550 MG tablet Take 1 tablet (550 mg total) by mouth 2 (two) times daily with a meal. 11/08/14 11/08/15 Yes Carmelina Dane, MD  vitamin B-12 (CYANOCOBALAMIN) 500 MCG tablet Take 500 mcg by mouth daily.   Yes Historical Provider, MD  hydrocortisone (CORTEF) 10 MG tablet Take 1.5 tablets (15 mg total) by mouth 2 (two) times daily. Patient not taking: Reported on 11/10/2014 08/20/14   [name], MD  traMADol (ULTRAM) 50 MG tablet Take 1 tablet (50 mg total) by mouth every 12 (twelve) hours as needed. For arthritic pain not controlled by Mobic. May cause constipation. Please use with stool softener. If you have seizures, it may lower you seizure threshold. Patient not taking: Reported on 11/10/2014 06/04/14   Thao P Le, DO   BP 139/90 mmHg  Pulse 114  Temp(Src) 98.2 F (36.8 C) (Oral)  Resp  22  SpO2 98% Physical Exam  Constitutional: She is oriented to person, place, and time. She appears well-developed and well-nourished. No distress.  Morbidly obese female sitting upright, speaking clearly  HENT:  Head: Normocephalic and atraumatic.  Eyes: Conjunctivae and EOM are normal.  Cardiovascular: Normal rate and regular rhythm.   Pulses are appreciable in 4 extremities  Pulmonary/Chest: Effort normal and breath sounds normal. No stridor. No respiratory distress.  Abdominal: She exhibits no distension.  Musculoskeletal: She exhibits no edema.  Tenderness to palpation in the mid lumbar  region, without deformity. Patient can flex each hip independently, though there is associated mid low back pain with flexion.  Neurological: She is alert and oriented to person, place, and time. No cranial nerve deficit.  Skin: Skin is warm and dry.  Psychiatric: She has a normal mood and affect.  Nursing note and vitals reviewed.   ED Course  Procedures (including critical care time) Labs Review Labs Reviewed  COMPREHENSIVE METABOLIC PANEL - Abnormal; Notable for the following:    Glucose, Bld 100 (*)    Creatinine, Ser 1.03 (*)    Total Protein 8.3 (*)    AST 56 (*)    ALT 73 (*)    GFR calc non Af Amer 59 (*)    All other components within normal limits  URINALYSIS, ROUTINE W REFLEX MICROSCOPIC - Abnormal; Notable for the following:    Hgb urine dipstick SMALL (*)    All other components within normal limits  D-DIMER, QUANTITATIVE  CBC WITH DIFFERENTIAL/PLATELET  URINE MICROSCOPIC-ADD ON  Rosezena SensorI-STAT TROPOININ, ED    Imaging Review Ct Renal Stone Study  11/10/2014   CLINICAL DATA:  Back pain and shortness of breath. Injury to the back on Sunday. Patient was seen in given medications. Continued pain. Now with shortness of breath.  EXAM: CT ABDOMEN AND PELVIS WITHOUT CONTRAST  TECHNIQUE: Multidetector CT imaging of the abdomen and pelvis was performed following the standard protocol without IV contrast.  COMPARISON:  None.  FINDINGS: Scarring in the right lung base.  The unenhanced appearance of the liver, spleen, pancreas, adrenal glands, kidneys, abdominal aorta, inferior vena cava, and retroperitoneal lymph nodes is unremarkable. Small accessory spleen. No renal or ureteral stones. No hydronephrosis or hydroureter. Stomach, small bowel, and colon are mostly decompressed. Scattered stool in the colon. Small periumbilical hernia containing fat.  Pelvis: The appendix is normal. Uterus and ovaries are not enlarged. No bladder wall thickening. No pelvic mass or lymphadenopathy. No free or  loculated pelvic fluid collections. There is a lipoma in the left piriformis muscle. Mild spondylolisthesis at L4-5 with associated degenerative changes. Degenerative disc disease at L4-5. Hypertrophic degenerative changes throughout the visualized spine. Degenerative changes in the SI joints and hips. No acute bony abnormalities appreciated.  IMPRESSION: Degenerative changes in the lower lumbar spine and hips. Small periumbilical hernia containing fat. No acute process identified in the abdomen or pelvis.   Electronically Signed   By: Burman NievesWilliam  Stevens M.D.   On: 11/10/2014 21:17     EKG Interpretation   Date/Time:  Wednesday Nov 10 2014 12:11:23 EDT Ventricular Rate:  91 PR Interval:  142 QRS Duration: 74 QT Interval:  346 QTC Calculation: 425 R Axis:   45 Text Interpretation:  Normal sinus rhythm Normal ECG Sinus rhythm T wave  abnormality Borderline ECG Confirmed by Gerhard MunchLOCKWOOD, Azelyn Batie  MD ([id number]) on  11/10/2014 4:33:19 PM     Pulse oximetry 99% room air normal  Cardiac: 121 - st, abnormal  6:34 PM On repeat exam the patient remains in similar condition, though with notable pain. Given hematuria, persistent back pain, no history of kidney stones, CT scan will be performed.   I reviewed the results (including imaging as performed), agree with the interpretation  On repeat exam the patient appears better.  We reviewed all findings.  MDM   Final diagnoses:  Flank pain   patient presents with concern of ongoing flank pain. Patient is generally well female, presenting with pain not responsive to typical medication. Here the patient had evaluation included labs, CT, after she was found to have hematuria, and has no history of kidney stone. Labs, CT, largely reassuring, pain improved, vital signs remain stable. No evidence for acute abdominal processes, other occult infection. Patient had been here, was discharged with course of analgesics to follow-up with primary care in 4/5  days.  Gerhard Munchobert Tonae Livolsi, MD 11/10/14 2141

## 2014-11-10 NOTE — ED Notes (Signed)
Pt states that she had an injury to her back on Sunday. Pt states that she was seen and given medications. Pt states that the medications are not working. Pt states that she began having SOB today that is worse with exertion. Pt denies chest pain.

## 2014-11-10 NOTE — Discharge Instructions (Signed)
As discussed, your evaluation tonight has been largely reassuring. The pain is likely coming from an acute strain of the musculature in your back. Please take all medication as directed, monitor your condition carefully, and do not hesitate to return for concerning changes in your condition.  Otherwise, please be sure to be seen by your physician early next week to ensure resolution, and/or improvement of your condition.

## 2014-11-11 ENCOUNTER — Ambulatory Visit: Payer: Worker's Compensation

## 2014-11-11 ENCOUNTER — Ambulatory Visit (INDEPENDENT_AMBULATORY_CARE_PROVIDER_SITE_OTHER): Payer: Worker's Compensation | Admitting: Family Medicine

## 2014-11-11 VITALS — BP 130/88 | HR 87 | Temp 98.0°F | Resp 12 | Ht 61.0 in | Wt 246.0 lb

## 2014-11-11 DIAGNOSIS — M545 Low back pain, unspecified: Secondary | ICD-10-CM

## 2014-11-11 DIAGNOSIS — M546 Pain in thoracic spine: Secondary | ICD-10-CM

## 2014-11-11 DIAGNOSIS — T148 Other injury of unspecified body region: Secondary | ICD-10-CM | POA: Diagnosis not present

## 2014-11-11 DIAGNOSIS — Z7952 Long term (current) use of systemic steroids: Secondary | ICD-10-CM | POA: Diagnosis not present

## 2014-11-11 DIAGNOSIS — R748 Abnormal levels of other serum enzymes: Secondary | ICD-10-CM | POA: Diagnosis not present

## 2014-11-11 DIAGNOSIS — T148XXA Other injury of unspecified body region, initial encounter: Secondary | ICD-10-CM

## 2014-11-11 MED ORDER — TIZANIDINE HCL 4 MG PO TABS: 4.0000 mg | ORAL_TABLET | Freq: Four times a day (QID) | ORAL | Status: DC | PRN

## 2014-11-11 MED ORDER — OXYCODONE HCL 5 MG PO TABS: ORAL_TABLET | ORAL | Status: DC

## 2014-11-11 NOTE — Progress Notes (Signed)
 Chief Complaint:  Chief Complaint  Patient presents with  . Back Pain    no improvement x 4 days    HPI: Tracy Larson is a 59 y.o. female who is here for:  Recheck of work comp back injury after lifting up patient. She is in a lot of pain,10/10 depending on certainmovements, her BP has been elevated because of it. She is here for recheck of her work comp injury. She continues to have mid upper and mid lower back pain. It is significantly painful. She is already taking Valium and Roxicet. She has not taken any ibuprofen. She is able to urinate without any incontinence issues. + n/t/no Tracy FileweaknessShe has a history of congenital adrenal hyperplasia and has been on hydrocortisone medications for most of her adult life. There is no history of osteoporosis but I'm not able to pull up the bone density scan. She has some shortness of breath when she is walking or exerting herself. She is in 7 out of 10 pain without range of motion. But on palpation she has worsening pain. She cannot move very well. She has numbness and tingling in her hands but this is a prior issue.   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 No improvement with over the counter medications or other home remedies. Denies other complaint or health concern today.   Past Medical History  Diagnosis Date  . Adrenal hyperplasia    No past surgical history on file. History   Social History  . Marital Status: Married    Spouse Name: [name]  . Number of Children: [name]  . Years of Education: [name]   Social History Main Topics  . Smoking status: Never Smoker   . Smokeless tobacco: Not on file  . Alcohol Use: No  . Drug Use: No  . Sexual Activity: Not on file   Other  Topics Concern  . None   Social History Narrative   No family history on file. No Known Allergies Prior to Admission medications   Medication Sig Start Date End Date Taking? Authorizing Provider  Ginsengs-Royal Jelly (GINSENG COMPLEX/ROYAL JELLY PO) Take 800 mg by mouth 2 (two) times daily.   Yes Historical Provider, MD  hydrocortisone (CORTEF) 10 MG tablet Take 10 mg by mouth daily.   Yes Historical Provider, MD  oxyCODONE-acetaminophen (PERCOCET/ROXICET) 5-325 MG per tablet Take 1 tablet by mouth every 6 (six) hours as needed for severe pain. 11/10/14  Yes Gerhard Munchobert Lockwood, MD  vitamin B-12 (CYANOCOBALAMIN) 500 MCG tablet Take 500 mcg by mouth daily.   Yes Historical Provider, MD  diazepam (VALIUM) 5 MG tablet Take 1 tablet (5 mg total) by mouth 2 (two) times daily. Patient not taking: Reported on 11/11/2014 11/10/14 11/13/14  Gerhard Munchobert Lockwood, MD  ibuprofen (ADVIL,MOTRIN) 800 MG tablet Take 1 tablet (800 mg total) by mouth 3 (three) times daily. Patient not taking: Reported on 11/11/2014 11/10/14 11/13/14  Gerhard Munchobert Lockwood, MD     ROS: The patient denies fevers, chills, night sweats, unintentional weight loss, chest pain, palpitations, wheezing, dyspnea on exertion, nausea, vomiting, abdominal pain, dysuria, hematuria, melena.  All other systems have been reviewed and were otherwise negative with the exception of  those mentioned in the HPI and as above.    PHYSICAL EXAM: Filed Vitals:   11/11/14 1116  BP: 130/88  Pulse: 87  Temp: 98 F (36.7 C)  Resp: 12   Filed Vitals:   11/11/14 1116  Height: 5\' 1"  (1.549 m)  Weight: 246 lb (111.585 kg)   Body mass index is 46.51 kg/(m^2).  General: Alert, no acute distress, morbidly obese female HEENT:  Normocephalic, atraumatic, oropharynx patent. EOMI, PERRLA Cardiovascular:  Regular rate and rhythm, no rubs murmurs or gallops.  No Carotid bruits, radial pulse intact. No pedal edema.  Respiratory: Clear to auscultation bilaterally.  No  wheezes, rales, or rhonchi.  No cyanosis, no use of accessory musculature GI: No organomegaly, abdomen is soft and non-tender, positive bowel sounds.  No masses. Skin: No rashes. Neurologic: Facial musculature symmetric. Psychiatric: Patient is appropriate throughout our interaction. Lymphatic: No cervical lymphadenopathy Musculoskeletal: Gait intact. + paramsk tenderness  Decrease ROM, very poor exam since she is so tender.  5/5 strength, 2/2 DTRs in  bilaterally No saddle anesthesia Straight leg +/- Hip and knee exam--normal    LABS: Results for orders placed or performed during the hospital encounter of 11/10/14  Comprehensive metabolic panel  Result Value Ref Range   Sodium 143 135 - 145 mmol/L   Potassium 4.0 3.5 - 5.1 mmol/L   Chloride 106 101 - 111 mmol/L   CO2 27 22 - 32 mmol/L   Glucose, Bld 100 (H) 70 - 99 mg/dL   BUN 14 6 - 20 mg/dL   Creatinine, Ser 1.611.03 (H) 0.44 - 1.00 mg/dL   Calcium 9.0 8.9 - 09.610.3 mg/dL   Total Protein 8.3 (H) 6.5 - 8.1 g/dL   Albumin 3.7 3.5 - 5.0 g/dL   AST 56 (H) 15 - 41 U/L   ALT 73 (H) 14 - 54 U/L   Alkaline Phosphatase 84 38 - 126 U/L   Total Bilirubin 0.6 0.3 - 1.2 mg/dL   GFR calc non Af Amer 59 (L) >60 mL/min   GFR calc Af Amer >60 >60 mL/min   Anion gap 10 5 - 15  D-dimer, quantitative  Result Value Ref Range   D-Dimer, Quant 0.48 0.00 - 0.48 ug/mL-FEU  CBC with Differential  Result Value Ref Range   WBC 9.7 4.0 - 10.5 K/uL   RBC 4.94 3.87 - 5.11 MIL/uL   Hemoglobin 13.6 12.0 - 15.0 g/dL   HCT 04.543.6 40.936.0 - 81.146.0 %   MCV 88.3 78.0 - 100.0 fL   MCH 27.5 26.0 - 34.0 pg   MCHC 31.2 30.0 - 36.0 g/dL   RDW 91.413.4 78.211.5 - 95.615.5 %   Platelets 280 150 - 400 K/uL   Neutrophils Relative % 76 43 - 77 %   Neutro Abs 7.4 1.7 - 7.7 K/uL   Lymphocytes Relative 19 12 - 46 %   Lymphs Abs 1.9 0.7 - 4.0 K/uL   Monocytes Relative 3 3 - 12 %   Monocytes Absolute 0.3 0.1 - 1.0 K/uL   Eosinophils Relative 2 0 - 5 %   Eosinophils Absolute 0.1 0.0  - 0.7 K/uL   Basophils Relative 0 0 - 1 %   Basophils Absolute 0.0 0.0 - 0.1 K/uL  Urinalysis, Routine w reflex microscopic  Result Value Ref Range   Color, Urine YELLOW YELLOW   APPearance CAR CAR   Specific Gravity, Urine 1.024 1.005 - 1.030   pH 6.0 5.0 - 8.0   Glucose, UA NEGATIVE NEGATIVE mg/dL  Hgb urine dipstick SMALL (A) NEGATIVE   Bilirubin Urine NEGATIVE NEGATIVE   Ketones, ur NEGATIVE NEGATIVE mg/dL   Protein, ur NEGATIVE NEGATIVE mg/dL   Urobilinogen, UA 1.0 0.0 - 1.0 mg/dL   Nitrite NEGATIVE NEGATIVE   Leukocytes, UA NEGATIVE NEGATIVE  Urine microscopic-add on  Result Value Ref Range   Squamous Epithelial / LPF RARE RARE   WBC, UA 0-2 <3 WBC/hpf   RBC / HPF 3-6 <3 RBC/hpf   Bacteria, UA RARE RARE  I-stat troponin, ED  (not at [facility], [facility])  Result Value Ref Range   Troponin i, poc 0.00 0.00 - 0.08 ng/mL   Comment 3             EKG/XRAY:   Primary read interpreted by Dr. Conley Rolls at [facility]. Please comment  if T8-T9 compression fracture versus DJD   ASSESSMENT/PLAN: Encounter Diagnoses  Name Primary?  . Midline thoracic back pain Yes  . Midline low back pain without sciatica   . Current use of steroid medication   . Sprain and strain   . Elevated liver enzymes    Stop roxicet, valium since making her sleepy Rx oxycodone IR since cannot take tylenol  Change valium to zanaflex She needs a bone density study at some point with PCP due to hx of chronic steroid use Fu in 1 week, work note given  Gross sideeffects, risk and benefits, and alternatives of medications d/w patient. Patient is aware that all medications have potential sideeffects and we are unable to predict every sideeffect or drug-drug interaction that may occur.  ,  PHUONG, DO 11/16/2014 9:16 AM

## 2014-11-16 ENCOUNTER — Other Ambulatory Visit: Payer: Self-pay | Admitting: Family Medicine

## 2014-11-17 ENCOUNTER — Ambulatory Visit (INDEPENDENT_AMBULATORY_CARE_PROVIDER_SITE_OTHER): Admitting: Family Medicine

## 2014-11-17 VITALS — BP 108/70 | HR 97 | Temp 97.8°F | Resp 16 | Ht 61.0 in | Wt 246.0 lb

## 2014-11-17 DIAGNOSIS — M546 Pain in thoracic spine: Secondary | ICD-10-CM | POA: Diagnosis not present

## 2014-11-17 DIAGNOSIS — M545 Low back pain, unspecified: Secondary | ICD-10-CM

## 2014-11-17 DIAGNOSIS — N393 Stress incontinence (female) (male): Secondary | ICD-10-CM | POA: Diagnosis not present

## 2014-11-17 DIAGNOSIS — R32 Unspecified urinary incontinence: Secondary | ICD-10-CM

## 2014-11-17 DIAGNOSIS — T148 Other injury of unspecified body region: Secondary | ICD-10-CM

## 2014-11-17 DIAGNOSIS — R748 Abnormal levels of other serum enzymes: Secondary | ICD-10-CM | POA: Diagnosis not present

## 2014-11-17 DIAGNOSIS — T148XXA Other injury of unspecified body region, initial encounter: Secondary | ICD-10-CM

## 2014-11-17 DIAGNOSIS — M1990 Unspecified osteoarthritis, unspecified site: Secondary | ICD-10-CM | POA: Diagnosis not present

## 2014-11-17 MED ORDER — CYCLOBENZAPRINE HCL 10 MG PO TABS: 10.0000 mg | ORAL_TABLET | Freq: Three times a day (TID) | ORAL | Status: DC | PRN

## 2014-11-17 MED ORDER — DIAZEPAM 5 MG PO TABS: 5.0000 mg | ORAL_TABLET | Freq: Two times a day (BID) | ORAL | Status: DC | PRN

## 2014-11-17 MED ORDER — OXYCODONE HCL 10 MG PO TABS: 10.0000 mg | ORAL_TABLET | Freq: Three times a day (TID) | ORAL | Status: DC | PRN

## 2014-11-17 NOTE — Progress Notes (Signed)
Chief Complaint:  Chief Complaint  Patient presents with  . Follow-up    workers comp    HPI: Tracy Larson is a 59 y.o. female who is here for recheck of her back pain, she is still having 10/10 pain, has some inconctinece bt no saddle anesthesia.  She stated that she was having this even on our last visit but did not state that she was having incontinence. She dribbles urine only but has no bowel incontinence. She did not have this before her injury. She has not been able to move for get out of bed. She tried to medicate shin that I given her. I have given her Zanaflex and also change her Percocet to strictly oxycodone since her liver enzymes were elevated. She was having so much pain that she stopped taking the Zanaflex, went back to the Flexeril which I had originally discontinued because it was making her drowsy. She restarted on the Valium. She also has been doubling up on her oxycodone and Percocet if she needs it. This eases some of the pain so that she can actually walk. Her last x-rays were negative for any acute changes. She does have significant lumbar DJD.  IMPRESSION: Multilevel lumbar disc and facet degeneration, greatest at L4-5 where there is severe facet arthrosis grade 1 anterolisthesis.   Electronically Signed  By: Sebastian AcheAllen Grady  On: 11/11/2014 13:21  Past Medical History  Diagnosis Date  . Adrenal hyperplasia    History reviewed. No pertinent past surgical history. History   Social History  . Marital Status: Married    Spouse Name: [name]  . Number of Children: [name]  . Years of Education: [name]   Social History Main Topics  . Smoking status: Never Smoker   . Smokeless tobacco: Not on file  . Alcohol Use: No  . Drug Use: No  . Sexual Activity: Not on file   Other Topics Concern  . None   Social History Narrative   History reviewed. No pertinent family history. No Known Allergies Prior to Admission medications   Medication Sig Start Date  End Date Taking? Authorizing Provider  Ginsengs-Royal Jelly (GINSENG COMPLEX/ROYAL JELLY PO) Take 800 mg by mouth 2 (two) times daily.   Yes Historical Provider, MD  hydrocortisone (CORTEF) 10 MG tablet Take 10 mg by mouth daily.   Yes Historical Provider, MD  vitamin B-12 (CYANOCOBALAMIN) 500 MCG tablet Take 500 mcg by mouth daily.   Yes Historical Provider, MD  oxyCODONE (OXY IR/ROXICODONE) 5 MG immediate release tablet 1/2 to 1 tab po every 6 hours as needed for pain, take with stool softener. Stop taking  Percocet or Roxicet. Patient not taking: Reported on 11/17/2014 11/11/14   Myra Weng P Hank Walling, DO  tiZANidine (ZANAFLEX) 4 MG tablet Take 1 tablet (4 mg total) by mouth every 6 (six) hours as needed for muscle spasms. Patient not taking: Reported on 11/17/2014 11/11/14   Sebastian Lurz P Samanvitha Germany, DO     ROS: The patient denies fevers, chills, night sweats, unintentional weight loss, chest pain, palpitations, wheezing, dyspnea on exertion, nausea, vomiting, abdominal pain, dysuria, hematuria, melena  All other systems have been reviewed and were otherwise negative with the exception of those mentioned in the HPI and as above.    PHYSICAL EXAM: Filed Vitals:   11/17/14 1606  BP: 108/70  Pulse: 97  Temp: 97.8 F (36.6 C)  Resp: 16   Filed Vitals:   11/17/14 1606  Height: 5\' 1"  (1.549 m)  Weight: 246  lb (111.585 kg)   Body mass index is 46.51 kg/(m^2).  General: Alert, moderate acute distress, morbidly obese African-American female HEENT:  Normocephalic, atraumatic, oropharynx patent. EOMI, PERRLA Cardiovascular:  Regular rate and rhythm, no rubs murmurs or gallops.  No Carotid bruits, radial pulse intact. No pedal edema.  Respiratory: Clear to auscultation bilaterally.  No wheezes, rales, or rhonchi.  No cyanosis, no use of accessory musculature GI: No organomegaly, abdomen is soft and non-tender, positive bowel sounds.  No masses. Skin: No rashes. Neurologic: Facial musculature symmetric. Psychiatric:  Patient is appropriate throughout our interaction. Musculoskeletal: Gait antalgic Negative for saddle anesthesia 5 out of 5 dorsi and plantar flexion of the feet Brisk ankle DTRs +2  Lumbar spine is very tender even on minimal palpation. She is a very poor lumbar spine exam since she is not able to do any range of motion for me.   LABS: Results for orders placed or performed during the hospital encounter of 11/10/14  Comprehensive metabolic panel  Result Value Ref Range   Sodium 143 135 - 145 mmol/L   Potassium 4.0 3.5 - 5.1 mmol/L   Chloride 106 101 - 111 mmol/L   CO2 27 22 - 32 mmol/L   Glucose, Bld 100 (H) 70 - 99 mg/dL   BUN 14 6 - 20 mg/dL   Creatinine, Ser 1.61 (H) 0.44 - 1.00 mg/dL   Calcium 9.0 8.9 - 09.6 mg/dL   Total Protein 8.3 (H) 6.5 - 8.1 g/dL   Albumin 3.7 3.5 - 5.0 g/dL   AST 56 (H) 15 - 41 U/L   ALT 73 (H) 14 - 54 U/L   Alkaline Phosphatase 84 38 - 126 U/L   Total Bilirubin 0.6 0.3 - 1.2 mg/dL   GFR calc non Af Amer 59 (L) >60 mL/min   GFR calc Af Amer >60 >60 mL/min   Anion gap 10 5 - 15  D-dimer, quantitative  Result Value Ref Range   D-Dimer, Quant 0.48 0.00 - 0.48 ug/mL-FEU  CBC with Differential  Result Value Ref Range   WBC 9.7 4.0 - 10.5 K/uL   RBC 4.94 3.87 - 5.11 MIL/uL   Hemoglobin 13.6 12.0 - 15.0 g/dL   HCT 04.5 40.9 - 81.1 %   MCV 88.3 78.0 - 100.0 fL   MCH 27.5 26.0 - 34.0 pg   MCHC 31.2 30.0 - 36.0 g/dL   RDW 91.4 78.2 - 95.6 %   Platelets 280 150 - 400 K/uL   Neutrophils Relative % 76 43 - 77 %   Neutro Abs 7.4 1.7 - 7.7 K/uL   Lymphocytes Relative 19 12 - 46 %   Lymphs Abs 1.9 0.7 - 4.0 K/uL   Monocytes Relative 3 3 - 12 %   Monocytes Absolute 0.3 0.1 - 1.0 K/uL   Eosinophils Relative 2 0 - 5 %   Eosinophils Absolute 0.1 0.0 - 0.7 K/uL   Basophils Relative 0 0 - 1 %   Basophils Absolute 0.0 0.0 - 0.1 K/uL  Urinalysis, Routine w reflex microscopic  Result Value Ref Range   Color, Urine YELLOW YELLOW   APPearance CLEAR CLEAR    Specific Gravity, Urine 1.024 1.005 - 1.030   pH 6.0 5.0 - 8.0   Glucose, UA NEGATIVE NEGATIVE mg/dL   Hgb urine dipstick SMALL (A) NEGATIVE   Bilirubin Urine NEGATIVE NEGATIVE   Ketones, ur NEGATIVE NEGATIVE mg/dL   Protein, ur NEGATIVE NEGATIVE mg/dL   Urobilinogen, UA 1.0 0.0 - 1.0 mg/dL  Nitrite NEGATIVE NEGATIVE   Leukocytes, UA NEGATIVE NEGATIVE  Urine microscopic-add on  Result Value Ref Range   Squamous Epithelial / LPF RARE RARE   WBC, UA 0-2 <3 WBC/hpf   RBC / HPF 3-6 <3 RBC/hpf   Bacteria, UA RARE RARE  I-stat troponin, ED  ([facility] at [facility])  Result Value Ref Range   Troponin i, poc 0.00 0.00 - 0.08 ng/mL   Comment 3             EKG/XRAY:   Primary read interpreted by [facility]Le at [facility].   ASSESSMENT/PLAN: Encounter Diagnoses  Name Primary?  . Midline thoracic back pain Yes  . Midline low back pain without sciatica   . Sprain and strain   . Arthritis   . Abnormal liver enzymes   . Incontinence in female     59 year old African-American female with a past medical history of morbid obesity, congenital adrenal hyperplasia on chronic steroids,, DJD, abnormal liver enzymes presents for recheck of her low back sprain/strain and also a new complaint of urinary incontinence. She does not have saddle anesthesia on exam. She refuses to have a digital rectal exam done. He states the incontinence has been for the last week or more. I will change her medications: Discontinue Zanaflex, refill Flexeril. Increase oxycodone to 10 mg 3 times a day when necessary with precautions for abuse/addiction/drowsiness/constipation. Advised to take with stool softener. Refill Valium 5 mg by mouth twice a day when necessary only if center flex and oxycodone does not help with pain relief. Stat MRI of the L-spine with and without contrast in the morning. She declines to go get an MRI today. She states she is into much pain for anything to touch her and also for her to lay down without being  medicated. Fu in the AM. Precautions  given to go to ER prn   Gross sideeffects, risk and benefits, and alternatives of medications d/w patient. Patient is aware that all medications have potential sideeffects and we are unable to predict every sideeffect or drug-drug interaction that may occur.  Hamilton CapriLE, Jabe Jeanbaptiste PHUONG, DO 11/17/2014 5:19 PM

## 2014-11-18 ENCOUNTER — Other Ambulatory Visit: Payer: Self-pay | Admitting: Family Medicine

## 2014-11-18 DIAGNOSIS — M545 Low back pain, unspecified: Secondary | ICD-10-CM

## 2014-11-18 DIAGNOSIS — R32 Unspecified urinary incontinence: Secondary | ICD-10-CM

## 2014-11-23 ENCOUNTER — Telehealth: Payer: Self-pay | Admitting: Internal Medicine

## 2014-11-23 NOTE — Telephone Encounter (Signed)
Please read message below and advise.  

## 2014-11-23 NOTE — Telephone Encounter (Signed)
I am not sure what she has in mind...   Higher adrenaline levels are expected during pain epsodes.Treatment is to treat the pain.   Alternatively, there is no medical condition associated to low adrenaline levels >> I believe the test is not needed.

## 2014-11-23 NOTE — Telephone Encounter (Signed)
Pt has seen Dr.Gherghe one time. She is wanting to make an appt with us again for us to check her adrenaline levels, she thinks her adrenal levels are being affected by her back pain that is covered under workers comp. In last note nothing was stated for FU by Dr. Elvera LennoxGherghe, please advise

## 2014-11-24 NOTE — Telephone Encounter (Signed)
Called pt and advised her per Dr Gherghe's message below. Pt voiced understanding.  

## 2014-11-25 ENCOUNTER — Ambulatory Visit: Payer: Worker's Compensation | Admitting: Internal Medicine

## 2014-11-27 ENCOUNTER — Emergency Department (HOSPITAL_COMMUNITY)

## 2014-11-27 ENCOUNTER — Observation Stay (HOSPITAL_COMMUNITY)
Admission: EM | Admit: 2014-11-27 | Discharge: 2014-11-30 | Disposition: A | Discharge status: Home / Self Care | Attending: Family Medicine | Admitting: Family Medicine

## 2014-11-27 ENCOUNTER — Ambulatory Visit (INDEPENDENT_AMBULATORY_CARE_PROVIDER_SITE_OTHER): Admitting: Family Medicine

## 2014-11-27 ENCOUNTER — Encounter (HOSPITAL_COMMUNITY): Payer: Self-pay | Admitting: *Deleted

## 2014-11-27 VITALS — BP 104/76 | HR 60 | Temp 98.3°F | Resp 18 | Ht 60.0 in | Wt 246.6 lb

## 2014-11-27 DIAGNOSIS — R000 Tachycardia, unspecified: Secondary | ICD-10-CM | POA: Insufficient documentation

## 2014-11-27 DIAGNOSIS — Z87828 Personal history of other (healed) physical injury and trauma: Secondary | ICD-10-CM | POA: Insufficient documentation

## 2014-11-27 DIAGNOSIS — M549 Dorsalgia, unspecified: Principal | ICD-10-CM | POA: Diagnosis present

## 2014-11-27 DIAGNOSIS — E250 Congenital adrenogenital disorders associated with enzyme deficiency: Secondary | ICD-10-CM | POA: Diagnosis not present

## 2014-11-27 DIAGNOSIS — M4806 Spinal stenosis, lumbar region: Secondary | ICD-10-CM | POA: Insufficient documentation

## 2014-11-27 DIAGNOSIS — M1990 Unspecified osteoarthritis, unspecified site: Secondary | ICD-10-CM | POA: Diagnosis not present

## 2014-11-27 DIAGNOSIS — R32 Unspecified urinary incontinence: Secondary | ICD-10-CM | POA: Diagnosis not present

## 2014-11-27 DIAGNOSIS — Z79899 Other long term (current) drug therapy: Secondary | ICD-10-CM | POA: Insufficient documentation

## 2014-11-27 DIAGNOSIS — Z6841 Body Mass Index (BMI) 40.0 and over, adult: Secondary | ICD-10-CM | POA: Diagnosis not present

## 2014-11-27 DIAGNOSIS — M546 Pain in thoracic spine: Secondary | ICD-10-CM | POA: Insufficient documentation

## 2014-11-27 DIAGNOSIS — M545 Low back pain: Secondary | ICD-10-CM

## 2014-11-27 DIAGNOSIS — M5489 Other dorsalgia: Secondary | ICD-10-CM | POA: Diagnosis not present

## 2014-11-27 DIAGNOSIS — M4316 Spondylolisthesis, lumbar region: Secondary | ICD-10-CM | POA: Diagnosis not present

## 2014-11-27 DIAGNOSIS — R159 Full incontinence of feces: Secondary | ICD-10-CM | POA: Diagnosis not present

## 2014-11-27 DIAGNOSIS — I959 Hypotension, unspecified: Secondary | ICD-10-CM | POA: Diagnosis not present

## 2014-11-27 LAB — URINALYSIS, ROUTINE W REFLEX MICROSCOPIC
Bilirubin Urine: NEGATIVE
GLUCOSE, UA: NEGATIVE mg/dL
KETONES UR: NEGATIVE mg/dL
Leukocytes, UA: NEGATIVE
NITRITE: NEGATIVE
PH: 5 (ref 5.0–8.0)
Protein, ur: NEGATIVE mg/dL
SPECIFIC GRAVITY, URINE: 1.009 (ref 1.005–1.030)
Urobilinogen, UA: 0.2 mg/dL (ref 0.0–1.0)

## 2014-11-27 LAB — SEDIMENTATION RATE: Sed Rate: 24 mm/hr — ABNORMAL HIGH (ref 0–22)

## 2014-11-27 LAB — CBC WITH DIFFERENTIAL/PLATELET
BASOS ABS: 0 10*3/uL (ref 0.0–0.1)
BASOS PCT: 0 % (ref 0–1)
EOS ABS: 0.1 10*3/uL (ref 0.0–0.7)
Eosinophils Relative: 2 % (ref 0–5)
HCT: 42.5 % (ref 36.0–46.0)
Hemoglobin: 13.8 g/dL (ref 12.0–15.0)
LYMPHS ABS: 2.5 10*3/uL (ref 0.7–4.0)
LYMPHS PCT: 27 % (ref 12–46)
MCH: 27.7 pg (ref 26.0–34.0)
MCHC: 32.5 g/dL (ref 30.0–36.0)
MCV: 85.3 fL (ref 78.0–100.0)
Monocytes Absolute: 0.5 10*3/uL (ref 0.1–1.0)
Monocytes Relative: 5 % (ref 3–12)
NEUTROS ABS: 6.3 10*3/uL (ref 1.7–7.7)
Neutrophils Relative %: 66 % (ref 43–77)
Platelets: 291 10*3/uL (ref 150–400)
RBC: 4.98 MIL/uL (ref 3.87–5.11)
RDW: 13 % (ref 11.5–15.5)
WBC: 9.4 10*3/uL (ref 4.0–10.5)

## 2014-11-27 LAB — URINE MICROSCOPIC-ADD ON

## 2014-11-27 LAB — BASIC METABOLIC PANEL
Anion gap: 10 (ref 5–15)
BUN: 6 mg/dL (ref 6–20)
CALCIUM: 8.6 mg/dL — AB (ref 8.9–10.3)
CO2: 22 mmol/L (ref 22–32)
CREATININE: 0.64 mg/dL (ref 0.44–1.00)
Chloride: 105 mmol/L (ref 101–111)
GFR calc Af Amer: >60 mL/min (ref >60)
Glucose, Bld: 104 mg/dL — ABNORMAL HIGH (ref 65–99)
Potassium: 3.6 mmol/L (ref 3.5–5.1)
Sodium: 137 mmol/L (ref 135–145)

## 2014-11-27 MED ORDER — FENTANYL CITRATE (PF) 100 MCG/2ML IJ SOLN
50.0000 ug | Freq: Once | INTRAMUSCULAR | Status: AC
Start: 2014-11-27 — End: 2014-11-27
  Administered 2014-11-27: 50 ug via INTRAVENOUS
  Filled 2014-11-27: qty 2

## 2014-11-27 MED ORDER — HYDROMORPHONE HCL 1 MG/ML IJ SOLN
1.0000 mg | Freq: Once | INTRAMUSCULAR | Status: AC
  Administered 2014-11-27: 1 mg via INTRAVENOUS
  Filled 2014-11-27: qty 1

## 2014-11-27 MED ORDER — KETOROLAC TROMETHAMINE 30 MG/ML IJ SOLN
30.0000 mg | Freq: Once | INTRAMUSCULAR | Status: AC
  Administered 2014-11-27: 30 mg via INTRAVENOUS
  Filled 2014-11-27: qty 1

## 2014-11-27 MED ORDER — SODIUM CHLORIDE 0.9 % IV BOLUS (SEPSIS)
1000.0000 mL | Freq: Once | INTRAVENOUS | Status: AC
  Administered 2014-11-27: 1000 mL via INTRAVENOUS

## 2014-11-27 MED ORDER — LORAZEPAM 2 MG/ML IJ SOLN
1.0000 mg | Freq: Once | INTRAMUSCULAR | Status: AC
  Administered 2014-11-27: 1 mg via INTRAVENOUS
  Filled 2014-11-27: qty 1

## 2014-11-27 MED ORDER — SODIUM CHLORIDE 0.9 % IV SOLN
INTRAVENOUS | Status: DC
Start: 2014-11-27 — End: 2014-11-28
  Administered 2014-11-27: 17:00:00 via INTRAVENOUS

## 2014-11-27 MED ORDER — ONDANSETRON HCL 4 MG/2ML IJ SOLN
4.0000 mg | Freq: Once | INTRAMUSCULAR | Status: AC
  Administered 2014-11-27: 4 mg via INTRAVENOUS
  Filled 2014-11-27: qty 2

## 2014-11-27 MED ORDER — MORPHINE SULFATE 4 MG/ML IJ SOLN
8.0000 mg | Freq: Once | INTRAMUSCULAR | Status: AC
  Administered 2014-11-27: 8 mg via INTRAVENOUS
  Filled 2014-11-27: qty 2

## 2014-11-27 MED ORDER — GADOBENATE DIMEGLUMINE 529 MG/ML IV SOLN
20.0000 mL | Freq: Once | INTRAVENOUS | Status: AC | PRN
  Administered 2014-11-27: 20 mL via INTRAVENOUS

## 2014-11-27 NOTE — ED Notes (Signed)
Pt states she injured her back on 5/8 while lifting a client.  Since 5/19 she's been incontinent of urine and stool.  She came in b/c the pain in the center of her spine is increasing and she is worried about the incontinence.

## 2014-11-27 NOTE — ED Notes (Signed)
Pt is ready for MRI, MRI notified.

## 2014-11-27 NOTE — ED Provider Notes (Signed)
CSN: [id number]     Arrival date & time 11/27/14  1641 History   First MD Initiated Contact with Patient 11/27/14 1657     Chief Complaint  Patient presents with  . Back Pain     (Consider location/radiation/quality/duration/timing/severity/associated sxs/prior Treatment) HPI Comments: Patient here complaining of worsening thoracic and lumbar mid spinal back pain 2 weeks. 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 Seen here for similar symptoms as well as flank pain had a CT of her abdomen and pelvis which did not show any acute findings. Patient now notes double controlling her bowel and bladder function. Specifically she states that she feels that she can't get to the toilet in time. Denies any saddle anesthesias. Pain characterized as sharp and does radiate to her legs. Denies any foot drop when walking. No dysuria or hematuria. Symptoms persistent and worsening ambulation. She is currently taking pain medication without relief.  Patient is a 59 y.o. female presenting with back pain. The history is provided by the patient.  Back Pain   Past Medical History  Diagnosis Date  . Adrenal hyperplasia    Past Surgical History  Procedure Laterality Date  . Cesarean section     No family history on file. History  Substance Use Topics  . Smoking status: Never Smoker   . Smokeless tobacco: Not on file  . Alcohol Use: No   OB History    No data available     Review of Systems  Musculoskeletal: Positive for back pain.  All other systems reviewed and are negative.     Allergies  Review of patient's allergies indicates no known allergies.  Home Medications   Prior to Admission medications   Medication Sig Start Date End Date Taking? Authorizing Provider  cyclobenzaprine (FLEXERIL) 10 MG tablet Take 1 tablet (10 mg total) by mouth 3 (three) times daily as needed for muscle spasms. 11/17/14   Thao P Le, DO  diazepam (VALIUM) 5 MG tablet Take 1 tablet (5 mg total) by mouth  every 12 (twelve) hours as needed for muscle spasms. 11/17/14   Thao P Le, DO  Ginsengs-Royal Jelly (GINSENG COMPLEX/ROYAL JELLY PO) Take 800 mg by mouth 2 (two) times daily.    Historical Provider, MD  hydrocortisone (CORTEF) 10 MG tablet Take 10 mg by mouth daily.    Historical Provider, MD  Oxycodone HCl 10 MG TABS Take 1 tablet (10 mg total) by mouth 3 (three) times daily as needed. Take with stool softener, do not double up medicine, stop taking Percocet and  Oxy IR 11/17/14   Thao P Le, DO  vitamin B-12 (CYANOCOBALAMIN) 500 MCG tablet Take 500 mcg by mouth daily.    Historical Provider, MD   BP 158/78 mmHg  Pulse 108  Temp(Src) 98.5 F (36.9 C) (Oral)  Resp 18  Ht  (1.575 m)  Wt 247 lb (112.038 kg)  BMI 45.17 kg/m2  SpO2 98% Physical Exam  Constitutional: She is oriented to person, place, and time. She appears well-developed and well-nourished.  Non-toxic appearance. No distress.  HENT:  Head: Normocephalic and atraumatic.  Eyes: Conjunctivae, EOM and lids are normal. Pupils are equal, round, and reactive to light.  Neck: Normal range of motion. Neck supple. No tracheal deviation present. No thyroid mass present.  Cardiovascular: Normal rate, regular rhythm and normal heart sounds.  Exam reveals no gallop.   No murmur heard. Pulmonary/Chest: Effort normal and breath sounds normal. No stridor. No respiratory distress. She has no decreased breath  sounds. She has no wheezes. She has no rhonchi. She has no rales.  Abdominal: Soft. Normal appearance and bowel sounds are normal. She exhibits no distension. There is no tenderness. There is no rebound and no CVA tenderness.  Musculoskeletal: Normal range of motion. She exhibits no edema or tenderness.       Back:  Neurological: She is alert and oriented to person, place, and time. She has normal strength. No cranial nerve deficit or sensory deficit. Coordination and gait normal. GCS eye subscore is 4. GCS verbal subscore is 5. GCS motor  subscore is 6.  Patient ambulate in my presence without foot drop  Skin: Skin is warm and dry. No abrasion and no rash noted.  Psychiatric: Her speech is normal and behavior is normal. Her mood appears anxious.  Nursing note and vitals reviewed.   ED Course  Procedures (including critical care time) Labs Review Labs Reviewed  CBC WITH DIFFERENTIAL/PLATELET  BASIC METABOLIC PANEL  SEDIMENTATION RATE  URINALYSIS, ROUTINE W REFLEX MICROSCOPIC (NOT AT [facility])    Imaging Review No results found.   EKG Interpretation None      MDM   Final diagnoses:  Back pain  Back pain   patient given multiple rounds of pain medication here. Also given medications for muscle relaxation. Continues to endorse severe pain. MRI of patient's thoracic and lumbar spine without signs of cord compression, abscess, hematoma. Extensive degenerative disc disease noted. Patient's blood pressure did decrease due to medication effects but has improved. Because the patient's severe pain as well as her reported history of incontinence of urine and bowels will consult hospice for admission    Lorre NickAnthony Emnet Monk, MD 11/27/14 2325

## 2014-11-27 NOTE — Progress Notes (Signed)
Subjective:  This chart was scribed for Meredith StaggersJeffrey Margretta Zamorano, MD by [facility] Hashem, medical scribe at [facility].The patient was seen in exam room 02 and the patient's care was started at 3:55 PM.   Patient ID: Tracy Larson, female    DOB: [birth year], 59 y.o.   MRN: [id number] Chief Complaint  Patient presents with  . Follow-up    for back pain  . Medication Refill    need diazepam, valium and flexeril   HPI HPI Comments: Tracy Larson is a 59 y.o. female who presents to [facility] for a follow up for a back pain and medication refill. Initial injury was on 11/07/2014.  She was initially seen 11/08/14 by Dr. Dareen PianoAnderson after an injury at work, she moved a patient in bed and injured her back in the process with pain in her shoulder blades down to her lower back. Treated with flexeril and anaprox. Told to ice then heat and light duty for a week. Seen two days later in the ED for dyspnea and back pain. Noted to have hematuria, she had a CT of her abdomen to rule out a kidney stones. CT showed degenerative changes in lower lumber spine and hips, as well as a small periumbilical hernia but no acute processes.   Seen for a follow up with Dr. Conley RollsLe on May 12 th. 10/10 pain at that time she was taking valium and roxicet. Per that note she has a history of CAH and has taken hydrocortisone. She stopped her roxciet and valium due to sedation instead prescribed oxycodone IR. Due to elevated LFTs on CMP, trying to avoid tylenol and planned follow up in one week  Seen on May 18 th still 10/10 pain with reported occasional urine incontinence with slight dripping but no bowel incontinence and saddle anesthesia . At that time she had so much pain she changed zanaflex back to flexeril and restarted the valium, she had also been doubling up on the oxycodone and percocet which eased some of her pain allowing her to walk. Of note her L-spine X-ray and T- spine X-ray from May 12 th  showed multiple lumbar disc degenerative changes greatest L4-L5 with grade 1 anterolisthesis. She declined a digital rectal exam. Dr. Conley RollsLe increased her Oxycodone to 10 mg three times a day and refilled flexeril, also refilled valium for use twice a day only if the flexeril and oxycodone did not help with pain relief. Dr. Conley RollsLe planned on stat MRI that day which the patient declined. Planned on follow up in three days per visit summary. She has not been seen since May 18 th. She has not had the MRI yet.  Given 20 valium, Flexeril she was given 30, Oxycodone was given 30.  Patient states she was unable to make it here for follow up visit because she was in too much pain. Taking oxycodone and flexeril three times a day and Valium every twelve hours. Her urine incontinence is still the same but now has bowel incontinence which began on May 19 th, last episode was this morning. No saddle anesthesia. Her pain today is 7/10, she is able to walk but is short of breath. She describes the pain as stabbing at the top center of her back and radiates down to the middle of her back. No weakness or pain running down the legs.  Patient Active Problem List   Diagnosis Date Noted  . Morbid obesity 11/08/2014  . Arthritis 06/04/2014  .  Congenital adrenal hyperplasia 02/21/2014   Past Medical History  Diagnosis Date  . Adrenal hyperplasia    No past surgical history on file. No Known Allergies Prior to Admission medications   Medication Sig Start Date End Date Taking? Authorizing Provider  cyclobenzaprine (FLEXERIL) 10 MG tablet Take 1 tablet (10 mg total) by mouth 3 (three) times daily as needed for muscle spasms. 11/17/14  Yes Thao P Le, DO  diazepam (VALIUM) 5 MG tablet Take 1 tablet (5 mg total) by mouth every 12 (twelve) hours as needed for muscle spasms. 11/17/14  Yes Thao P Le, DO  Ginsengs-Royal Jelly (GINSENG COMPLEX/ROYAL JELLY PO) Take 800 mg by mouth 2 (two) times daily.   Yes Historical Provider, MD    hydrocortisone (CORTEF) 10 MG tablet Take 10 mg by mouth daily.   Yes Historical Provider, MD  Oxycodone HCl 10 MG TABS Take 1 tablet (10 mg total) by mouth 3 (three) times daily as needed. Take with stool softener, do not double up medicine, stop taking Percocet and  Oxy IR 11/17/14  Yes Thao P Le, DO  vitamin B-12 (CYANOCOBALAMIN) 500 MCG tablet Take 500 mcg by mouth daily.   Yes Historical Provider, MD   History   Social History  . Marital Status: Married    Spouse Name: [name]  . Number of Children: [name]  . Years of Education: [name]   Occupational History  . Not on file.   Social History Main Topics  . Smoking status: Never Smoker   . Smokeless tobacco: Not on file  . Alcohol Use: No  . Drug Use: No  . Sexual Activity: Not on file   Other Topics Concern  . Not on file   Social History Narrative   Review of Systems  Musculoskeletal: Positive for back pain, arthralgias and gait problem.  Neurological: Negative for numbness.      Objective:  BP 104/76 mmHg  Pulse 60  Temp(Src) 98.3 F (36.8 C) (Oral)  Resp 18  Ht 5' (1.524 m)  Wt 246 lb 9.6 oz (111.857 kg)  BMI 48.16 kg/m2  SpO2 98% Physical Exam  Constitutional: She is oriented to person, place, and time. She appears well-developed and well-nourished. No distress.  Overweight/obese.   HENT:  Head: Normocephalic and atraumatic.  Eyes: Pupils are equal, round, and reactive to light.  Neck: Normal range of motion.  Cardiovascular: Normal rate and regular rhythm.   Pulmonary/Chest: Effort normal and breath sounds normal. No respiratory distress (breath sounds distant but appear to be equal and normal. ).  Musculoskeletal: Normal range of motion.  With initial light touch to left upper back with stethoscope caused acute pain with patient jumping, but repeat exam to same area did not cause pain. However with any light touch on left or right mid to low back - pt withdraws due to pain. Difficult/guarded exam.  Able to  ambulate, but slow and guarded.   Neurological: She is alert and oriented to person, place, and time.  Skin: Skin is warm and dry. No rash noted.  Psychiatric:  interacts appropriately with questioning. Tearful at times during exam.   Nursing note and vitals reviewed.     Assessment & Plan:  Jamela Cumbo is a 59 y.o. female Stool incontinence  Urinary incontinence, unspecified incontinence type  Midline back pain, unspecified location  Low back pain without sciatica, unspecified back pain laterality  Injury at work 11/07/14 with upper and lower back pain. See prior notes and eval.  Hx of DJD  of lumbar spine.  Urinary incontinence prior, but declined MRI last visit. Now with new history of stool incontinence over past 9 days, but still denies leg radiation of pain or weakness or saddle anesthesia. Pain now 7/10 with oxycodone, flexeril and valium, but with guarded exam and both urinary and stool incontinence - will have evaluated tonight in ER. dtr here to drive her and advised charge nurse at [facility]. Note for work given.   No orders of the defined types were placed in this encounter.   Patient Instructions  Go straight to [facility] ER for evaluation of your back pain, as I am concerned about your urinary and stool incontinence.  Depending on evaluation and treatment there, follow up with Dr. Conley Rolls or myself in next few days.      I personally performed the services described in this documentation, which was scribed in my presence. The recorded information has been reviewed and considered, and addended by me as needed.

## 2014-11-27 NOTE — Patient Instructions (Signed)
[facility] your back pain, as I am concerned about your urinary and stool incontinence.  Depending on evaluation and treatment there, follow up with Dr. Conley RollsLe or myself in next few days.

## 2014-11-27 NOTE — ED Notes (Signed)
Patient transported to MRI 

## 2014-11-28 DIAGNOSIS — M549 Dorsalgia, unspecified: Secondary | ICD-10-CM

## 2014-11-28 DIAGNOSIS — E259 Adrenogenital disorder, unspecified: Secondary | ICD-10-CM

## 2014-11-28 DIAGNOSIS — R32 Unspecified urinary incontinence: Secondary | ICD-10-CM | POA: Diagnosis present

## 2014-11-28 DIAGNOSIS — E250 Congenital adrenogenital disorders associated with enzyme deficiency: Secondary | ICD-10-CM | POA: Diagnosis present

## 2014-11-28 DIAGNOSIS — R159 Full incontinence of feces: Secondary | ICD-10-CM | POA: Diagnosis present

## 2014-11-28 LAB — RAPID URINE DRUG SCREEN, HOSP PERFORMED
Amphetamines: NOT DETECTED
BARBITURATES: NOT DETECTED
Benzodiazepines: NOT DETECTED
Cocaine: NOT DETECTED
Opiates: POSITIVE — AB
Tetrahydrocannabinol: NOT DETECTED

## 2014-11-28 LAB — CORTISOL-AM, BLOOD: Cortisol - AM: 11.9 ug/dL (ref 6.7–22.6)

## 2014-11-28 MED ORDER — TIZANIDINE HCL 4 MG PO TABS
2.0000 mg | ORAL_TABLET | Freq: Three times a day (TID) | ORAL | Status: DC | PRN
  Administered 2014-11-28 – 2014-11-29 (×3): 2 mg via ORAL
  Filled 2014-11-28 (×3): qty 1

## 2014-11-28 MED ORDER — TRAMADOL HCL 50 MG PO TABS
50.0000 mg | ORAL_TABLET | Freq: Four times a day (QID) | ORAL | Status: DC | PRN
  Administered 2014-11-29 – 2014-11-30 (×2): 50 mg via ORAL
  Filled 2014-11-28 (×2): qty 1

## 2014-11-28 MED ORDER — ONDANSETRON HCL 4 MG/2ML IJ SOLN: 4.0000 mg | Freq: Four times a day (QID) | INTRAMUSCULAR | Status: DC | PRN

## 2014-11-28 MED ORDER — HYDROCORTISONE 5 MG PO TABS
15.0000 mg | ORAL_TABLET | Freq: Two times a day (BID) | ORAL | Status: DC
  Filled 2014-11-28: qty 1

## 2014-11-28 MED ORDER — FENTANYL CITRATE (PF) 100 MCG/2ML IJ SOLN
50.0000 ug | Freq: Once | INTRAMUSCULAR | Status: AC
  Administered 2014-11-28: 50 ug via INTRAVENOUS
  Filled 2014-11-28: qty 2

## 2014-11-28 MED ORDER — HEPARIN SODIUM (PORCINE) 5000 UNIT/ML IJ SOLN: 5000.0000 [IU] | Freq: Three times a day (TID) | INTRAMUSCULAR | Status: DC

## 2014-11-28 MED ORDER — HYDROCORTISONE 5 MG PO TABS
15.0000 mg | ORAL_TABLET | Freq: Two times a day (BID) | ORAL | Status: DC
  Administered 2014-11-28 – 2014-11-30 (×6): 15 mg via ORAL
  Filled 2014-11-28 (×8): qty 1

## 2014-11-28 MED ORDER — SODIUM CHLORIDE 0.9 % IV SOLN
INTRAVENOUS | Status: DC
  Administered 2014-11-28: 06:00:00 via INTRAVENOUS

## 2014-11-28 MED ORDER — OXYCODONE HCL 5 MG PO TABS
5.0000 mg | ORAL_TABLET | ORAL | Status: DC | PRN
  Administered 2014-11-28 – 2014-11-29 (×3): 5 mg via ORAL
  Filled 2014-11-28 (×3): qty 1

## 2014-11-28 MED ORDER — SODIUM CHLORIDE 0.9 % IV BOLUS (SEPSIS)
500.0000 mL | Freq: Once | INTRAVENOUS | Status: AC
  Administered 2014-11-28: 500 mL via INTRAVENOUS

## 2014-11-28 NOTE — H&P (Signed)
[facility] Admission History and Physical Service Pager: [phone]  Patient name: Tracy Larson Medical record number: [id number] Date of birth: 12-Dec-1955 Age: 59 y.o. Gender: female  Primary Care Provider: No PCP Per Patient Consultants: None Code Status: Full  Chief Complaint: Back pain, urinary incontinence, fecal incontinence   Assessment and Plan: Tracy Larson is a 59 y.o. female presenting with fecal and urinary incontinence in the setting of 3 weeks of back pain. PMH is significant for congenital adrenal hyperplasia  Back pain / urinary incontinence / fecal incontinence. Concern for cord compression given constellation of symptoms, however MRI showed degenerative changes throughout T spine and L spine with no acute impingement or cord compression. In addition, patient had no acute focal neurological findings on exam. Back pain likely related to degenerative changes in addition to musculoskeletal from recent injury. Unclear etiology for urinary incontinence, but does not seem to be neurogenic as patient has sensation of fullness and urgency preceding urination - more consistent with urge incontinence. UA unremarkable. Also unclear etiology for fecal incontinence, however given good rectal tone less likely to be neurogenic and more consistent with overflow in the setting of recent diarrhea. Of note the patient required large amounts of sedating medications in the ED to control pain including 49m of dilaudid, 837mof morphine, 10023mof fentanyl, and 3mg16m ativan.  - Admit to telemetry under attending Dr WaldMingo Amberramadol prn pain - Consider neurology or neurosurgery consult in the morning  - Consider bladder scan and starting oxybutynin if urinary incontinence returns - Consider obtaining c diff if diarrhea returns.   Hypotension. Patient noted to be hypotensive to 80-90s/50-60s in the ED with mild tachycardia (low 100s). Most likely a component of  dehydration, though large amounts of sedating medications as listed above also likely playing a role. No other signs of adrenal crisis (Electrolytes and glucose normal, no fever, no abdominal pain.) S/p 1L bolus in ED  - Monitor on telemetry  - NS '@125cc' /hr for 12 hrs - Will give additional 500c bolus.   Congenital adrenal hyperplasia. On lifelong corticosteroid therapy.  - Continue home hydrocortisone 15mg28m - Stress dose if shows signs of adrenal crisis.   FEN/GI: Ice chips until less lethagic, then ADAT, NS '@125cc' /hr Prophylaxis: subQ heparin  Disposition: Admitted pending above management.   History of Present Illness: Tracy Larson 59 y.60 female presenting with fecal and urinary incontinence in the setting of 3 weeks of back pain. History is provided by the patient, though somewhat limited due to the patient's lethargy secondary to sedating medications received in the ED.  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 States that she immediately noticed pain in her upper back that felt like "pins and needles." This pain progressed to including a "sharp" pain in the center of her back that radiate to her lower back. Patient reports going to urgent care last week. States that they "ran some tests" and gave her pills for her pain. Patient cannot remember the names of the pills, but states that they did not help her pain.  Eleven days ago, the patient reports first noticing urinary incontinence. Reports that she has the urge to urinate, however is unable to make it to the restroom on time. Reports that she has been unable to control her urination since then.  Patient also reports fecal incontinence for the past 10 days. Reports that it  started as "constipation" but then had watery stools. Reports that she has had two episodes where she was unable to control her bowels and had large watery stools. Has diarrhea if she eats  anything.  Endorses some arm weakness. No leg weakness. No saddle anesthesia. One episode of emesis 1 week ago. No abdominal pain. No dysuria or hematuria. No hematochezia or melena. No chest pain. Has some shortness of breath with the pain.   Review Of Systems: Per HPI, otherwise 12 point review of systems was performed and was unremarkable.  Patient Active Problem List   Diagnosis Date Noted  . Back pain 11/27/2014  . Morbid obesity 11/08/2014  . Arthritis 06/04/2014  . Congenital adrenal hyperplasia 02/21/2014   Past Medical History: Past Medical History  Diagnosis Date  . Adrenal hyperplasia    Past Surgical History: Past Surgical History  Procedure Laterality Date  . Cesarean section     Social History: History  Substance Use Topics  . Smoking status: Never Smoker   . Smokeless tobacco: Not on file  . Alcohol Use: No   Please also refer to relevant sections of EMR.  Family History: Noncontributory.  Allergies and Medications: No Known Allergies No current facility-administered medications on file prior to encounter.   Current Outpatient Prescriptions on File Prior to Encounter  Medication Sig Dispense Refill  . cyclobenzaprine (FLEXERIL) 10 MG tablet Take 1 tablet (10 mg total) by mouth 3 (three) times daily as needed for muscle spasms. 30 tablet 0  . diazepam (VALIUM) 5 MG tablet Take 1 tablet (5 mg total) by mouth every 12 (twelve) hours as needed for muscle spasms. 20 tablet 0  . Ginsengs-Royal Jelly (GINSENG COMPLEX/ROYAL JELLY PO) Take 800 mg by mouth 2 (two) times daily.    . hydrocortisone (CORTEF) 10 MG tablet Take 10 mg by mouth daily.    . Oxycodone HCl 10 MG TABS Take 1 tablet (10 mg total) by mouth 3 (three) times daily as needed. Take with stool softener, do not double up medicine, stop taking Percocet and  Oxy IR (Patient taking differently: Take 10 mg by mouth 3 (three) times daily as needed (pain). Take with stool softener, do not double up medicine,  stop taking Percocet and  Oxy IR) 30 tablet 0  . vitamin B-12 (CYANOCOBALAMIN) 500 MCG tablet Take 500 mcg by mouth daily.      Objective: BP 95/62 mmHg  Pulse 104  Temp(Src) 97.9 F (36.6 C) (Axillary)  Resp 18  Ht '5\' 2"'  (1.575 m)  Wt 247 lb (112.038 kg)  BMI 45.17 kg/m2  SpO2 99% Exam: General: 59 year old obese female lying in hospital bed, eyes closed, surgical mask on  Eyes: PERRL, EOMI ENTM: Dry appearing mucus membranes, several dental carries noted Neck: Supple, FROM Cardiovascular: Distant heart sounds, RRR, no murmurs appreciated Respiratory: NWOB, CTAB with some upper airway sounds transmitted Abdomen: Obese, +BS, S, NT, ND Ext: No cyanosis or edema Skin: No rashes or areas of skin breakdown BACK: Tender to palpation along spinous processes from C spine to L spine. Exquisitely tender to palpation along paraspinous areas of lumbar spine (patient jumped from bed with light touch to these areas). No step offs or deformities noted.  Neuro: Lethargic, though responds to questions and commands. CN2-12 grossly intact. LUE with 5/5 strength, RUE with 4+/5 strength. LE with 5/5 strength bilaterally on plantar flexion. Could not assess leg strength secondary to patient intolerance of exam. Patellar reflexes 2+ bilaterally. Sensation to gross touch intact  throughout. Mildly decreased rectal tone, though able to bear down. No feces noted at anus or in rectal vault. No saddle anesthesia.  Psych: Affect blunted, normal thought content.   Labs and Imaging: CBC BMET   Recent Labs Lab 11/27/14 1732  WBC 9.4  HGB 13.8  HCT 42.5  PLT 291    Recent Labs Lab 11/27/14 1732  NA 137  K 3.6  CL 105  CO2 22  BUN 6  CREATININE 0.64  GLUCOSE 104*  CALCIUM 8.6*     Urinalysis    Component Value Date/Time   COLORURINE YELLOW 11/27/2014 2253   APPEARANCEUR CLEAR 11/27/2014 2253   LABSPEC 1.009 11/27/2014 2253   PHURINE 5.0 11/27/2014 2253   GLUCOSEU NEGATIVE 11/27/2014 2253    HGBUR TRACE* 11/27/2014 2253   BILIRUBINUR NEGATIVE 11/27/2014 2253   [place] 11/27/2014 2253   PROTEINUR NEGATIVE 11/27/2014 2253   UROBILINOGEN 0.2 11/27/2014 2253   NITRITE NEGATIVE 11/27/2014 2253   LEUKOCYTESUR NEGATIVE 11/27/2014 2253   ESR 24  Mr Thoracic Spine W Wo Contrast  11/27/2014   CLINICAL DATA:  Back injury Nov 07, 2014 while lifting a client, incontinence beginning Nov 18, 2014, worsening pain. History of adrenal hyperplasia, arthritis and obesity.  EXAM: MRI THORACIC AND LUMBAR SPINE WITHOUT AND WITH CONTRAST  TECHNIQUE: Multiplanar and multiecho pulse sequences of the thoracic and lumbar spine were obtained without and with intravenous contrast.  CONTRAST:  66m MULTIHANCE GADOBENATE DIMEGLUMINE 529 MG/ML IV SOLN  COMPARISON:  CT abdomen and pelvis Nov 10, 2014  FINDINGS: MR THORACIC SPINE FINDINGS  Larger body habitus results in overall decreased signal to noise ratio. Thoracic vertebral bodies and posterior elements are intact and aligned with maintenance of thoracic kyphosis. Mild T10-11 disc height loss, with slight decreased T2 signal within the mid to lower thoracic disc consistent with mild desiccation. Very mild chronic discogenic endplate changes TV3-7through all T10-11 without STIR signal abnormality to suggest acute osseous process. No abnormal osseous nor intradiscal enhancement. Multiple perineural cysts, largest 6 mm RIGHT C8 perineural cyst.  Thoracic spinal cord appears normal morphology and signal characteristics the level of conus medullaris which terminates at L1. No abnormal cord, leptomeningeal or extra-axial enhancement. Partially imaged RIGHT is severe periscapular muscle atrophy.  Very small broad-based disc bulges T7-8, T8-9, T10-11. Mild lower thoracic facet arthropathy. No canal stenosis or neural foraminal narrowing at any level.  MR LUMBAR SPINE FINDINGS  Lumbar vertebral bodies intact. Grade 1 L4-5 anterolisthesis (4 mm) without spondylolysis.  Mild L2-3 through L4-5, moderate L5-S1 disc height loss, mildly decreased T2 signal within the lower lumbar disc consistent mild desiccation. Mild chronic discogenic endplate changes LT0-6through L5-S1 without STIR signal abnormality to suggest acute osseous process. No abnormal osseous or intradiscal enhancement.  Faint bright STIR signal and enhancement within the L2-3 interspinous space may reflect ligamentous injury. Conus medullaris terminates at L1 and appears normal in morphology and signal characteristics. Tubular bright T1 signal along the filum terminale measuring up to 3-4 mm, associated with chemical shift artifact consistent with fibro lipomatosis changes. No cord tethering. Included prevertebral soft tissues unremarkable. Moderate symmetric paraspinal muscle atrophy.  Level by level evaluation:  L1-2:  No disc bulge, canal stenosis or neural foraminal narrowing.  L2-3: Small broad-based disc bulge asymmetric to the RIGHT. Enhancing annular fissure. Mild facet arthropathy and ligamentum flavum redundancy without canal stenosis. Mild to moderate bilateral neural foraminal narrowing.  L3-4: Small broad-based disc bulge. Moderate facet arthropathy and ligamentum flavum redundancy. Mild  canal stenosis. Mild to moderate RIGHT, mild LEFT neural foraminal narrowing.  L4-5: Anterolisthesis. Small broad-based disc bulge. Severe facet arthropathy and ligamentum flavum redundancy with trace LEFT facet effusion which is likely reactive. Moderate canal stenosis and trefoil configuration with RIGHT greater LEFT lateral recess effacement which likely affects the traversing L5 nerves. In addition, there is encroachment upon the exited RIGHT L4 nerve. Moderate to severe RIGHT greater than LEFT neural foraminal narrowing.  L5-S1: Transitional anatomy, partially sacralized L5 vertebral body. Small broad-based disc bulge. Enhancing annular fissure. Moderate facet arthropathy. Mild canal stenosis, encroachment upon the  traversing S1 nerves. Mild RIGHT, moderate LEFT neural foraminal narrowing.  IMPRESSION: MRI THORACIC SPINE: Early degenerative change of the thoracic spine without acute fracture, malalignment or neurocompressive changes. No suspicious enhancement.  Asymmetric RIGHT periscapular severe muscle atrophy (possibly infra spinatus versus latissimus dorsi).  MRI LUMBAR SPINE: Grade 1 L4-5 anterolisthesis on degenerative basis without spondylolysis. No acute fracture. Probable L2-3 interspinous ligament strain.  Fibro lipomatosis changes of the filum terminale without cord tethering.  Moderate canal stenosis at L4-5, mild at L3-4 and L5-S1.  Neural foraminal narrowing L2-3 through L5-S1: Moderate to severe at L4-5.  L2-3 and L5-S1 annular fissures.  No suspicious enhancement.   Electronically Signed   By: Elon Alas M.D.   On: 11/27/2014 22:18   Mr Lumbar Spine W Wo Contrast  11/27/2014   CLINICAL DATA:  Back injury Nov 07, 2014 while lifting a client, incontinence beginning Nov 18, 2014, worsening pain. History of adrenal hyperplasia, arthritis and obesity.  EXAM: MRI THORACIC AND LUMBAR SPINE WITHOUT AND WITH CONTRAST  TECHNIQUE: Multiplanar and multiecho pulse sequences of the thoracic and lumbar spine were obtained without and with intravenous contrast.  CONTRAST:  39m MULTIHANCE GADOBENATE DIMEGLUMINE 529 MG/ML IV SOLN  COMPARISON:  CT abdomen and pelvis Nov 10, 2014  FINDINGS: MR THORACIC SPINE FINDINGS  Larger body habitus results in overall decreased signal to noise ratio. Thoracic vertebral bodies and posterior elements are intact and aligned with maintenance of thoracic kyphosis. Mild T10-11 disc height loss, with slight decreased T2 signal within the mid to lower thoracic disc consistent with mild desiccation. Very mild chronic discogenic endplate changes TD6-6through all T10-11 without STIR signal abnormality to suggest acute osseous process. No abnormal osseous nor intradiscal enhancement. Multiple  perineural cysts, largest 6 mm RIGHT C8 perineural cyst.  Thoracic spinal cord appears normal morphology and signal characteristics the level of conus medullaris which terminates at L1. No abnormal cord, leptomeningeal or extra-axial enhancement. Partially imaged RIGHT is severe periscapular muscle atrophy.  Very small broad-based disc bulges T7-8, T8-9, T10-11. Mild lower thoracic facet arthropathy. No canal stenosis or neural foraminal narrowing at any level.  MR LUMBAR SPINE FINDINGS  Lumbar vertebral bodies intact. Grade 1 L4-5 anterolisthesis (4 mm) without spondylolysis. Mild L2-3 through L4-5, moderate L5-S1 disc height loss, mildly decreased T2 signal within the lower lumbar disc consistent mild desiccation. Mild chronic discogenic endplate changes LY4-0through L5-S1 without STIR signal abnormality to suggest acute osseous process. No abnormal osseous or intradiscal enhancement.  Faint bright STIR signal and enhancement within the L2-3 interspinous space may reflect ligamentous injury. Conus medullaris terminates at L1 and appears normal in morphology and signal characteristics. Tubular bright T1 signal along the filum terminale measuring up to 3-4 mm, associated with chemical shift artifact consistent with fibro lipomatosis changes. No cord tethering. Included prevertebral soft tissues unremarkable. Moderate symmetric paraspinal muscle atrophy.  Level by level evaluation:  L1-2:  No disc bulge, canal stenosis or neural foraminal narrowing.  L2-3: Small broad-based disc bulge asymmetric to the RIGHT. Enhancing annular fissure. Mild facet arthropathy and ligamentum flavum redundancy without canal stenosis. Mild to moderate bilateral neural foraminal narrowing.  L3-4: Small broad-based disc bulge. Moderate facet arthropathy and ligamentum flavum redundancy. Mild canal stenosis. Mild to moderate RIGHT, mild LEFT neural foraminal narrowing.  L4-5: Anterolisthesis. Small broad-based disc bulge. Severe facet  arthropathy and ligamentum flavum redundancy with trace LEFT facet effusion which is likely reactive. Moderate canal stenosis and trefoil configuration with RIGHT greater LEFT lateral recess effacement which likely affects the traversing L5 nerves. In addition, there is encroachment upon the exited RIGHT L4 nerve. Moderate to severe RIGHT greater than LEFT neural foraminal narrowing.  L5-S1: Transitional anatomy, partially sacralized L5 vertebral body. Small broad-based disc bulge. Enhancing annular fissure. Moderate facet arthropathy. Mild canal stenosis, encroachment upon the traversing S1 nerves. Mild RIGHT, moderate LEFT neural foraminal narrowing.  IMPRESSION: MRI THORACIC SPINE: Early degenerative change of the thoracic spine without acute fracture, malalignment or neurocompressive changes. No suspicious enhancement.  Asymmetric RIGHT periscapular severe muscle atrophy (possibly infra spinatus versus latissimus dorsi).  MRI LUMBAR SPINE: Grade 1 L4-5 anterolisthesis on degenerative basis without spondylolysis. No acute fracture. Probable L2-3 interspinous ligament strain.  Fibro lipomatosis changes of the filum terminale without cord tethering.  Moderate canal stenosis at L4-5, mild at L3-4 and L5-S1.  Neural foraminal narrowing L2-3 through L5-S1: Moderate to severe at L4-5.  L2-3 and L5-S1 annular fissures.  No suspicious enhancement.   Electronically Signed   By: Elon Alas M.D.   On: 11/27/2014 22:18    Vivi Barrack, MD 11/28/2014, 1:19 AM PGY-1, [facility] Intern pager: [phone], text pages welcome

## 2014-11-28 NOTE — ED Notes (Signed)
Admitting doctors at bedside.

## 2014-11-28 NOTE — Progress Notes (Signed)
Family Medicine Teaching Service Daily Progress Note Intern Pager: [phone]  Patient name: Tracy Larson Medical record number: [id number] Date of birth: 12/26/1955 Age: 59 y.o. Gender: female  Primary Care Provider: No PCP Per Patient Consultants: None Code Status: Full  Pt Overview and Major Events to Date:  5/28 Admitted for severe back pain, incontinence worrisome for cauda equina  Assessment and Plan: Tracy Larson is a 59 y.o. 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 History of adrenal hyperplasia, arthritis and obesity.   Back pain: Constellation of symptoms (with urinary and fecal incontinence) worrisome for cauda equina, though no saddle anesthesia, abnormal reflexes, and normal rectal tone. MRI shows only moderate canal stenosis at L4-5, mild at L3-4 and L5-S1.  Neural foraminal narrowing L2-3 through L5-S1: Moderate to severe at L4-5.  - Per discussion with Dr. Franky Macho of neurosurgery who reviewed the case including imaging, pt does not have an operative indication and does not need to follow up for this.  - Treat symptomatically with zanaflex and oxyIR - Physical therapy consulted  Urinary incontinence: More consistent with urge incontinence than overflow. UA unremarkable.  - Check post void I/O cath bladder volume: If low, this is not due to cord compression  Fecal incontinence: With normal rectal tone, this is not attributable to neurogenic etiology.  - Continue to monitor, consider C diff if diarrhea develops  Hypotension: With baseline low pressures in outpatient notes. Mild tachycardia (90-100 bpm) not improved with isotonic IVF's.  No other signs of adrenal crisis (Electrolytes and glucose normal, no fever, no abdominal pain.)  - Continue telemetry  Congenital adrenal hyperplasia:  On lifelong glucocorticoid therapy. Has had 2 adrenal crises (1985, 2005). AM cortisol at goal 11.9  - Continue  current home hydrocortisone  bid (was lowered from  BID at Endocrinology OV 2/4) - No need for stress dose  FEN/GI: Saline lock since tolerating regular diet.  Prophylaxis: subQ heparin  Disposition: Discharge pending pain control given absence of surgical indication.  Subjective:  Ms. Alberico reports continued back pain as well as fecal incontinence and urinary incontinence related to not being able to get to the bathroom on time. She is using a rolling walker due to leg weakness. Ate all of breakfast. Requests muscle relaxer and pain medication. She also reports frequent involuntary twitching of her entire body.   Objective: Temp:  [97.9 F (36.6 C)-98.7 F (37.1 C)] 98.7 F (37.1 C) (05/29 0710) Pulse Rate:  [60-112] 100 (05/29 0710) Resp:  [16-18] 16 (05/29 0710) BP: (78-158)/(34-78) 98/54 mmHg (05/29 0710) SpO2:  [96 %-99 %] 99 % (05/29 0710) Weight:  [246 lb 9.6 oz (111.857 kg)-247 lb (112.038 kg)] 247 lb (112.038 kg) (05/28 1653) Physical Exam: General: Well-appearing 59 yo female laying on left side in bed in no distress.  Cardiovascular: Regular rate, no murmur, no LE edema Respiratory: Nonlabored, clear distant breath sounds.  Abdomen: +BS, soft, NT, ND Back:  Normal skin. Spine with normal alignment and no deformity. No tenderness to vertebral process palpation. Paraspinous muscles are exquisitely tender to the light touch of a stethoscope on throacic back. Spasm difficult to appreciate due to patient writhing.  Neuro: Alert and oriented x4, no ankle clonus, no down-going toe reflex. Sensation intact in bilateral lower extremities.  Laboratory:  Recent Labs Lab 11/27/14 1732  WBC 9.4  HGB 13.8  HCT 42.5  PLT 291    Recent Labs Lab 11/27/14 1732  NA 137  K  3.6  CL 105  CO2 22  BUN 6  CREATININE 0.64  CALCIUM 8.6*  GLUCOSE 104*   Imaging/Diagnostic Tests: MRI THORACIC SPINE: Early degenerative change of the thoracic spine without acute fracture,  malalignment or neurocompressive changes. No suspicious enhancement.  Asymmetric RIGHT periscapular severe muscle atrophy (possibly infra spinatus versus latissimus dorsi).    MRI LUMBAR SPINE: Grade 1 L4-5 anterolisthesis on degenerative basis without spondylolysis. No acute fracture. Probable L2-3 interspinous ligament strain.  Fibro lipomatosis changes of the filum terminale without cord tethering.  Moderate canal stenosis at L4-5, mild at L3-4 and L5-S1.  Neural foraminal narrowing L2-3 through L5-S1: Moderate to severe at L4-5.  L2-3 and L5-S1 annular fissures.  No suspicious enhancement.     Tyrone Nineyan B Jartavious Mckimmy, MD 11/28/2014, 10:06 AM PGY-2, [facility] Intern pager: [phone], text pages welcome

## 2014-11-29 DIAGNOSIS — N3941 Urge incontinence: Secondary | ICD-10-CM

## 2014-11-29 DIAGNOSIS — M549 Dorsalgia, unspecified: Secondary | ICD-10-CM | POA: Diagnosis not present

## 2014-11-29 MED ORDER — OXYCODONE HCL 5 MG PO TABS
5.0000 mg | ORAL_TABLET | ORAL | Status: DC | PRN
  Administered 2014-11-29 – 2014-11-30 (×7): 10 mg via ORAL
  Filled 2014-11-29 (×7): qty 2

## 2014-11-29 MED ORDER — TIZANIDINE HCL 4 MG PO TABS
4.0000 mg | ORAL_TABLET | Freq: Three times a day (TID) | ORAL | Status: DC | PRN
  Administered 2014-11-29 – 2014-11-30 (×4): 4 mg via ORAL
  Filled 2014-11-29 (×4): qty 1

## 2014-11-29 MED ORDER — IBUPROFEN 200 MG PO TABS: 800.0000 mg | ORAL_TABLET | Freq: Three times a day (TID) | ORAL | Status: DC

## 2014-11-29 MED ORDER — KETOROLAC TROMETHAMINE 60 MG/2ML IM SOLN
60.0000 mg | Freq: Once | INTRAMUSCULAR | Status: DC
  Filled 2014-11-29: qty 2

## 2014-11-29 MED ORDER — IBUPROFEN 200 MG PO TABS
800.0000 mg | ORAL_TABLET | Freq: Three times a day (TID) | ORAL | Status: DC
  Administered 2014-11-29 – 2014-11-30 (×3): 800 mg via ORAL
  Filled 2014-11-29 (×3): qty 4

## 2014-11-29 NOTE — Evaluation (Signed)
Physical Therapy Evaluation Patient Details Name: Tracy Larson MRN: [id number] DOB: 1955-12-09 Today's Date: 11/29/2014   History of Present Illness  Pt is a 59 y/o F presenting w/ severe back pain and fecal/urinary incontinence since 11/18/14. MRI shows only moderate canal stenosis at L4-5, mild at L3-4 and L5-S1. Neural foraminal narrowing L2-3 through L5-S1: Moderate to severe at L4-5.  Pt's PMH includes hyptension, and congenital adrenal hyperplasia.  Clinical Impression  Pt admitted with above diagnosis. Pt currently with functional limitations due to the deficits listed below (see PT Problem List). Pt w/ severe anxiety throughout session w/ all mobility 2/2 severe pain.  Pt's ambulatory distance limited 2/2 pain and pt will benefit from review of log roll technique next session.  Patient needs to practice stairs next session.  Pt will benefit from skilled PT to increase their independence and safety with mobility to allow discharge to the venue listed below.      Follow Up Recommendations Outpatient PT;Supervision - Intermittent (OPPT when appropriate)    Equipment Recommendations  Rolling walker with 5" wheels    Recommendations for Other Services OT consult     Precautions / Restrictions Precautions Precautions: Back;Fall Precaution Booklet Issued: Yes (comment) Precaution Comments: Reviewed no bending, arching, lifting, twisting Restrictions Weight Bearing Restrictions: No      Mobility  Bed Mobility Overal bed mobility: Needs Assistance Bed Mobility: Rolling;Sidelying to Sit;Sit to Sidelying Rolling: Min guard Sidelying to sit: Min guard     Sit to sidelying: Min guard General bed mobility comments: Min guard and verbal cues for proper log roll technique.  Pt prefers to lie on her R side.  Mod use of bed railings to push up to sitting.  Pt does not perform log roll despite verbal cues to do so to return to supine after ambulation. Instead, pt sits EOB and lays back  (does avoid twisting).   Transfers Overall transfer level: Needs assistance Equipment used: Rolling walker (2 wheeled) Transfers: Sit to/from Stand Sit to Stand: Min guard         General transfer comment: Pt requests RW to be higher, educated pt on reason for proper RW height to avoid bending or huching shoulders.  Pt requires verbal cues to push from bed rather than pulling on RW during sit>stand.  Ambulation/Gait Ambulation/Gait assistance: Min guard Ambulation Distance (Feet): 45 Feet Assistive device: Rolling walker (2 wheeled) Gait Pattern/deviations: Step-through pattern;Shuffle;Decreased stride length;Antalgic;Trunk flexed;Narrow base of support Gait velocity: very decreased Gait velocity interpretation: Below normal speed for age/gender General Gait Details: Pt w/ extremely gaurded posture.  Cues to relax shoulders and to not lean on RW to avoid bending trunk.  Pt requests to sit after ambulating 25 ft 2/2 pain.  Increased RR during ambulation despite verbal cues for breathing technique, pt w/ high anxiety w/ ambulation.  Stairs            Wheelchair Mobility    Modified Rankin (Stroke Patients Only)       Balance Overall balance assessment: Needs assistance Sitting-balance support: Bilateral upper extremity supported;Feet supported Sitting balance-Leahy Scale: Fair Sitting balance - Comments: Pt able to maintain balance during MMT sitting EOB holding onto bed railing and bed   Standing balance support: Bilateral upper extremity supported;During functional activity Standing balance-Leahy Scale: Fair                               Pertinent Vitals/Pain Pain Assessment: 0-10 Pain Score: 9  Pain Location: center of back up and down spine Pain Descriptors / Indicators: Throbbing Pain Intervention(s): Limited activity within patient's tolerance;Monitored during session;Repositioned;Patient requesting pain meds-RN notified;RN gave pain meds during  session;Heat applied    Home Living Family/patient expects to be discharged to:: Private residence Living Arrangements: Alone Available Help at Discharge: Family;Available PRN/intermittently (daughter lives nearby) Type of Home: House Home Access: Stairs to enter Entrance Stairs-Rails: None Secretary/administrator of Steps: 2 Home Layout: One level Home Equipment: None      Prior Function Level of Independence: Independent               Hand Dominance   Dominant Hand: Right    Extremity/Trunk Assessment   Upper Extremity Assessment: Defer to OT evaluation           Lower Extremity Assessment: Generalized weakness;RLE deficits/detail;LLE deficits/detail RLE Deficits / Details: DF, PF, knee F/E, hip F/Abd/Add all 3/5 2/2 pain in lower back LLE Deficits / Details: DF, PF, knee F/E, hip F/Abd/Add all 3/5 2/2 pain in lower back  Cervical / Trunk Assessment: Other exceptions  Communication   Communication: No difficulties  Cognition Arousal/Alertness: Awake/alert Behavior During Therapy: Anxious Overall Cognitive Status: Within Functional Limits for tasks assessed                      General Comments General comments (skin integrity, edema, etc.): (-) L SLR, (+) R SLR w/ pain in central low back.  Pt additionally c/o R knee pain during R SLR which she says she does not remember having prior to injury and says it feels like joint pain, RN notified.Pt w/ severe anxiety w/ all mobility and requires education for relaxation and breathing techniques.  Pt politely refused to sit in chair (sat in chair for 1 min) and requested to return to bed.    Exercises        Assessment/Plan    PT Assessment Patient needs continued PT services  PT Diagnosis Difficulty walking;Abnormality of gait;Generalized weakness;Acute pain   PT Problem List Decreased strength;Decreased range of motion;Decreased activity tolerance;Decreased balance;Decreased mobility;Decreased  coordination;Decreased knowledge of use of DME;Decreased safety awareness;Decreased knowledge of precautions;Cardiopulmonary status limiting activity;Impaired sensation;Pain  PT Treatment Interventions DME instruction;Gait training;Stair training;Functional mobility training;Therapeutic activities;Therapeutic exercise;Balance training;Neuromuscular re-education;Patient/family education;Modalities   PT Goals (Current goals can be found in the Care Plan section) Acute Rehab PT Goals Patient Stated Goal: to have pain decrease PT Goal Formulation: With patient Time For Goal Achievement: 12/06/14 Potential to Achieve Goals: Good    Frequency Min 3X/week   Barriers to discharge Inaccessible home environment;Decreased caregiver support Intermittent assist at home and 2 steps to enter home    Co-evaluation               End of Session   Activity Tolerance: Patient limited by pain;Other (comment) (anxiety) Patient left: in bed;with call bell/phone within reach Nurse Communication: Mobility status;Precautions;Other (comment) (pt's anxiety and severe pain/hypersensitivity)    Functional Assessment Tool Used: clinical judgement Functional Limitation: Mobility: Walking and moving around Mobility: Walking and Moving Around Current Status [phone]): At least 20 percent but less than 40 percent impaired, limited or restricted Mobility: Walking and Moving Around Goal Status [phone]): At least 1 percent but less than 20 percent impaired, limited or restricted    Time: 0918-0952 PT Time Calculation (min) (ACUTE ONLY): 34 min   Charges:   PT Evaluation $Initial PT Evaluation Tier I: 1 Procedure PT Treatments $Therapeutic Activity: 8-22 mins  PT G Codes:   PT G-Codes **NOT FOR INPATIENT CLASS** Functional Assessment Tool Used: clinical judgement Functional Limitation: Mobility: Walking and moving around Mobility: Walking and Moving Around Current Status ([id number]([id number]): At least 20 percent but less  than 40 percent impaired, limited or restricted Mobility: Walking and Moving Around Goal Status [phone](G8979): At least 1 percent but less than 20 percent impaired, limited or restricted   Michail JewelsAshley Parr PT, DPT [phone] Pager: [phone] 11/29/2014, 10:13 AM

## 2014-11-29 NOTE — Progress Notes (Signed)
**  Interval Note**  I went by to check in on Ms Tracy Larson this afternoon.  Patient was resting in bed on her side, NAD, pleasant, continued thoracic paraspinal TTP.  Patient reported to me that she was able to use breathing techniques for pain after PT today.  She states that the toradol injection came very late and by the time that it got to the floor, she no longer needed it.  She reports that pain is well relieved by pain medication she has been getting.  When I inquired about her urinary incontinence, she reports that she knows she has to go to the bathroom she just does not make it in time.  Denies any other concerns at this time.  Patient wants to continue with PT in am.  Will start Motrin 800mg  TID, since Toradol not given.  Ashly M. Nadine CountsGottschalk, DO PGY-1, [facility]

## 2014-11-29 NOTE — Progress Notes (Signed)
Family Medicine Teaching Service Daily Progress Note Intern Pager: [phone]  Patient name: Tracy Larson Medical record number: [id number] Date of birth: 12-12-1955 Age: 59 y.o. Gender: female  Primary Care Provider: No PCP Per Patient Consultants: None Code Status: Full  Pt Overview and Major Events to Date:  5/28 Admitted for severe back pain, incontinence worrisome for cauda equina 5/30: MRI Spine neg for impingement   Assessment and Plan: Tracy PringleJennifer Johannes is a 59 y.o. 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 History of adrenal hyperplasia, arthritis and obesity.   Back pain: Constellation of symptoms (with urinary and fecal incontinence); no saddle anesthesia or abnormal reflexes, and normal rectal tone. MRI shows only moderate canal stenosis at L4-5, mild at L3-4 and L5-S1.  Neural foraminal narrowing L2-3 through L5-S1: Moderate to severe at L4-5.  - Per discussion with Dr. Franky Machoabbell of neurosurgery who reviewed the case including imaging, pt does not have an operative indication and does not need to follow up for this.  - Treat symptomatically with zanaflex ( and oxyIR - Physical therapy consulted  Urinary incontinence: More consistent with urge incontinence than overflow. UA unremarkable.  - Post void I/O cath bladder volume = 25 (unlikely due to cord compression)  Fecal incontinence: With normal rectal tone, this is not attributable to neurogenic etiology.  - Continue to monitor, consider C diff if diarrhea develops  Hypotension: With baseline low pressures in outpatient notes. Mild tachycardia (90-100 bpm on admit) - resolved with IVF's.  No other signs of adrenal crisis (Electrolytes and glucose normal, no fever, no abdominal pain.)  - Continue telemetry  Congenital adrenal hyperplasia:  On lifelong glucocorticoid therapy. Has had 2 adrenal crises (1985, 2005). AM cortisol at goal 11.9  - Continue current  home hydrocortisone 15mg  bid (was lowered from 20mg  BID at Endocrinology OV 2/4) - No need for stress dose  FEN/GI: Saline lock since tolerating regular diet.  Prophylaxis: subQ heparin  Disposition: Discharge pending pain control given absence of surgical indication.  Subjective:  Continues to report mid thoracic and lumbar back pain. She was able to get out of bed this morning and "clean up" which caused her back to hurt more. Continues to denies any radiation to legs or numbness/weakness.   Objective: Temp:  [97.8 F (36.6 C)-98.1 F (36.7 C)] 97.8 F (36.6 C) (05/30 0539) Pulse Rate:  [67-85] 67 (05/30 0539) Resp:  [16-18] 16 (05/30 0539) BP: (97-102)/(50-56) 97/50 mmHg (05/30 0539) SpO2:  [99 %-100 %] 100 % (05/30 0539) Physical Exam: General: Well-appearing [age] yo female; lying in bed; no distress.  Cardiovascular: Regular rate, no murmur, no LE edema Respiratory: Nonlabored, clear distant breath sounds.  Abdomen: +BS, soft, NT, ND Back: No tenderness to vertebral process palpation. Diffuse paraspinous muscles tender to light touch in throacic back.   Neuro: Alert and oriented x4; Gross LE Motor and Sensation intact bilaterally   Laboratory:  Recent Labs Lab 11/27/14 1732  WBC 9.4  HGB 13.8  HCT 42.5  PLT 291    Recent Labs Lab 11/27/14 1732  NA 137  K 3.6  CL 105  CO2 22  BUN 6  CREATININE 0.64  CALCIUM 8.6*  GLUCOSE 104*   Imaging/Diagnostic Tests: MRI THORACIC SPINE: Early degenerative change of the thoracic spine without acute fracture, malalignment or neurocompressive changes. No suspicious enhancement.  Asymmetric RIGHT periscapular severe muscle atrophy (possibly infra spinatus versus latissimus dorsi).    MRI LUMBAR SPINE: Grade 1  L4-5 anterolisthesis on degenerative basis without spondylolysis. No acute fracture. Probable L2-3 interspinous ligament strain.  Fibro lipomatosis changes of the filum terminale without cord tethering.  Moderate canal  stenosis at L4-5, mild at L3-4 and L5-S1.  Neural foraminal narrowing L2-3 through L5-S1: Moderate to severe at L4-5.  L2-3 and L5-S1 annular fissures.  No suspicious enhancement.     Jamal Collin, MD 11/29/2014, 9:09 AM PGY-2, [facility] Intern pager: [phone], text pages welcome

## 2014-11-29 NOTE — Progress Notes (Signed)
Nutrition Brief Note  Patient identified on the Malnutrition Screening Tool (MST) Report  Wt Readings from Last 15 Encounters:  11/27/14 247 lb (112.038 kg)  11/27/14 246 lb 9.6 oz (111.857 kg)  11/17/14 246 lb (111.585 kg)  11/11/14 246 lb (111.585 kg)  11/08/14 250 lb 3.2 oz (113.49 kg)  08/05/14 242 lb 12.8 oz (110.133 kg)  06/04/14 239 lb (108.41 kg)  03/02/14 240 lb 6.4 oz (109.045 kg)  02/19/14 239 lb 9.6 oz (108.682 kg)    Body mass index is 45.17 kg/(m^2). Patient meets criteria for Morbid obesity based on current BMI. Weight stable.  Current diet order is regular, patient is consuming approximately 100% of meals at this time. Pt reports having a good appetite. Labs and medications reviewed.   No nutrition interventions warranted at this time. If nutrition issues arise, please consult RD.   Marijean NiemannStephanie La, MS, RD, LDN Pager # [phone] After hours/ weekend pager # [phone]

## 2014-11-29 NOTE — Discharge Summary (Signed)
Family Medicine Teaching [facility] Discharge Summary  Patient name: Tracy Larson Medical record number: [id number] Date of birth: [birth year] Age: 59 y.o. Gender: female Date of Admission: 11/27/2014  Date of Discharge: 11/30/2014 Admitting Physician: Tobey Grim, MD  Primary Care Provider: No PCP Per Patient Consultants: none  Indication for Hospitalization: severe back pain, incontinence concerning for cauda equina  Discharge Diagnoses/Problem List:  Morbid obesity Bilateral thoracic back pain Notalgia Bladder incontinence CAG  Disposition: Discharge home  Discharge Condition: Stable  Discharge Exam:  Temp: [98 F (36.7 C)] 98 F (36.7 C) (05/31 0615) Pulse Rate: [59-67] 59 (05/31 0615) Resp: [16-114] 114 (05/31 0615) BP: (88-103)/(47-55) 103/47 mmHg (05/31 0615) SpO2: [97 %-100 %] 100 % (05/31 0615) Physical Exam: General: Well-appearing female, lying in bed; no distress.  Cardiovascular: RRR, no murmur, no LE edema Respiratory: Nonlabored, clear distant breath sounds.  Abdomen: obese, +BS, soft, NT/ ND Back: No tenderness to vertebral process palpation. Diffuse paraspinous muscles tender to very light touch in throacic back, particularly T8-12.  MSK: using walker for ambulation, AROM in rotation in tact, flexion limited 2/2 pain. Neuro: Alert and oriented x4; Gross LE Motor and Sensation intact bilaterally  Psych: good eye contact, normal speech Skin: dry, intact, no rashes  Brief Hospital Course:  Tracy Larson is a 59 y.o. female that presented with back pain since 05/08 after lifting a client and fecal/urinary incontinence since 05/19. History of adrenal hyperplasia, arthritis and obesity.   On admission, MRI showed moderate canal stenosis at L4-5, L5-S1 and neural foraminal narrowing at L2-S1, moderate to severe at L4-5.  Patient's case was discussed with Dr Franky Macho, neurosurgeon, who did not believe that patient had indication for surgery.   Recommendation was to treat symptoms with a muscle relaxer and pain medication  PT was consulted, who recommended that patient continue with PT outpatient.  A rolling walker and 3in1 bedside commode was also provided for home use.  Patient's urinary incontinence with thought to be urge incontinence.  Post void residual was obtained and was low, and therefore symptoms were not believed to be 2/2 cord compression.  Patient had normal rectal tone.  Patient was grossly neurologically intact.    Patient was noted to be hypotensive on admission with mild tachycardia to 90's to 100's.  After reviewing outpatient notes, it was found that patient had low pressures at baseline.  Patient had no other signs of adrenal crisis.  She was continued on her home hydrocortisone for CAH and no stress doses were needed.  AM cortisol was obtained and was at goal at 11.9.    Patient was discharged in stable condition with recommendations to follow up with PCP for continued management of back pain.  Discharge instructions and return precautions were reviewed with patient, who voiced good understanding.  Issues for Follow Up:  1. Will need outpatient PT  Significant Procedures: none  Significant Labs and Imaging:   Recent Labs Lab 11/27/14 1732  WBC 9.4  HGB 13.8  HCT 42.5  PLT 291    Recent Labs Lab 11/27/14 1732  NA 137  K 3.6  CL 105  CO2 22  GLUCOSE 104*  BUN 6  CREATININE 0.64  CALCIUM 8.6*   Mr Thoracic Spine W Wo Contrast  11/27/2014   CLINICAL DATA:  Back injury Nov 07, 2014 while lifting a client, incontinence beginning Nov 18, 2014, worsening pain. History of adrenal hyperplasia, arthritis and obesity.  EXAM: MRI THORACIC AND LUMBAR SPINE WITHOUT AND WITH CONTRAST  TECHNIQUE: Multiplanar and multiecho pulse sequences of the thoracic and lumbar spine were obtained without and with intravenous contrast.  CONTRAST:  20mL MULTIHANCE GADOBENATE DIMEGLUMINE 529 MG/ML IV SOLN  COMPARISON:  CT abdomen and  pelvis Nov 10, 2014  FINDINGS: MR THORACIC SPINE FINDINGS  Larger body habitus results in overall decreased signal to noise ratio. Thoracic vertebral bodies and posterior elements are intact and aligned with maintenance of thoracic kyphosis. Mild T10-11 disc height loss, with slight decreased T2 signal within the mid to lower thoracic disc consistent with mild desiccation. Very mild chronic discogenic endplate changes T7-8 through all T10-11 without STIR signal abnormality to suggest acute osseous process. No abnormal osseous nor intradiscal enhancement. Multiple perineural cysts, largest 6 mm RIGHT C8 perineural cyst.  Thoracic spinal cord appears normal morphology and signal characteristics the level of conus medullaris which terminates at L1. No abnormal cord, leptomeningeal or extra-axial enhancement. Partially imaged RIGHT is severe periscapular muscle atrophy.  Very small broad-based disc bulges T7-8, T8-9, T10-11. Mild lower thoracic facet arthropathy. No canal stenosis or neural foraminal narrowing at any level.  MR LUMBAR SPINE FINDINGS  Lumbar vertebral bodies intact. Grade 1 L4-5 anterolisthesis (4 mm) without spondylolysis. Mild L2-3 through L4-5, moderate L5-S1 disc height loss, mildly decreased T2 signal within the lower lumbar disc consistent mild desiccation. Mild chronic discogenic endplate changes L2-3 through L5-S1 without STIR signal abnormality to suggest acute osseous process. No abnormal osseous or intradiscal enhancement.  Faint bright STIR signal and enhancement within the L2-3 interspinous space may reflect ligamentous injury. Conus medullaris terminates at L1 and appears normal in morphology and signal characteristics. Tubular bright T1 signal along the filum terminale measuring up to 3-4 mm, associated with chemical shift artifact consistent with fibro lipomatosis changes. No cord tethering. Included prevertebral soft tissues unremarkable. Moderate symmetric paraspinal muscle atrophy.   Level by level evaluation:  L1-2:  No disc bulge, canal stenosis or neural foraminal narrowing.  L2-3: Small broad-based disc bulge asymmetric to the RIGHT. Enhancing annular fissure. Mild facet arthropathy and ligamentum flavum redundancy without canal stenosis. Mild to moderate bilateral neural foraminal narrowing.  L3-4: Small broad-based disc bulge. Moderate facet arthropathy and ligamentum flavum redundancy. Mild canal stenosis. Mild to moderate RIGHT, mild LEFT neural foraminal narrowing.  L4-5: Anterolisthesis. Small broad-based disc bulge. Severe facet arthropathy and ligamentum flavum redundancy with trace LEFT facet effusion which is likely reactive. Moderate canal stenosis and trefoil configuration with RIGHT greater LEFT lateral recess effacement which likely affects the traversing L5 nerves. In addition, there is encroachment upon the exited RIGHT L4 nerve. Moderate to severe RIGHT greater than LEFT neural foraminal narrowing.  L5-S1: Transitional anatomy, partially sacralized L5 vertebral body. Small broad-based disc bulge. Enhancing annular fissure. Moderate facet arthropathy. Mild canal stenosis, encroachment upon the traversing S1 nerves. Mild RIGHT, moderate LEFT neural foraminal narrowing.  IMPRESSION: MRI THORACIC SPINE: Early degenerative change of the thoracic spine without acute fracture, malalignment or neurocompressive changes. No suspicious enhancement.  Asymmetric RIGHT periscapular severe muscle atrophy (possibly infra spinatus versus latissimus dorsi).  MRI LUMBAR SPINE: Grade 1 L4-5 anterolisthesis on degenerative basis without spondylolysis. No acute fracture. Probable L2-3 interspinous ligament strain.  Fibro lipomatosis changes of the filum terminale without cord tethering.  Moderate canal stenosis at L4-5, mild at L3-4 and L5-S1.  Neural foraminal narrowing L2-3 through L5-S1: Moderate to severe at L4-5.  L2-3 and L5-S1 annular fissures.  No suspicious enhancement.   Electronically  Signed   By: Michel Santee.D.  On: 11/27/2014 22:18   Mr Lumbar Spine W Wo Contrast  11/27/2014   CLINICAL DATA:  Back injury Nov 07, 2014 while lifting a client, incontinence beginning Nov 18, 2014, worsening pain. History of adrenal hyperplasia, arthritis and obesity.  EXAM: MRI THORACIC AND LUMBAR SPINE WITHOUT AND WITH CONTRAST  TECHNIQUE: Multiplanar and multiecho pulse sequences of the thoracic and lumbar spine were obtained without and with intravenous contrast.  CONTRAST:  20mL MULTIHANCE GADOBENATE DIMEGLUMINE 529 MG/ML IV SOLN  COMPARISON:  CT abdomen and pelvis Nov 10, 2014  FINDINGS: MR THORACIC SPINE FINDINGS  Larger body habitus results in overall decreased signal to noise ratio. Thoracic vertebral bodies and posterior elements are intact and aligned with maintenance of thoracic kyphosis. Mild T10-11 disc height loss, with slight decreased T2 signal within the mid to lower thoracic disc consistent with mild desiccation. Very mild chronic discogenic endplate changes T7-8 through all T10-11 without STIR signal abnormality to suggest acute osseous process. No abnormal osseous nor intradiscal enhancement. Multiple perineural cysts, largest 6 mm RIGHT C8 perineural cyst.  Thoracic spinal cord appears normal morphology and signal characteristics the level of conus medullaris which terminates at L1. No abnormal cord, leptomeningeal or extra-axial enhancement. Partially imaged RIGHT is severe periscapular muscle atrophy.  Very small broad-based disc bulges T7-8, T8-9, T10-11. Mild lower thoracic facet arthropathy. No canal stenosis or neural foraminal narrowing at any level.  MR LUMBAR SPINE FINDINGS  Lumbar vertebral bodies intact. Grade 1 L4-5 anterolisthesis (4 mm) without spondylolysis. Mild L2-3 through L4-5, moderate L5-S1 disc height loss, mildly decreased T2 signal within the lower lumbar disc consistent mild desiccation. Mild chronic discogenic endplate changes L2-3 through L5-S1 without STIR  signal abnormality to suggest acute osseous process. No abnormal osseous or intradiscal enhancement.  Faint bright STIR signal and enhancement within the L2-3 interspinous space may reflect ligamentous injury. Conus medullaris terminates at L1 and appears normal in morphology and signal characteristics. Tubular bright T1 signal along the filum terminale measuring up to 3-4 mm, associated with chemical shift artifact consistent with fibro lipomatosis changes. No cord tethering. Included prevertebral soft tissues unremarkable. Moderate symmetric paraspinal muscle atrophy.  Level by level evaluation:  L1-2:  No disc bulge, canal stenosis or neural foraminal narrowing.  L2-3: Small broad-based disc bulge asymmetric to the RIGHT. Enhancing annular fissure. Mild facet arthropathy and ligamentum flavum redundancy without canal stenosis. Mild to moderate bilateral neural foraminal narrowing.  L3-4: Small broad-based disc bulge. Moderate facet arthropathy and ligamentum flavum redundancy. Mild canal stenosis. Mild to moderate RIGHT, mild LEFT neural foraminal narrowing.  L4-5: Anterolisthesis. Small broad-based disc bulge. Severe facet arthropathy and ligamentum flavum redundancy with trace LEFT facet effusion which is likely reactive. Moderate canal stenosis and trefoil configuration with RIGHT greater LEFT lateral recess effacement which likely affects the traversing L5 nerves. In addition, there is encroachment upon the exited RIGHT L4 nerve. Moderate to severe RIGHT greater than LEFT neural foraminal narrowing.  L5-S1: Transitional anatomy, partially sacralized L5 vertebral body. Small broad-based disc bulge. Enhancing annular fissure. Moderate facet arthropathy. Mild canal stenosis, encroachment upon the traversing S1 nerves. Mild RIGHT, moderate LEFT neural foraminal narrowing.  IMPRESSION: MRI THORACIC SPINE: Early degenerative change of the thoracic spine without acute fracture, malalignment or neurocompressive  changes. No suspicious enhancement.  Asymmetric RIGHT periscapular severe muscle atrophy (possibly infra spinatus versus latissimus dorsi).  MRI LUMBAR SPINE: Grade 1 L4-5 anterolisthesis on degenerative basis without spondylolysis. No acute fracture. Probable L2-3 interspinous ligament strain.  Fibro lipomatosis changes  of the filum terminale without cord tethering.  Moderate canal stenosis at L4-5, mild at L3-4 and L5-S1.  Neural foraminal narrowing L2-3 through L5-S1: Moderate to severe at L4-5.  L2-3 and L5-S1 annular fissures.  No suspicious enhancement.   Electronically Signed   By: Awilda Metro M.D.   On: 11/27/2014 22:18    Results/Tests Pending at Time of Discharge: none  Discharge Medications:    Medication List    STOP taking these medications        cyclobenzaprine 10 MG tablet  Commonly known as:  FLEXERIL     diazepam 5 MG tablet  Commonly known as:  VALIUM      TAKE these medications        GINSENG COMPLEX/ROYAL JELLY PO  Take 800 mg by mouth 2 (two) times daily.     hydrocortisone 10 MG tablet  Commonly known as:  CORTEF  Take 10 mg by mouth daily.     ibuprofen 800 MG tablet  Commonly known as:  ADVIL,MOTRIN  Take 1 tablet (800 mg total) by mouth 3 (three) times daily.     oxyCODONE 5 MG immediate release tablet  Commonly known as:  Oxy IR/ROXICODONE  Take 1-2 tablets (5-10 mg total) by mouth every 4 (four) hours as needed for breakthrough pain.     tiZANidine 4 MG tablet  Commonly known as:  ZANAFLEX  Take 1 tablet (4 mg total) by mouth every 8 (eight) hours as needed for muscle spasms.     traMADol 50 MG tablet  Commonly known as:  ULTRAM  Take 1 tablet (50 mg total) by mouth every 6 (six) hours as needed for moderate pain.     vitamin B-12 500 MCG tablet  Commonly known as:  CYANOCOBALAMIN  Take 500 mcg by mouth daily.        Discharge Instructions: Please refer to Patient Instructions section of EMR for full details.  Patient was counseled  important signs and symptoms that should prompt return to medical care, changes in medications, dietary instructions, activity restrictions, and follow up appointments.   Follow-Up Appointments: Follow-up Information    Follow up with LE, THAO PHUONG, DO. Schedule an appointment as soon as possible for a visit in 1 week.   Specialty:  Family Medicine   Why:  hospital follow up   Contact information:   [address] [place] [address] [phone]       Raliegh Ip, DO 12/02/2014, 4:33 PM PGY-1, [facility]

## 2014-11-30 DIAGNOSIS — M549 Dorsalgia, unspecified: Secondary | ICD-10-CM | POA: Diagnosis not present

## 2014-11-30 DIAGNOSIS — M546 Pain in thoracic spine: Secondary | ICD-10-CM | POA: Insufficient documentation

## 2014-11-30 MED ORDER — IBUPROFEN 800 MG PO TABS: 800.0000 mg | ORAL_TABLET | Freq: Three times a day (TID) | ORAL | Status: DC

## 2014-11-30 MED ORDER — OXYCODONE HCL 5 MG PO TABS: 5.0000 mg | ORAL_TABLET | ORAL | Status: DC | PRN

## 2014-11-30 MED ORDER — TRAMADOL HCL 50 MG PO TABS: 50.0000 mg | ORAL_TABLET | Freq: Four times a day (QID) | ORAL | Status: DC | PRN

## 2014-11-30 MED ORDER — TIZANIDINE HCL 4 MG PO TABS: 4.0000 mg | ORAL_TABLET | Freq: Three times a day (TID) | ORAL | Status: DC | PRN

## 2014-11-30 NOTE — Progress Notes (Signed)
Physical Therapy Treatment Patient Details Name: Tracy PringleJennifer Larson MRN: [id number] DOB: 09/29/1955 Today's Date: 11/30/2014    History of Present Illness Pt is a 59 y/o F presenting w/ severe back pain and fecal/urinary incontinence since 11/18/14. MRI shows only moderate canal stenosis at L4-5, mild at L3-4 and L5-S1. Neural foraminal narrowing L2-3 through L5-S1: Moderate to severe at L4-5.  Pt's PMH includes hyptension, and congenital adrenal hyperplasia.    PT Comments    Pt demonstrated ability to ambulate 30 ft and ascend/descend 2 steps this session; limited by pain and anxiety. Pt seems to be unaware of deficits.  Had extensive conversation w/ pt about potentially staying at her daughter's house temporarily (daughter available for assist prn) for safety.  Pt does not seem to understand that she is not safe to be ambulating at home alone now and says, "I will put chairs all over my house so I can sit when I want".  Explained that this is not a safe option and that the best option is to have assist temporarily.  Pt agreed to call her daughter to inquire about staying with her.  Pt should practice log roll next session w/o cues.  Pt will benefit from continued skilled PT services to increase functional independence and safety.   Follow Up Recommendations  Outpatient PT;Supervision - Intermittent     Equipment Recommendations  Rolling walker with 5" wheels    Recommendations for Other Services       Precautions / Restrictions Precautions Precautions: Back;Fall Precaution Comments: Pt able to recall back precuations Restrictions Weight Bearing Restrictions: No    Mobility  Bed Mobility Overal bed mobility: Needs Assistance Bed Mobility: Rolling;Sidelying to Sit;Sit to Sidelying Rolling: Supervision Sidelying to sit: Supervision     Sit to sidelying: Supervision General bed mobility comments: Pt performed log roll technique correctly w/o verbal cues getting into and out of bed  at beginning of session; however, pt required max verbal cues at end of session to perform log roll correctly to return to sidelying (likely 2/2 pt's anxiety about her pain)  Transfers Overall transfer level: Needs assistance Equipment used: Rolling walker (2 wheeled) Transfers: Sit to/from Stand Sit to Stand: Supervision         General transfer comment: Supervision for safety.  Pt required cues for pushing from bed and not pulling on RW  Ambulation/Gait Ambulation/Gait assistance: Min guard Ambulation Distance (Feet): 30 Feet Assistive device: Rolling walker (2 wheeled) Gait Pattern/deviations: Step-through pattern;Decreased stride length;Shuffle;Antalgic;Trunk flexed Gait velocity: very decreased Gait velocity interpretation: Below normal speed for age/gender General Gait Details: Pt w/ extremely guarded posture.  Pt requests to sit after ambulating 30 ft 2/2 severe pain in back and shooting down BLEs.  Pt is impulsive and sits in chair before chair locked (chair was supported).     Stairs Stairs: Yes Stairs assistance: Min assist Stair Management: No rails;Forwards;Step to pattern Number of Stairs: 2 General stair comments: Demonstration and verbal cues for technique, hand held assist x1   Wheelchair Mobility    Modified Rankin (Stroke Patients Only)       Balance Overall balance assessment: Needs assistance Sitting-balance support: Bilateral upper extremity supported;Feet supported Sitting balance-Leahy Scale: Fair     Standing balance support: Bilateral upper extremity supported;During functional activity Standing balance-Leahy Scale: Fair                      Cognition Arousal/Alertness: Awake/alert Behavior During Therapy: Anxious Overall Cognitive Status: Within Functional Limits for  tasks assessed                      Exercises      General Comments General comments (skin integrity, edema, etc.): Pt seems to be unaware of deficits.   Had extensive conversation w/ pt about potentially staying at her daughter's house temporarily (daughter available for assist prn) for safety.  Pt does not seem to understand that she is not safe to be ambulating at home alone now and says, "I will put chair all over my house so I can sit when I want".  Explained that this is not a safe option and that the best option is to have assist temporarily.  Pt agreed to call her daughter to inquire about staying with her.      Pertinent Vitals/Pain Pain Assessment: 0-10 Pain Score: 8  Pain Location: lower back and down legs w/ ambulation Pain Descriptors / Indicators: Aching;Shooting;Grimacing;Guarding Pain Intervention(s): Limited activity within patient's tolerance;Monitored during session;Repositioned    Home Living Family/patient expects to be discharged to:: Private residence Living Arrangements: Alone Available Help at Discharge: Family;Available PRN/intermittently (daughter lives by) Type of Home: House Home Access: Stairs to enter Entrance Stairs-Rails: None Home Layout: One level Home Equipment: None      Prior Function Level of Independence: Independent          PT Goals (current goals can now be found in the care plan section) Acute Rehab PT Goals Patient Stated Goal: decrease pain Progress towards PT goals: Progressing toward goals    Frequency  Min 3X/week    PT Plan Current plan remains appropriate    Co-evaluation             End of Session   Activity Tolerance: Patient limited by pain;Other (comment) (anxiety) Patient left: in bed;with call bell/phone within reach     Time: 1153-1216 PT Time Calculation (min) (ACUTE ONLY): 23 min  Charges:  $Gait Training: 23-37 mins                    G Codes:      Michail Jewels PT, [place] [phone] Pager: [phone] 11/30/2014, 12:27 PM

## 2014-11-30 NOTE — Progress Notes (Addendum)
D/C instructions and scripts given. Pt reviewed paperwork and felt comfortable with her discharge. Note given from MD for work excuse as well. Pt assisted to a wheelchair and taken to daughter car at this time.

## 2014-11-30 NOTE — Discharge Instructions (Signed)
You were admitted for severe back pain.  Imaging studies were obtained of your back, which showed no surgical emergencies.  You were seen by physical therapy, who recommend that you continue PT outpatient.  You are being discharged with a supply of pain medication and muscle relaxers.  You should follow up with your Primary doctor in the next week for continued monitoring and management.   Back Exercises Back exercises help treat and prevent back injuries. The goal is to increase your strength in your belly (abdominal) and back muscles. These exercises can also help with flexibility. Start these exercises when told by your doctor. HOME CARE Back exercises include: Pelvic Tilt.  Lie on your back with your knees bent. Tilt your pelvis until the lower part of your back is against the floor. Hold this position 5 to 10 sec. Repeat this exercise 5 to 10 times. Knee to Chest.  Pull 1 knee up against your chest and hold for 20 to 30 seconds. Repeat this with the other knee. This may be done with the other leg straight or bent, whichever feels better. Then, pull both knees up against your chest. Sit-Ups or Curl-Ups.  Bend your knees 90 degrees. Start with tilting your pelvis, and do a partial, slow sit-up. Only lift your upper half 30 to 45 degrees off the floor. Take at least 2 to 3 seonds for each sit-up. Do not do sit-ups with your knees out straight. If partial sit-ups are difficult, simply do the above but with only tightening your belly (abdominal) muscles and holding it as told. Hip-Lift.  Lie on your back with your knees flexed 90 degrees. Push down with your feet and shoulders as you raise your hips 2 inches off the floor. Hold for 10 seconds, repeat 5 to 10 times. Back Arches.  Lie on your stomach. Prop yourself up on bent elbows. Slowly press on your hands, causing an arch in your low back. Repeat 3 to 5 times. Shoulder-Lifts.  Lie face down with arms beside your body. Keep hips and belly  pressed to floor as you slowly lift your head and shoulders off the floor. Do not overdo your exercises. Be careful in the beginning. Exercises may cause you some mild back discomfort. If the pain lasts for more than 15 minutes, stop the exercises until you see your doctor. Improvement with exercise for back problems is slow.  Document Released: 07/21/2010 Document Revised: 09/10/2011 Document Reviewed: 04/19/2011 [facility] Patient Information 2015 [place], [place]. This information is not intended to replace advice given to you by your health care provider. Make sure you discuss any questions you have with your health care provider.  Back Injury Prevention The following tips can help you to prevent a back injury. PHYSICAL FITNESS  Exercise often. Try to develop strong stomach (abdominal) muscles.  Do aerobic exercises often. This includes walking, jogging, biking, swimming.  Do exercises that help with balance and strength often. This includes tai chi and yoga.  Stretch before and after you exercise.  Keep a healthy weight. DIET   Ask your doctor how much calcium and vitamin D you need every day.  Include calcium in your diet. Foods high in calcium include dairy products; green, leafy vegetables; and products with calcium added (fortified).  Include vitamin D in your diet. Foods high in vitamin D include milk and products with vitamin D added.  Think about taking a multivitamin or other nutritional products called " supplements."  Stop smoking if you smoke. POSTURE  Sit and stand up straight. Avoid leaning forward or hunching over.  Choose chairs that support your lower back.  If you work at a desk:  Sit close to your work so you do not lean over.  Keep your chin tucked in.  Keep your neck drawn back.  Keep your elbows bent at a right angle. Your arms should look like the letter "L."  Sit high and close to the steering wheel when you drive. Add low back support to your  car seat if needed.  Avoid sitting or standing in one position for too long. Get up and move around every hour. Take breaks if you are driving for a long time.  Sleep on your side with your knees slightly bent. You can also sleep on your back with a pillow under your knees. Do not sleep on your stomach. LIFTING, TWISTING, AND REACHING  Avoid heavy lifting, especially lifting over and over again. If you must do heavy lifting:  Stretch before lifting.  Work slowly.  Rest between lifts.  Use carts and dollies to move objects when possible.  Make several small trips instead of carrying 1 heavy load.  Ask for help when you need it.  Ask for help when moving big, awkward objects.  Follow these steps when lifting:  Stand with your feet shoulder-width apart.  Get as close to the object as you can. Do not pick up heavy objects that are far from your body.  Use handles or lifting straps when possible.  Bend at your knees. Squat down, but keep your heels off the floor.  Keep your shoulders back, your chin tucked in, and your back straight.  Lift the object slowly. Tighten the muscles in your legs, stomach, and butt. Keep the object as close to the center of your body as possible.  Reverse these directions when you put a load down.  Do not:  Lift the object above your waist.  Twist at the waist while lifting or carrying a load. Move your feet if you need to turn, not your waist.  Bend over without bending at your knees.  Avoid reaching over your head, across a table, or for an object on a high surface. OTHER TIPS  Avoid wet floors and keep sidewalks clear of ice.  Do not sleep on a mattress that is too soft or too hard.  Keep items that you use often within easy reach.  Put heavier objects on shelves at waist level. Put lighter objects on lower or higher shelves.  Find ways to lessen your stress. You can try exercise, massage, or relaxation.  Get help for depression or  anxiety if needed. GET HELP IF:  You injure your back.  You have questions about diet, exercise, or other ways to prevent back injuries. MAKE SURE YOU:  Understand these instructions.  Will watch your condition.  Will get help right away if you are not doing well or get worse. Document Released: 12/05/2007 Document Revised: 09/10/2011 Document Reviewed: 07/30/2011 [facility] Patient Information 2015 [place], [place]. This information is not intended to replace advice given to you by your health care provider. Make sure you discuss any questions you have with your health care provider.

## 2014-11-30 NOTE — Care Management Note (Signed)
Case Management Note  Patient Details  Name: Tracy PringleJennifer Reading MRN: [id number] Date of Birth: 1956-03-13  Subjective/Objective:   Patient admitted with c/o back pain.                 Action/Plan:   Case manager spoke with patient concerning DME needs. She states she is under worker's comp. CM contacted Adjuster Oda KiltsMaisha Williams with [employer] @ [phone]. Faxed orders for RW and 3in1 to her at [phone]. Claim #[id number].   Expected Discharge Date:  11/30/14               Expected Discharge Plan:  Home/Self Care  In-House Referral:  NA  Discharge planning Services  CM Consult, NA  Post Acute Care Choice:  Durable Medical Equipment Choice offered to:  NA  DME Arranged:  3-N-1, Walker rolling DME Agency:  [facility]  HH Arranged:  NA HH Agency:     Status of Service:  Completed, signed off  Medicare Important Message Given:    Date Medicare IM Given:    Medicare IM give by:    Date Additional Medicare IM Given:    Additional Medicare Important Message give by:     If discussed at Long Length of Stay Meetings, dates discussed:    Additional Comments:  Per Ms. Mayford KnifeWilliams, it has not be determined if patient's complaint is related to worker's comp. They will be making s determination. For now, Case Manager instructed to obtain DME from our regular vendor. Case manager contacted Fayrene FearingJames, [facility] D[facility] Liaison.   Durenda GuthrieBrady, Yarel Rushlow Naomi, RN 11/30/2014, 1:54 PM

## 2014-11-30 NOTE — Progress Notes (Signed)
Family Medicine Teaching Service Daily Progress Note Intern Pager: [phone]  Patient name: Tracy Larson Medical record number: [id number] Date of birth: 01-29-1956 Age: 59 y.o. Gender: female  Primary Care Provider: No PCP Per Patient Consultants: None Code Status: Full  Pt Overview and Major Events to Date:  5/28 Admitted for severe back pain, incontinence worrisome for cauda equina 5/30: MRI Spine neg for impingement   Assessment and Plan: Tracy Larson is a 59 y.o. 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 History of adrenal hyperplasia, arthritis and obesity.   Back pain: Constellation of symptoms (with urinary and fecal incontinence); no saddle anesthesia or abnormal reflexes, and normal rectal tone. MRI shows only moderate canal stenosis at L4-5, mild at L3-4 and L5-S1.  Neural foraminal narrowing L2-3 through L5-S1: Moderate to severe at L4-5.  - Per discussion with Dr. Franky Macho of neurosurgery who reviewed the case including imaging, pt does not have an operative indication and does not need to follow up for this.  - Treat symptomatically with zanaflex ( and oxyIR - Physical therapy consulted, Outpatient PT - OT c/s, patient endorsing diff bathing 2/2 back pain  Urinary incontinence: More consistent with urge incontinence than overflow. UA unremarkable.  - Post void I/O cath bladder volume = 25 (unlikely due to cord compression)  Fecal incontinence: With normal rectal tone, this is not attributable to neurogenic etiology.  - Continue to monitor, consider C diff if diarrhea develops  Hypotension: 103/47 this am, baseline low pressures in outpatient notes. Mild tachycardia (90-100 bpm on admit) - resolved with IVF's.  No other signs of adrenal crisis (Electrolytes and glucose normal, no fever, no abdominal pain.)  - Continue telemetry  Congenital adrenal hyperplasia:  On lifelong glucocorticoid therapy. Has  had 2 adrenal crises (1985, 2005). AM cortisol at goal 11.9  - Continue current home hydrocortisone  bid (was lowered from  BID at Endocrinology OV 2/4) - No need for stress dose  FEN/GI: Saline lock since tolerating regular diet.  Prophylaxis: subQ heparin  Disposition: Discharge expected this afternoon after PT.  Subjective:  Continues to report mid thoracic and lumbar back pain. She was able to get out of bed this morning and shower.  She reports difficulty ambulating around to shower.  She states that pain medication is helping.  Has not had a BM yet.  Objective: Temp:  [98 F (36.7 C)] 98 F (36.7 C) (05/31 0615) Pulse Rate:  [59-67] 59 (05/31 0615) Resp:  [16-114] 114 (05/31 0615) BP: (88-103)/(47-55) 103/47 mmHg (05/31 0615) SpO2:  [97 %-100 %] 100 % (05/31 0615) Physical Exam: General: Well-appearing female, lying in bed; no distress.  Cardiovascular: RRR, no murmur, no LE edema Respiratory: Nonlabored, clear distant breath sounds.  Abdomen: obese, +BS, soft, NT/ ND Back: No tenderness to vertebral process palpation. Diffuse paraspinous muscles tender to very light touch in throacic back, particularly T8-12.  AROM in rotation in tact, flexion limited 2/2 pain. Neuro: Alert and oriented x4; Gross LE Motor and Sensation intact bilaterally  Psych: good eye contact, normal speech  Laboratory:  Recent Labs Lab 11/27/14 1732  WBC 9.4  HGB 13.8  HCT 42.5  PLT 291    Recent Labs Lab 11/27/14 1732  NA 137  K 3.6  CL 105  CO2 22  BUN 6  CREATININE 0.64  CALCIUM 8.6*  GLUCOSE 104*   Imaging/Diagnostic Tests: MRI THORACIC SPINE: Early degenerative change of the thoracic spine without acute fracture,  malalignment or neurocompressive changes. No suspicious enhancement.  Asymmetric RIGHT periscapular severe muscle atrophy (possibly infra spinatus versus latissimus dorsi).    MRI LUMBAR SPINE: Grade 1 L4-5 anterolisthesis on degenerative basis without  spondylolysis. No acute fracture. Probable L2-3 interspinous ligament strain.  Fibro lipomatosis changes of the filum terminale without cord tethering.  Moderate canal stenosis at L4-5, mild at L3-4 and L5-S1.  Neural foraminal narrowing L2-3 through L5-S1: Moderate to severe at L4-5.  L2-3 and L5-S1 annular fissures.  No suspicious enhancement.     Raliegh IpAshly M Gottschalk, DO 11/30/2014, 8:09 AM PGY-1, [facility] FPTS Intern pager: [phone], text pages welcome

## 2014-11-30 NOTE — Progress Notes (Signed)
Occupational Therapy Evaluation Patient Details Name: Tracy PringleJennifer Larson MRN: [id number] DOB: 1955-07-10 Today's Date: 11/30/2014    History of Present Illness Pt is a 59 y/o F presenting w/ severe back pain and fecal/urinary incontinence since 11/18/14. MRI shows only moderate canal stenosis at L4-5, mild at L3-4 and L5-S1. Neural foraminal narrowing L2-3 through L5-S1: Moderate to severe at L4-5.  Pt's PMH includes hyptension, and congenital adrenal hyperplasia.   Clinical Impression   Patient independent PTA. Patient currently functioning at an overall supervision>min assist level. Patient will benefit from acute OT to increase overall independence in the areas of ADLs, functional mobility, and overall safety in order to safely discharge home with 24/7. At this time recommending 24/7 secondary to increased pain and anxiety during all mobility tasks.      Follow Up Recommendations  No OT follow up;Supervision/Assistance - 24 hour (due to pain)    Equipment Recommendations  3 in 1 bedside comode    Recommendations for Other Services  None at this time   Precautions / Restrictions Precautions Precautions: Back;Fall Precaution Comments: Reviewed no bending, arching, lifting, twisting - handout given as pt with no recollection of discussing this yesterday with PT Restrictions Weight Bearing Restrictions: No      Mobility Bed Mobility Overal bed mobility: Needs Assistance Bed Mobility: Rolling;Sidelying to Sit Rolling: Supervision Sidelying to sit: Supervision General bed mobility comments: Cues for roll technique, safety, and for pursed lip breathing to help decrease pain/anxiety  Transfers Overall transfer level: Needs assistance Equipment used: Rolling walker (2 wheeled) Transfers: Sit to/from Stand Sit to Stand: Supervision General transfer comment: Cues for pushing from bed and not pulling on RW, safety, and for pursed lip breathing to help decrease pain/anxiety    Balance  Overall balance assessment: Needs assistance Sitting-balance support: Bilateral upper extremity supported;Feet supported Sitting balance-Leahy Scale: Fair     Standing balance support: Bilateral upper extremity supported;During functional activity Standing balance-Leahy Scale: Fair    ADL Overall ADL's : Needs assistance/impaired General ADL Comments: Pt limited by increased pain and anxiety. Introduced, educated, demonstrated, and had pt return demonstrate use of AE (reacher, sock aid, LH sponge, LH shoe horn). Attempted to have pt ambulate 2 doors down > gym for tub/shower transfer, but pt unable secondary to pain and yelling "I'm going to fall" when she got to the door out into the hallway. Also attempted to have pt ambulate throughout room for functional transfers, but due to increased pain pt only able to ambulate around bed for rest break on other side of bed then to recliner. Pt asked therapist about washing her hair and getting the "knots out". Pt with good ROM and strength throughout BUEs and able to externally rotate bilateral shoulders, reaching back of head. Discussed this with pt and educated pt on not arching back during this process. Do not feel pt is safe to discharge > home today due to pain and anxiety.     Pertinent Vitals/Pain Pain Assessment: 0-10 Pain Score: 7  Pain Location: back Pain Descriptors / Indicators: Aching Pain Intervention(s): Monitored during session;Repositioned;Limited activity within patient's tolerance     Hand Dominance Right   Extremity/Trunk Assessment Upper Extremity Assessment Upper Extremity Assessment: Overall WFL for tasks assessed   Lower Extremity Assessment Lower Extremity Assessment: Defer to PT evaluation   Cervical / Trunk Assessment Cervical / Trunk Assessment: Other exceptions Cervical / Trunk Exceptions: Hypersensitivity to light touch.  Pt twitches w/ light touch to lumbar paraspinals 2/2 pain.   Communication  Communication Communication: No difficulties   Cognition Arousal/Alertness: Awake/alert Behavior During Therapy: WFL for tasks assessed/performed Overall Cognitive Status: Within Functional Limits for tasks assessed              Home Living Family/patient expects to be discharged to:: Private residence Living Arrangements: Alone Available Help at Discharge: Family;Available PRN/intermittently (daughter lives by) Type of Home: House Home Access: Stairs to enter Secretary/administrator of Steps: 2 Entrance Stairs-Rails: None Home Layout: One level     Bathroom Shower/Tub: IT trainer: Standard     Home Equipment: None   Prior Functioning/Environment Level of Independence: Independent     OT Diagnosis: Generalized weakness;Acute pain   OT Problem List: Decreased strength;Decreased activity tolerance;Impaired balance (sitting and/or standing);Decreased safety awareness;Decreased knowledge of use of DME or AE;Decreased knowledge of precautions;Pain;Obesity   OT Treatment/Interventions: Self-care/ADL training;Therapeutic exercise;Energy conservation;DME and/or AE instruction;Therapeutic activities;Patient/family education;Balance training    OT Goals(Current goals can be found in the care plan section) Acute Rehab OT Goals Patient Stated Goal: decrease pain OT Goal Formulation: With patient Time For Goal Achievement: 12/07/14 Potential to Achieve Goals: Good ADL Goals Pt Will Perform Grooming: with modified independence;standing Pt Will Perform Upper Body Bathing: Independently;sitting Pt Will Perform Lower Body Bathing: with modified independence;sit to/from stand Pt Will Perform Upper Body Dressing: Independently;sitting Pt Will Perform Lower Body Dressing: with modified independence;sit to/from stand Pt Will Transfer to Toilet: with modified independence;ambulating;bedside commode Pt Will Perform Toileting - Clothing Manipulation and  hygiene: with modified independence;sit to/from stand;with adaptive equipment Pt Will Perform Tub/Shower Transfer: Tub transfer;ambulating;3 in 1;rolling walker;with supervision Additional ADL Goal #1: Pt will independently verbalize and adhere to 3/3 back precautions 100% of the time  OT Frequency: Min 2X/week   Barriers to D/C: Decreased caregiver support   End of Session Equipment Utilized During Treatment: Rolling walker  Activity Tolerance: Patient limited by pain (and anxiety) Patient left: in chair;with call bell/phone within reach;with nursing/sitter in room   Time: 0852-0924 OT Time Calculation (min): 32 min Charges:  OT General Charges $OT Visit: 1 Procedure OT Evaluation $Initial OT Evaluation Tier I: 1 Procedure OT Treatments $Self Care/Home Management : 8-22 mins G-Codes: OT G-codes **NOT FOR INPATIENT CLASS** Functional Limitation: Self care Self Care Current Status (Z6109): At least 1 percent but less than 20 percent impaired, limited or restricted Self Care Goal Status (U0454): At least 1 percent but less than 20 percent impaired, limited or restricted  Jaunita Mikels , MS, OTR/L, CLT Pager: [phone]  11/30/2014, 9:46 AM

## 2014-12-07 ENCOUNTER — Telehealth: Payer: Self-pay | Admitting: Family Medicine

## 2014-12-07 ENCOUNTER — Ambulatory Visit (INDEPENDENT_AMBULATORY_CARE_PROVIDER_SITE_OTHER): Admitting: Family Medicine

## 2014-12-07 VITALS — BP 110/74 | HR 90 | Temp 98.7°F | Resp 17 | Ht 61.0 in | Wt 241.0 lb

## 2014-12-07 DIAGNOSIS — M549 Dorsalgia, unspecified: Secondary | ICD-10-CM

## 2014-12-07 DIAGNOSIS — M545 Low back pain: Secondary | ICD-10-CM | POA: Diagnosis not present

## 2014-12-07 DIAGNOSIS — M546 Pain in thoracic spine: Secondary | ICD-10-CM | POA: Diagnosis not present

## 2014-12-07 MED ORDER — TIZANIDINE HCL 4 MG PO TABS: 4.0000 mg | ORAL_TABLET | Freq: Three times a day (TID) | ORAL | Status: DC | PRN

## 2014-12-07 MED ORDER — IBUPROFEN 800 MG PO TABS: 800.0000 mg | ORAL_TABLET | Freq: Three times a day (TID) | ORAL | Status: DC

## 2014-12-07 MED ORDER — TRAMADOL HCL 50 MG PO TABS: 50.0000 mg | ORAL_TABLET | Freq: Four times a day (QID) | ORAL | Status: DC | PRN

## 2014-12-07 MED ORDER — OXYCODONE HCL 5 MG PO TABS: 5.0000 mg | ORAL_TABLET | ORAL | Status: DC | PRN

## 2014-12-07 NOTE — Telephone Encounter (Signed)
Noted after patient that dizziness was listed as complaint on her chief complaint today that was not discussed during visit. Attempted call for more info - no answer. Left message that I will try to reach her again to discuss this further.

## 2014-12-07 NOTE — Telephone Encounter (Signed)
Calling to obtain more info about "dizziness" that was listed on chief complaint, but we did not discuss when she was in office for follow up of her back pain. Left message for her to call and provide more information about this symptom. When did dizziness start? Oxycodone and other meds she is taking for back can cause dizziness, so should be very cautious and use walker if that is the case, but more info needed as not discussed in office.

## 2014-12-07 NOTE — Patient Instructions (Addendum)
Follow up with your primary care provider about the incontinence as it was thought this was form urge incontinence, not related to your injury.   For your back - I will refer you to orthopaedics and physical therapy for continued management and evaluation. I refilled your medication for now, but will need to follow up with Dr. Conley RollsLe next Tuesday the 14th after 4pm for recheck and medication refills if you have not seen orthopaedics at that time.   Do not exceed the frequency of dosing as indicated on your medicines, and take either tramadol or if more severe pain - oxycodone. You should not need to take both at the same time.   Return to the clinic or go to the nearest emergency room if any of your symptoms worsen or new symptoms occur.

## 2014-12-07 NOTE — Progress Notes (Addendum)
Subjective:  This chart was scribed for Tracy Staggers, MD by [facility], medical scribe at [facility].The patient was seen in exam room 08 and the patient's care was started at 1:23 PM.   Patient ID: Tracy Larson, female    DOB: 09/15/1955, 59 y.o.   MRN: [id number] Chief Complaint  Patient presents with  . Dizziness  . Follow-up    from hospital     HPI  HPI Comments: Tracy Larson is a 59 y.o. female who presents to [facility] here for a follow up for an injury at work. The date of injury was May 8 th, initially evaluation May 9 th by Dr. 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 See notes from last visit regarding treatment plan on subsequent visits. I evaluated her on May 28 th and that time was complaining both urinary and stool incontinence with her back pain. Sent to the ED for evaluation. She was admitted from May 28 th through May 31 st. She had an MRI indicating moderate canal stenosis L4-5, L5-S1 and neural foraminal narrowing at L2 to S1, moderate to severe at L4-L5. discussed with neurosurgeon who did not believe surgery was necessary. Recommended treatment with muscle relaxer and pain medication. Physical therapy was consulted, she was provided a rolling walker and bedside commode. Urinary incontinence with thought urge incontinence, she had a PVR that was low so symptoms were not believed to be due to cord compression and normal rectal tone in the hospital. She was discharged on oxycodone 5 mg (18) 1-2 as needed. Zanaflex (30) 4 mg every 8 hours as needed, ibuprofen (30)  800 mg three times daily and tramadol (12) 50 mg every 6 hours as needed for moderate pain.   She is not having outpatient physical therapy, but has had physical therapy at the hospital. Pt states she has not been contacted for outpatient physical therapy. She is currently taking one oxycodone every three hours, Tramadol  twice a day one in the morning and one at night daily. She takes the Zanaflex twice a day once in the morning and once in the morning. No intolerance from the medications she is taking. Pain is still the same but is more managed, she is more mobile. No longer having urinary incontinence.  No Known Allergies   Prior to Admission medications   Medication Sig Start Date End Date Taking? Authorizing Provider  diazepam (VALIUM) 5 MG tablet Take 5 mg by mouth every 6 (six) hours as needed for anxiety.   Yes Historical Provider, MD  Ginsengs-Royal Jelly (GINSENG COMPLEX/ROYAL JELLY PO) Take 800 mg by mouth 2 (two) times daily.   Yes Historical Provider, MD  ibuprofen (ADVIL,MOTRIN) 800 MG tablet Take 1 tablet (800 mg total) by mouth 3 (three) times daily. 11/30/14  Yes Ashly M Gottschalk, DO  oxyCODONE (OXY IR/ROXICODONE) 5 MG immediate release tablet Take 1-2 tablets (5-10 mg total) by mouth every 4 (four) hours as needed for breakthrough pain. 11/30/14  Yes Ashly M Gottschalk, DO  tiZANidine (ZANAFLEX) 4 MG tablet Take 1 tablet (4 mg total) by mouth every 8 (eight) hours as needed for muscle spasms. 11/30/14  Yes Ashly Hulen Skains, DO  traMADol (ULTRAM) 50 MG tablet Take 1 tablet (50 mg total) by mouth every 6 (six) hours as needed for moderate pain. 11/30/14  Yes Ashly Hulen Skains, DO  vitamin B-12 (CYANOCOBALAMIN) 500 MCG tablet Take 500  mcg by mouth daily.   Yes Historical Provider, MD  hydrocortisone (CORTEF) 10 MG tablet Take 10 mg by mouth daily.    Historical Provider, MD   Review of Systems  Musculoskeletal: Positive for back pain and gait problem.       Objective:  BP 110/74 mmHg  Pulse 90  Temp(Src) 98.7 F (37.1 C) (Oral)  Resp 17  Ht 5\' 1"  (1.549 m)  Wt 241 lb (109.317 kg)  BMI 45.56 kg/m2  SpO2 97% Physical Exam  Constitutional: She is oriented to person, place, and time. She appears well-developed and well-nourished. No distress.  HENT:  Head: Normocephalic and atraumatic.    Eyes: Pupils are equal, round, and reactive to light.  Neck: Normal range of motion.  Cardiovascular: Normal rate and regular rhythm.   Pulmonary/Chest: Effort normal. No respiratory distress.  Musculoskeletal: Normal range of motion.  Guarded Thoracic and lumbar with pain from light touch throughout out perispinal and midline. From upper thoracic. Also guarded with light touch through the lower lumbar spine to just about the buttock midline. Able to walk equal right and left with use of walker but declined heel or toe walking. Declined flexion or extension of the lumbar spine but limited lateral flexion. Negative straight leg raise seated bilaterally.  Neurological: She is alert and oriented to person, place, and time.  Reflex Scores:      Patellar reflexes are 2+ on the right side and 2+ on the left side.      Achilles reflexes are 2+ on the right side and 2+ on the left side. Skin: Skin is warm and dry.  Psychiatric: She has a normal mood and affect. Her behavior is normal.  Nursing note and vitals reviewed.       Assessment & Plan:   Tracy PringleJennifer Sturgess is a 59 y.o. female Pain, upper back - Plan: traMADol (ULTRAM) 50 MG tablet, tiZANidine (ZANAFLEX) 4 MG tablet, oxyCODONE (OXY IR/ROXICODONE) 5 MG immediate release tablet, ibuprofen (ADVIL,MOTRIN) 800 MG tablet, Ambulatory referral to Physical Therapy, Ambulatory referral to Orthopedic Surgery  Low back pain without sciatica, unspecified back pain laterality - Plan: traMADol (ULTRAM) 50 MG tablet, tiZANidine (ZANAFLEX) 4 MG tablet, oxyCODONE (OXY IR/ROXICODONE) 5 MG immediate release tablet, ibuprofen (ADVIL,MOTRIN) 800 MG tablet, Ambulatory referral to Physical Therapy, Ambulatory referral to Orthopedic Surgery  Low back pain from injury at work. S/p hospitalization, with MRI abnormalities noted, but no surgery required. Also determined that incontinence was not related to back injury. S/p PT in hospital but has not been started  outpatient. Symptomatically improved with reported more mobility (using rolling walker, and has bedside commode if needed), but persistent pain. Still some difficulty on exam with guarding and diffuse back pain with light touch that limited some of my exam today. LE reflexes intact and able to walk with RW.   -continue same pain medication regimen for now, but discussed plan to wean to less use as pain improves, not to use oxycodone more than every 4 hours. Advised to try ultram 1st if not severe pain, OR for more severe pain - oxycodone. SED.  -continue ibuprofen for inflammation/pain, and zanaflex if needed for spasm.   -referred to outpatient PT, but also to orthopaedics due to persistent pain and difficulty with exam, and abnormalities noted on MRI to determine next step besides PT if needed.   -plan on follow up with Dr. Conley RollsLe in 1 week if not seen by ortho yet. Sooner or to ER if worse.   -out  of work until follow up. Understanding of plan expressed.     Meds ordered this encounter  Medications  . diazepam (VALIUM) 5 MG tablet    Sig: Take 5 mg by mouth every 6 (six) hours as needed for anxiety.  . traMADol (ULTRAM) 50 MG tablet    Sig: Take 1 tablet (50 mg total) by mouth every 6 (six) hours as needed for moderate pain.    Dispense:  15 tablet    Refill:  0  . tiZANidine (ZANAFLEX) 4 MG tablet    Sig: Take 1 tablet (4 mg total) by mouth every 8 (eight) hours as needed for muscle spasms.    Dispense:  30 tablet    Refill:  0  . oxyCODONE (OXY IR/ROXICODONE) 5 MG immediate release tablet    Sig: Take 1-2 tablets (5-10 mg total) by mouth every 4 (four) hours as needed for breakthrough pain.    Dispense:  42 tablet    Refill:  0  . ibuprofen (ADVIL,MOTRIN) 800 MG tablet    Sig: Take 1 tablet (800 mg total) by mouth 3 (three) times daily.    Dispense:  30 tablet    Refill:  0   Patient Instructions  Follow up with your primary care provider about the incontinence as it was thought this  was form urge incontinence, not related to your injury.   For your back - I will refer you to orthopaedics and physical therapy for continued management and evaluation. I refilled your medication for now, but will need to follow up with Dr. Conley Rolls next Tuesday the 14th after 4pm for recheck and medication refills if you have not seen orthopaedics at that time.   Do not exceed the frequency of dosing as indicated on your medicines, and take either tramadol or if more severe pain - oxycodone. You should not need to take both at the same time.   Return to the clinic or go to the nearest emergency room if any of your symptoms worsen or new symptoms occur.      I personally performed the services described in this documentation, which was scribed in my presence. The recorded information has been reviewed and considered, and addended by me as needed.

## 2014-12-09 NOTE — Telephone Encounter (Signed)
Called pt, she states the dizziness started on the day she came into the urgent care. She states she feels better and she is fine. She sees improvement everyday. I advised her to come into the clinic if getting worse and gave message from Dr. Neva Seat. Pt agreed and verbalized understanding.

## 2014-12-14 ENCOUNTER — Ambulatory Visit (INDEPENDENT_AMBULATORY_CARE_PROVIDER_SITE_OTHER): Admitting: Family Medicine

## 2014-12-14 VITALS — BP 110/84 | HR 99 | Temp 98.8°F | Resp 17 | Ht 61.0 in | Wt 250.0 lb

## 2014-12-14 DIAGNOSIS — M546 Pain in thoracic spine: Secondary | ICD-10-CM

## 2014-12-14 DIAGNOSIS — Z7952 Long term (current) use of systemic steroids: Secondary | ICD-10-CM

## 2014-12-14 DIAGNOSIS — M545 Low back pain: Secondary | ICD-10-CM

## 2014-12-14 DIAGNOSIS — M549 Dorsalgia, unspecified: Secondary | ICD-10-CM

## 2014-12-14 MED ORDER — TIZANIDINE HCL 4 MG PO TABS: 4.0000 mg | ORAL_TABLET | Freq: Three times a day (TID) | ORAL | Status: DC | PRN

## 2014-12-14 MED ORDER — OXYCODONE HCL 5 MG PO TABS: 5.0000 mg | ORAL_TABLET | ORAL | Status: DC | PRN

## 2014-12-14 MED ORDER — IBUPROFEN 800 MG PO TABS: 800.0000 mg | ORAL_TABLET | Freq: Three times a day (TID) | ORAL | Status: DC

## 2014-12-14 NOTE — Progress Notes (Signed)
Chief Complaint:  Chief Complaint  Patient presents with  . Follow-up    back injury     HPI: Tracy Larson is a 59 y.o. female who is here for back injury recheck, initial date of injury was 11/07/14. She would like to go back to light duty for 3.5 hours per day since she is feeling slightly better and is dipping into her savings.  She is doing better, she still has pain 6/10, has not seen ortho and has not seen PT She is able to sit but not stand or walk or bend over or lift things. She is still on meds but wants to try to wean off her Tramadol and valium. She still has them but would like to taper them off so would not like a refill.  Her incontinence is the same , no new changes.    Last OV visit with Dr Neva Seat: Tracy Larson is a 59 y.o. female who presents to [facility] here for a follow up for an injury at work. The date of injury was May 8 th, initially evaluation May 9 th by Dr. 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 See notes from last visit regarding treatment plan on subsequent visits. I evaluated her on May 28 th and that time was complaining both urinary and stool incontinence with her back pain. Sent to the ED for evaluation. She was admitted from May 28 th through May 31 st. She had an MRI indicating moderate canal stenosis L4-5, L5-S1 and neural foraminal narrowing at L2 to S1, moderate to severe at L4-L5. discussed with neurosurgeon who did not believe surgery was necessary. Recommended treatment with muscle relaxer and pain medication. Physical therapy was consulted, she was provided a rolling walker and bedside commode. Urinary incontinence with thought urge incontinence, she had a PVR that was low so symptoms were not believed to be due to cord compression and normal rectal tone in the hospital. She was discharged on oxycodone 5 mg (18) 1-2 as needed. Zanaflex (30) 4 mg every 8 hours as needed,  ibuprofen (30) 800 mg three times daily and tramadol (12) 50 mg every 6 hours as needed for moderate pain.   She is not having outpatient physical therapy, but has had physical therapy at the hospital. Pt states she has not been contacted for outpatient physical therapy. She is currently taking one oxycodone every three hours, Tramadol twice a day one in the morning and one at night daily. She takes the Zanaflex twice a day once in the morning and once in the morning. No intolerance from the medications she is taking. Pain is still the same but is more managed, she is more mobile. No longer having urinary incontinence.   Past Medical History  Diagnosis Date  . Adrenal hyperplasia    Past Surgical History  Procedure Laterality Date  . Cesarean section     History   Social History  . Marital Status: Single    Spouse Name: [name]  . Number of Children: [name]  . Years of Education: [name]   Social History Main Topics  . Smoking status: Never Smoker   . Smokeless tobacco: Not on file  . Alcohol Use: No  . Drug Use: No  . Sexual Activity: Not on file   Other Topics Concern  . Not on file   Social History Narrative   No family history on file. No Known  Allergies Prior to Admission medications   Medication Sig Start Date End Date Taking? Authorizing Provider  diazepam (VALIUM) 5 MG tablet Take 5 mg by mouth every 6 (six) hours as needed for anxiety.   Yes Historical Provider, MD  Ginsengs-Royal Jelly (GINSENG COMPLEX/ROYAL JELLY PO) Take 800 mg by mouth 2 (two) times daily.   Yes Historical Provider, MD  hydrocortisone (CORTEF) 10 MG tablet Take 10 mg by mouth daily.   Yes Historical Provider, MD  ibuprofen (ADVIL,MOTRIN) 800 MG tablet Take 1 tablet (800 mg total) by mouth 3 (three) times daily. 12/07/14  Yes Shade Flood, MD  oxyCODONE (OXY IR/ROXICODONE) 5 MG immediate release tablet Take 1-2 tablets (5-10 mg total) by mouth every 4 (four) hours as needed for breakthrough pain. 12/07/14   Yes Shade Flood, MD  tiZANidine (ZANAFLEX) 4 MG tablet Take 1 tablet (4 mg total) by mouth every 8 (eight) hours as needed for muscle spasms. 12/07/14  Yes Shade Flood, MD  traMADol (ULTRAM) 50 MG tablet Take 1 tablet (50 mg total) by mouth every 6 (six) hours as needed for moderate pain. 12/07/14  Yes Shade Flood, MD  vitamin B-12 (CYANOCOBALAMIN) 500 MCG tablet Take 500 mcg by mouth daily.   Yes Historical Provider, MD     ROS: The patient denies fevers, chills, night sweats, unintentional weight loss, chest pain, palpitations, wheezing, dyspnea on exertion, nausea, vomiting, abdominal pain, dysuria, hematuria, melena, numbness, weakness, or tingling.   All other systems have been reviewed and were otherwise negative with the exception of those mentioned in the HPI and as above.    PHYSICAL EXAM: Filed Vitals:   12/14/14 1756  BP: 110/84  Pulse: 99  Temp: 98.8 F (37.1 C)  Resp: 17   Filed Vitals:   12/14/14 1756  Height:  (1.549 m)  Weight: 250 lb (113.399 kg)   Body mass index is 47.26 kg/(m^2).   General: Alert, no acute distress if sitting, morbidly obese female HEENT:  Normocephalic, atraumatic, oropharynx patent. EOMI, PERRLA Cardiovascular:  Regular rate and rhythm, no rubs murmurs or gallops.  No Carotid bruits, radial pulse intact. No pedal edema.  Respiratory: Clear to auscultation bilaterally.  No wheezes, rales, or rhonchi.  No cyanosis, no use of accessory musculature GI: No organomegaly, abdomen is soft and non-tender, positive bowel sounds.  No masses. Skin: No rashes. Neurologic: Facial musculature symmetric. Psychiatric: Patient is appropriate throughout our interaction. Lymphatic: No cervical lymphadenopathy Musculoskeletal: Gait antalgic. Using cane Neg for saddle anesthesia She still has significant tenderness with palpation of throacic and lumbar spine. She has poor ROM 5/5 strength in platar and dorsiflexion 2/2 knee and ankle  DTRs  LABS: Results for orders placed or performed during the hospital encounter of 11/27/14  CBC with Differential/Platelet  Result Value Ref Range   WBC 9.4 4.0 - 10.5 K/uL   RBC 4.98 3.87 - 5.11 MIL/uL   Hemoglobin 13.8 12.0 - 15.0 g/dL   HCT 40.9 81.1 - 91.4 %   MCV 85.3 78.0 - 100.0 fL   MCH 27.7 26.0 - 34.0 pg   MCHC 32.5 30.0 - 36.0 g/dL   RDW 78.2 95.6 - 21.3 %   Platelets 291 150 - 400 K/uL   Neutrophils Relative % 66 43 - 77 %   Neutro Abs 6.3 1.7 - 7.7 K/uL   Lymphocytes Relative 27 12 - 46 %   Lymphs Abs 2.5 0.7 - 4.0 K/uL   Monocytes Relative 5 3 - 12 %  Monocytes Absolute 0.5 0.1 - 1.0 K/uL   Eosinophils Relative 2 0 - 5 %   Eosinophils Absolute 0.1 0.0 - 0.7 K/uL   Basophils Relative 0 0 - 1 %   Basophils Absolute 0.0 0.0 - 0.1 K/uL  Basic metabolic panel  Result Value Ref Range   Sodium 137 135 - 145 mmol/L   Potassium 3.6 3.5 - 5.1 mmol/L   Chloride 105 101 - 111 mmol/L   CO2 22 22 - 32 mmol/L   Glucose, Bld 104 (H) 65 - 99 mg/dL   BUN 6 6 - 20 mg/dL   Creatinine, Ser 1.42 0.44 - 1.00 mg/dL   Calcium 8.6 (L) 8.9 - 10.3 mg/dL   GFR calc non Af Amer >60 >60 mL/min   GFR calc Af Amer >60 >60 mL/min   Anion gap 10 5 - 15  Sedimentation rate  Result Value Ref Range   Sed Rate 24 (H) 0 - 22 mm/hr  Urinalysis, Routine w reflex microscopic (not at [facility])  Result Value Ref Range   Color, Urine YELLOW YELLOW   APPearance CLEAR CLEAR   Specific Gravity, Urine 1.009 1.005 - 1.030   pH 5.0 5.0 - 8.0   Glucose, UA NEGATIVE NEGATIVE mg/dL   Hgb urine dipstick TRACE (A) NEGATIVE   Bilirubin Urine NEGATIVE NEGATIVE   Ketones, ur NEGATIVE NEGATIVE mg/dL   Protein, ur NEGATIVE NEGATIVE mg/dL   Urobilinogen, UA 0.2 0.0 - 1.0 mg/dL   Nitrite NEGATIVE NEGATIVE   Leukocytes, UA NEGATIVE NEGATIVE  Urine microscopic-add on  Result Value Ref Range   Squamous Epithelial / LPF RARE RARE   WBC, UA 0-2 <3 WBC/hpf   RBC / HPF 0-2 <3 RBC/hpf   Bacteria, UA RARE RARE   Urine rapid drug screen (hosp performed)  Result Value Ref Range   Opiates POSITIVE (A) NONE DETECTED   Cocaine NONE DETECTED NONE DETECTED   Benzodiazepines NONE DETECTED NONE DETECTED   Amphetamines NONE DETECTED NONE DETECTED   Tetrahydrocannabinol NONE DETECTED NONE DETECTED   Barbiturates NONE DETECTED NONE DETECTED  Cortisol-am, blood  Result Value Ref Range   Cortisol - AM 11.9 6.7 - 22.6 ug/dL     EKG/XRAY:   Primary read interpreted by Dr. Conley Rolls at [facility].   ASSESSMENT/PLAN: Encounter Diagnoses  Name Primary?  . Low back pain without sciatica, unspecified back pain laterality Yes  . Pain, upper back   . 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 She is doing better, still has 6/10 pain Would like to start weaning off tramadol and vlaium She needs refuill son her other meds She does not have drowsiness or SEs with the other meds She is using them as instructed since pain has improved.  She has not see ortho or gone to outpatient PT , per patient no one has contacted her so I will check on that.  She can return to work on light duty for 3.5 hours 5 days per week.  Fu in 1 week  Gross sideeffects, risk and benefits, and alternatives of medications d/w patient. Patient is aware that all medications have potential sideeffects and we are unable to predict every sideeffect or drug-drug interaction that may occur.  Hamilton Capri PHUONG, DO 12/15/2014 12:57 PM

## 2015-01-26 ENCOUNTER — Ambulatory Visit (INDEPENDENT_AMBULATORY_CARE_PROVIDER_SITE_OTHER): Admitting: Family Medicine

## 2015-01-26 ENCOUNTER — Encounter (HOSPITAL_COMMUNITY): Payer: Self-pay | Admitting: Vascular Surgery

## 2015-01-26 ENCOUNTER — Emergency Department (HOSPITAL_COMMUNITY)
Admission: EM | Admit: 2015-01-26 | Discharge: 2015-01-26 | Disposition: A | Discharge status: Home / Self Care | Attending: Emergency Medicine | Admitting: Emergency Medicine

## 2015-01-26 VITALS — BP 115/81 | HR 111 | Temp 98.2°F | Resp 18 | Ht 60.5 in | Wt 252.6 lb

## 2015-01-26 DIAGNOSIS — N3941 Urge incontinence: Secondary | ICD-10-CM | POA: Diagnosis not present

## 2015-01-26 DIAGNOSIS — M545 Low back pain: Secondary | ICD-10-CM

## 2015-01-26 DIAGNOSIS — M5442 Lumbago with sciatica, left side: Secondary | ICD-10-CM

## 2015-01-26 DIAGNOSIS — E259 Adrenogenital disorder, unspecified: Secondary | ICD-10-CM | POA: Diagnosis not present

## 2015-01-26 DIAGNOSIS — Z8639 Personal history of other endocrine, nutritional and metabolic disease: Secondary | ICD-10-CM | POA: Insufficient documentation

## 2015-01-26 DIAGNOSIS — Z79899 Other long term (current) drug therapy: Secondary | ICD-10-CM | POA: Diagnosis not present

## 2015-01-26 DIAGNOSIS — M546 Pain in thoracic spine: Secondary | ICD-10-CM | POA: Diagnosis not present

## 2015-01-26 DIAGNOSIS — Z7952 Long term (current) use of systemic steroids: Secondary | ICD-10-CM | POA: Insufficient documentation

## 2015-01-26 DIAGNOSIS — G8929 Other chronic pain: Secondary | ICD-10-CM | POA: Insufficient documentation

## 2015-01-26 DIAGNOSIS — E250 Congenital adrenogenital disorders associated with enzyme deficiency: Secondary | ICD-10-CM

## 2015-01-26 DIAGNOSIS — M549 Dorsalgia, unspecified: Secondary | ICD-10-CM

## 2015-01-26 DIAGNOSIS — Z760 Encounter for issue of repeat prescription: Secondary | ICD-10-CM | POA: Diagnosis not present

## 2015-01-26 MED ORDER — IBUPROFEN 800 MG PO TABS: 800.0000 mg | ORAL_TABLET | Freq: Three times a day (TID) | ORAL | Status: DC

## 2015-01-26 MED ORDER — IBUPROFEN 400 MG PO TABS
800.0000 mg | ORAL_TABLET | Freq: Once | ORAL | Status: AC
  Administered 2015-01-26: 600 mg via ORAL
  Filled 2015-01-26: qty 4

## 2015-01-26 MED ORDER — METHOCARBAMOL 500 MG PO TABS: 1000.0000 mg | ORAL_TABLET | Freq: Four times a day (QID) | ORAL | Status: DC | PRN

## 2015-01-26 MED ORDER — TRAMADOL HCL 50 MG PO TABS: 50.0000 mg | ORAL_TABLET | Freq: Four times a day (QID) | ORAL | Status: DC | PRN

## 2015-01-26 MED ORDER — OXYCODONE HCL 5 MG PO TABS
5.0000 mg | ORAL_TABLET | Freq: Once | ORAL | Status: AC
  Administered 2015-01-26: 5 mg via ORAL
  Filled 2015-01-26: qty 1

## 2015-01-26 NOTE — Progress Notes (Signed)
Subjective:  This chart was scribed for Tracy Staggers, MD by Andrew Au, ED Scribe. This patient was seen in room 3 and the patient's care was started at 7:30 PM   Patient ID: Tracy Larson, female    DOB: [birth year], 59 y.o.   MRN: [id number]  HPI Chief Complaint  Patient presents with  . Medication Refill   HPI Comments: Tracy Larson is a 59 y.o. female who presents to the [facility] for medication refill. Here for f/u. Hx of injury at work. Initial injury 11/07/14, had been hospitalized for pain and subsequently f/u with Dr. Conley Rolls and myself. 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 Last visit with Dr. Conley Rolls was June 14 pain 6/10; had not seen PT. Was able to sit but was having some problems with standing, walking, bending and lifting thing. No changes in incontinence. Per that visit wanted to wean of tramadol and valium. Was returned to work light duty 3.5 hours 5 days weekly. She had been referred to ortho and PT but had not seen them at that time. On review of referral on 6/29 letter was received stating claim had been denied for WC. Also noted on PT referral denied for this reason. She was seen today in ED for her back pain with left leg radiation. Per that note she had been taking ibuprofen, tramadol and oxycodone. She had been out of medication for 2 days and was there for ortho referral and refill. Of note when hospitalized 5/25 for back pain and incontinence MRI was performed with foraminal narrowing including moderate to severe L4-5 discussed with nuero and did not recommend surgery plan for PT outpatient, pain medication, and muscle relaxer. Her urinary incontinence was thought to be GERD incontinence and not  thought to be due to spinal cord compression. She was prescribed robaxin 500 mg 2 tablets qid and ibuprofen 800 mg tid at ED visit today.   Pt is wanting a refill of oxycodone and tramadol.  She was initially supposed to follow up with Dr. Conley Rolls  but states she went to the ED today because it was closer and she needed refills of her medication. She has unchanged, low back pain that radiates to left hip and down left leg along with unchanged upper back pain. She had told Dr. Conley Rolls, during her last visit, she was having improving, 6/10, back pain but states she had said pain was improving to go back to work . She ambulates with a walker due to left leg giving out on her. Her last dose of her medication was 4 days ago. She has been weaning off of tramadol which she has been taking every 2-3 days and as needed if pain worsens. She has been taking oxycodone and tramadol together, 1 of each, which she states works best with her. She takes valium only as needed, about twice a week. She also takes hydrocortisone 40 mg, 20 mg in the morning and another dose at 1 in the afternoon, prescribed in May by Dr.Gherghe her endocrinologist.  She has unchanged urinary incontinence, onset since injury, of not being able to make to the bathroom on time. She has a portable toilet at home. She has not been seen be Urology. She is wanting a referral to ortho and to have bone density scan. She denies cardiac problems and hx of MI.   Pt has not been back to work. She states she is unable to stand for long periods of  time but is able to sit up straight and use her arms. She is willing to go back to work on light duty.   Patient Active Problem List   Diagnosis Date Noted  . Notalgia   . Bilateral thoracic back pain   . Absence of bladder continence   . Fecal incontinence   . CAH (congenital adrenal hyperplasia)   . Back pain 11/27/2014  . Morbid obesity 11/08/2014  . Arthritis 06/04/2014  . Congenital adrenal hyperplasia 02/21/2014   Past Medical History  Diagnosis Date  . Adrenal hyperplasia    Past Surgical History  Procedure Laterality Date  . Cesarean section     No Known Allergies Prior to Admission medications   Medication Sig Start Date End Date Taking?  Authorizing Provider  diazepam (VALIUM) 5 MG tablet Take 5 mg by mouth every 6 (six) hours as needed for anxiety.   Yes Historical Provider, MD  Ginsengs-Royal Jelly (GINSENG COMPLEX/ROYAL JELLY PO) Take 800 mg by mouth 2 (two) times daily.   Yes Historical Provider, MD  hydrocortisone (CORTEF) 10 MG tablet Take 10 mg by mouth daily.   Yes Historical Provider, MD  ibuprofen (ADVIL,MOTRIN) 800 MG tablet Take 1 tablet (800 mg total) by mouth 3 (three) times daily. 01/26/15  Yes Nicole Pisciotta, PA-C  methocarbamol (ROBAXIN) 500 MG tablet Take 2 tablets (1,000 mg total) by mouth 4 (four) times daily as needed (Pain). 01/26/15  Yes Nicole Pisciotta, PA-C  oxyCODONE (OXY IR/ROXICODONE) 5 MG immediate release tablet Take 1-2 tablets (5-10 mg total) by mouth every 4 (four) hours as needed for breakthrough pain. Monitor for constipation 12/14/14  Yes Thao P Le, DO  tiZANidine (ZANAFLEX) 4 MG tablet Take 1 tablet (4 mg total) by mouth every 8 (eight) hours as needed for muscle spasms. 12/14/14  Yes Thao P Le, DO  traMADol (ULTRAM) 50 MG tablet Take 1 tablet (50 mg total) by mouth every 6 (six) hours as needed for moderate pain. 12/07/14  Yes Shade Flood, MD  vitamin B-12 (CYANOCOBALAMIN) 500 MCG tablet Take 500 mcg by mouth daily.   Yes Historical Provider, MD   History   Social History  . Marital Status: Single    Spouse Name: [name]  . Number of Children: [name]  . Years of Education: [name]   Occupational History  . Not on file.   Social History Main Topics  . Smoking status: Never Smoker   . Smokeless tobacco: Not on file  . Alcohol Use: No  . Drug Use: No  . Sexual Activity: Not on file   Other Topics Concern  . Not on file   Social History Narrative   Review of Systems  Genitourinary: Positive for enuresis.  Musculoskeletal: Positive for myalgias, back pain and gait problem.  Neurological: Positive for weakness. Negative for numbness.   Objective:  Physical Exam  Constitutional: She is  oriented to person, place, and time. She appears well-developed and well-nourished. No distress.  HENT:  Head: Normocephalic and atraumatic.  Eyes: Conjunctivae and EOM are normal.  Neck: Neck supple.  Cardiovascular: Normal rate.   Pulmonary/Chest: Effort normal.  Musculoskeletal: Normal range of motion.  difficult exam, retracts to light touch. Tender diffusely from mid thoracic spine to lumabr  Neurological: She is alert and oriented to person, place, and time.  Reflex Scores:      Patellar reflexes are 2+ on the right side and 2+ on the left side.      Achilles reflexes are 2+ on the  right side and 2+ on the left side. Jumps with reflex testing due to pain radiating to spine. Also guarded and retracts when checking achilles reflex. She reports pain radiates from achilles to low back Right reflexes without retracting.   Skin: Skin is warm and dry.  Psychiatric: She has a normal mood and affect. Her behavior is normal.  Nursing note and vitals reviewed.  Filed Vitals:   01/26/15 1856  BP: 115/81  Pulse: 111  Temp: 98.2 F (36.8 C)  TempSrc: Oral  Resp: 18  Height: 5' 0.5" (1.537 m)  Weight: 252 lb 9.6 oz (114.579 kg)  SpO2: 97%    Assessment & Plan:   Tracy Larson is a 59 y.o. female Left-sided low back pain with left-sided sciatica, Pain, upper back , Low back pain without sciatica, unspecified back pain laterality - Plan: traMADol (ULTRAM) 50 MG tablet, Ambulatory referral to Orthopedic Surgery, Ambulatory referral to Physical Therapy  - still with difficult exam as guarded with light touch diffusely in back. Differential diagnosis includes complex regional pain syndrome/RSD, but has had this guarding since early on in illness. Additionally unsure of reason for radiation of pain up leg to back with reflex testing.   -has been able to wean back on frequency of pain meds - discussed not combining oxycodone and ultram. Will try change to just Ultram if needed for breakthrough  pain.   -trial of Robaxin as prescribed by emergency room in place of Valium  -refer to orthopaedics for eval and physical therapy referral also placed.   -rtc precautions if worsening.   - if meds running out prior to Ortho eval - follow up with Dr. Conley Rolls or myself.   Long term current use of systemic steroids, CAH (congenital adrenal hyperplasia)  - hold on NSAID given hydrocortisone use. Continue follow up with endocrinologist.   Urge incontinence - Plan: Ambulatory referral to Urology  -based on hospital with low PVR, so unlikely cord compression cause.   -refer to urology for eval. Handout given on AVS.   Meds ordered this encounter  Medications  . traMADol (ULTRAM) 50 MG tablet    Sig: Take 1 tablet (50 mg total) by mouth every 6 (six) hours as needed for moderate pain.    Dispense:  20 tablet    Refill:  0   Patient Instructions  Based on your use of hydrocortisone each day, we can hold off on using the ibuprofen that was prescribed by the emergency room (as use of both of these medicines may increase risk of stomach ulcer).   As you're able to wean use of the Valium and the other pain medications to every few days, I agree with changing your muscle relaxant from Valium to Robaxin. Start robaxin (methocarbamol) as prescribed by emergency room today as a muscle relaxer as needed (start with one, but could take up to 2 at a time if needed). Tramadol as needed only for pain.   I will refer you to orthopaedist and physical therapist.  If you have not seen them in the next 2 weeks, or medicines have run out prior to 2 weeks, - return to see myself or Dr. Conley Rolls in follow up to discuss medications and plan further.  I will refer you to a urologist for incontinence, which appears to be urge incontinence from the evaluation in the hospital. See information below on this.    Return to the clinic or go to the nearest emergency room if any of your symptoms worsen  or new symptoms occur.    Back  Pain, Adult Low back pain is very common. About 1 in 5 people have back pain.The cause of low back pain is rarely dangerous. The pain often gets better over time.About half of people with a sudden onset of back pain feel better in just 2 weeks. About 8 in 10 people feel better by 6 weeks.  CAUSES Some common causes of back pain include:  Strain of the muscles or ligaments supporting the spine.  Wear and tear (degeneration) of the spinal discs.  Arthritis.  Direct injury to the back. DIAGNOSIS Most of the time, the direct cause of low back pain is not known.However, back pain can be treated effectively even when the exact cause of the pain is unknown.Answering your caregiver's questions about your overall health and symptoms is one of the most accurate ways to make sure the cause of your pain is not dangerous. If your caregiver needs more information, he or she may order lab work or imaging tests (X-rays or MRIs).However, even if imaging tests show changes in your back, this usually does not require surgery. HOME CARE INSTRUCTIONS For many people, back pain returns.Since low back pain is rarely dangerous, it is often a condition that people can learn to [facility] their own.   Remain active. It is stressful on the back to sit or stand in one place. Do not sit, drive, or stand in one place for more than 30 minutes at a time. Take short walks on level surfaces as soon as pain allows.Try to increase the length of time you walk each day.  Do not stay in bed.Resting more than 1 or 2 days can delay your recovery.  Do not avoid exercise or work.Your body is made to move.It is not dangerous to be active, even though your back may hurt.Your back will likely heal faster if you return to being active before your pain is gone.  Pay attention to your body when you bend and lift. Many people have less discomfortwhen lifting if they bend their knees, keep the load close to their bodies,and avoid  twisting. Often, the most comfortable positions are those that put less stress on your recovering back.  Find a comfortable position to sleep. Use a firm mattress and lie on your side with your knees slightly bent. If you lie on your back, put a pillow under your knees.  Only take over-the-counter or prescription medicines as directed by your caregiver. Over-the-counter medicines to reduce pain and inflammation are often the most helpful.Your caregiver may prescribe muscle relaxant drugs.These medicines help dull your pain so you can more quickly return to your normal activities and healthy exercise.  Put ice on the injured area.  Put ice in a plastic bag.  Place a towel between your skin and the bag.  Leave the ice on for 15-20 minutes, 03-04 times a day for the first 2 to 3 days. After that, ice and heat may be alternated to reduce pain and spasms.  Ask your caregiver about trying back exercises and gentle massage. This may be of some benefit.  Avoid feeling anxious or stressed.Stress increases muscle tension and can worsen back pain.It is important to recognize when you are anxious or stressed and learn ways to manage it.Exercise is a great option. SEEK MEDICAL CARE IF:  You have pain that is not relieved with rest or medicine.  You have pain that does not improve in 1 week.  You have new symptoms.  You are generally not feeling well. SEEK IMMEDIATE MEDICAL CARE IF:   You have pain that radiates from your back into your legs.  You develop new bowel or bladder control problems.  You have unusual weakness or numbness in your arms or legs.  You develop nausea or vomiting.  You develop abdominal pain.  You feel faint. Document Released: 06/18/2005 Document Revised: 12/18/2011 Document Reviewed: 10/20/2013 [facility] Patient Information 2015 [place], [place]. This information is not intended to replace advice given to you by your health care provider. Make sure you discuss any  questions you have with your health care provider.     Urinary Incontinence Urinary incontinence is the involuntary loss of urine from your bladder. CAUSES  There are many causes of urinary incontinence. They include:  Medicines.  Infections.  Prostatic enlargement, leading to overflow of urine from your bladder.  Surgery.  Neurological diseases.  Emotional factors. SIGNS AND SYMPTOMS Urinary Incontinence can be divided into four types:  Urge incontinence. Urge incontinence is the involuntary loss of urine before you have the opportunity to go to the bathroom. There is a sudden urge to void but not enough time to reach a bathroom.  Stress incontinence. Stress incontinence is the sudden loss of urine with any activity that forces urine to pass. It is commonly caused by anatomical changes to the pelvis and sphincter areas of your body.  Overflow incontinence. Overflow incontinence is the loss of urine from an obstructed opening to your bladder. This results in a backup of urine and a resultant buildup of pressure within the bladder. When the pressure within the bladder exceeds the closing pressure of the sphincter, the urine overflows, which causes incontinence, similar to water overflowing a dam.  Total incontinence. Total incontinence is the loss of urine as a result of the inability to store urine within your bladder. DIAGNOSIS  Evaluating the cause of incontinence may require:  A thorough and complete medical and obstetric history.  A complete physical exam.  Laboratory tests such as a urine culture and sensitivities. When additional tests are indicated, they can include:  An ultrasound exam.  Kidney and bladder X-rays.  Cystoscopy. This is an exam of the bladder using a narrow scope.  Urodynamic testing to test the nerve function to the bladder and sphincter areas. TREATMENT  Treatment for urinary incontinence depends on the cause:  For urge incontinence caused  by a bacterial infection, antibiotics will be prescribed. If the urge incontinence is related to medicines you take, your health care provider may have you change the medicine.  For stress incontinence, surgery to re-establish anatomical support to the bladder or sphincter, or both, will often correct the condition.  For overflow incontinence caused by an enlarged prostate, an operation to open the channel through the enlarged prostate will allow the flow of urine out of the bladder. In women with fibroids, a hysterectomy may be recommended.  For total incontinence, surgery on your urinary sphincter may help. An artificial urinary sphincter (an inflatable cuff placed around the urethra) may be required. In women who have developed a hole-like passage between their bladder and vagina (vesicovaginal fistula), surgery to close the fistula often is required. HOME CARE INSTRUCTIONS  Normal daily hygiene and the use of pads or adult diapers that are changed regularly will help prevent odors and skin damage.  Avoid caffeine. It can overstimulate your bladder.  Use the bathroom regularly. Try about every 2-3 hours to go to the bathroom, even if you do not  feel the need to do so. Take time to empty your bladder completely. After urinating, wait a minute. Then try to urinate again.  For causes involving nerve dysfunction, keep a log of the medicines you take and a journal of the times you go to the bathroom. SEEK MEDICAL CARE IF:  You experience worsening of pain instead of improvement in pain after your procedure.  Your incontinence becomes worse instead of better. SEE IMMEDIATE MEDICAL CARE IF:  You experience fever or shaking chills.  You are unable to pass your urine.  You have redness spreading into your groin or down into your thighs. MAKE SURE YOU:   Understand these instructions.   Will watch your condition.  Will get help right away if you are not doing well or get worse. Document  Released: 07/26/2004 Document Revised: 04/08/2013 Document Reviewed: 11/25/2012 [employer] [employer] Patient Information 2015 [place], [place]. This information is not intended to replace advice given to you by your health care provider. Make sure you discuss any questions you have with your health care provider.     I personally performed the services described in this documentation, which was scribed in my presence. The recorded information has been reviewed and considered, and addended by me as needed.

## 2015-01-26 NOTE — ED Provider Notes (Signed)
CSN: [id number]     Arrival date & time 01/26/15  1705 History  This chart was scribed for Wynetta Emery, PA-C, working with Rolan Bucco, MD by Chestine Spore, ED Scribe. The patient was seen in room TR09C/TR09C at 6:05 PM.    Chief Complaint  Patient presents with  . Back Pain     The history is provided by the patient. No language interpreter was used.    HPI Comments: Tracy Larson is a 59 y.o. female with no significant medical hx who presents to the Emergency Department complaining of low back pain onset 2 days. She reports that the back pain does radiate to her left leg. Pt reports that she had a back injury that she was admitted for 6 weeks ago and dx with dislocated disc. Pt has been taking oxycodone, ibuprofen, and tramadol that she has not had for 2 days. Pt reports that her PCP is trying to decrease her medication intake. Pt reports that she is here for the medication and an ortho referral at this time. She states that she is having associated symptoms of left leg spasm and unchanged bowel/bladder incontinence (Chart review shows MRI with neurosurgery consult with no need for surgery and it has not changed from baseline). Pt denies any other symptoms. Pt PCP is Dr. Conley Rolls at [facility].   Past Medical History  Diagnosis Date  . Adrenal hyperplasia    Past Surgical History  Procedure Laterality Date  . Cesarean section     No family history on file. History  Substance Use Topics  . Smoking status: Never Smoker   . Smokeless tobacco: Not on file  . Alcohol Use: No   OB History    No data available     Review of Systems  Gastrointestinal:       Bowel incontinence (unchanged from last workup)  Genitourinary:       Bladder incontinence (unchanged from last workup)    A complete 10 system review of systems was obtained and all systems are negative except as noted in the HPI and PMH.    Allergies  Review of patient's allergies indicates no known  allergies.  Home Medications   Prior to Admission medications   Medication Sig Start Date End Date Taking? Authorizing Provider  diazepam (VALIUM) 5 MG tablet Take 5 mg by mouth every 6 (six) hours as needed for anxiety.    Historical Provider, MD  Ginsengs-Royal Jelly (GINSENG COMPLEX/ROYAL JELLY PO) Take 800 mg by mouth 2 (two) times daily.    Historical Provider, MD  hydrocortisone (CORTEF) 10 MG tablet Take 10 mg by mouth daily.    Historical Provider, MD  ibuprofen (ADVIL,MOTRIN) 800 MG tablet Take 1 tablet (800 mg total) by mouth 3 (three) times daily. With food, no other NSAIDS 12/14/14   Thao P Le, DO  oxyCODONE (OXY IR/ROXICODONE) 5 MG immediate release tablet Take 1-2 tablets (5-10 mg total) by mouth every 4 (four) hours as needed for breakthrough pain. Monitor for constipation 12/14/14   Thao P Le, DO  tiZANidine (ZANAFLEX) 4 MG tablet Take 1 tablet (4 mg total) by mouth every 8 (eight) hours as needed for muscle spasms. 12/14/14   Thao P Le, DO  traMADol (ULTRAM) 50 MG tablet Take 1 tablet (50 mg total) by mouth every 6 (six) hours as needed for moderate pain. 12/07/14   Shade Flood, MD  vitamin B-12 (CYANOCOBALAMIN) 500 MCG tablet Take 500 mcg by mouth daily.    Historical  Provider, MD   BP 122/71 mmHg  Pulse 103  Temp(Src) 98.1 F (36.7 C) (Oral)  Resp 16  SpO2 95% Physical Exam  Constitutional: She is oriented to person, place, and time. She appears well-developed and well-nourished. No distress.  HENT:  Head: Normocephalic and atraumatic.  Eyes: EOM are normal.  Neck: Neck supple. No tracheal deviation present.  Cardiovascular: Normal rate.   Pulmonary/Chest: Effort normal. No respiratory distress.  Musculoskeletal: Normal range of motion.       Back:  Diffuse exquisitely tender to very light palpation of lower back.  No point tenderness to percussion of lumbar spinal processes.  Strength is 5 out of 5 to bilateral lower extremities at hip and knee; extensor hallucis  longus 5 out of 5. Ankle strength 5 out of 5, no clonus, neurovascularly intact. No saddle anaesthesia. Patellar reflexes are 2+ bilaterally.       Neurological: She is alert and oriented to person, place, and time.  Skin: Skin is warm and dry.  Psychiatric: She has a normal mood and affect. Her behavior is normal.  Nursing note and vitals reviewed.   ED Course  Procedures (including critical care time) DIAGNOSTIC STUDIES: Oxygen Saturation is 95% on RA, adequate by my interpretation.    COORDINATION OF CARE: 6:10 PM-Discussed treatment plan which includes referral to orthopedist and robaxin Rx with pt at bedside and pt agreed to plan.   Labs Review Labs Reviewed - No data to display  Imaging Review No results found.   EKG Interpretation None      MDM   Final diagnoses:  Chronic back pain  Medication refill    Filed Vitals:   01/26/15 1750  BP: 122/71  Pulse: 103  Temp: 98.1 F (36.7 C)  TempSrc: Oral  Resp: 16  SpO2: 95%    Medications  oxyCODONE (Oxy IR/ROXICODONE) immediate release tablet 5 mg (5 mg Oral Given 01/26/15 1816)  ibuprofen (ADVIL,MOTRIN) tablet 800 mg (600 mg Oral Given 01/26/15 1816)    Tracy Larson is a pleasant 59 y.o. female presenting with exacerbation of her chronic low back pain. Patient was seen for similar 6 weeks ago, she had incontinence to urine and bowel incontinence. She had an MRI at that time, neurosurgery was consulted and recommended no intervention. Patient has persistent unchanged incontinence, neuro exam today nonfocal, chart review shows the patient was seen at [facility] family medicine, notes a retrying to wean her off Valium and narcotics. Explained to patient that we do not refill controlled substance medications in the ED. She will be given dose of oxycodone and Robaxin for pain control at home. Advised her to follow with orthopedics in family practice.  Evaluation does not show pathology that would require ongoing emergent  intervention or inpatient treatment. Pt is hemodynamically stable and mentating appropriately. Discussed findings and plan with patient/guardian, who agrees with care plan. All questions answered. Return precautions discussed and outpatient follow up given.   Discharge Medication List as of 01/26/2015  6:17 PM    START taking these medications   Details  methocarbamol (ROBAXIN) 500 MG tablet Take 2 tablets (1,000 mg total) by mouth 4 (four) times daily as needed (Pain)., Starting 01/26/2015, Until Discontinued, Print         I personally performed the services described in this documentation, which was scribed in my presence. The recorded information has been reviewed and is accurate.    Joni Reining Florence Yeung, PA-C 01/27/15 1610  Rolan Bucco, MD 01/27/15 2121

## 2015-01-26 NOTE — Patient Instructions (Addendum)
Based on your use of hydrocortisone each day, we can hold off on using the ibuprofen that was prescribed by the emergency room (as use of both of these medicines may increase risk of stomach ulcer).   As you're able to wean use of the Valium and the other pain medications to every few days, I agree with changing your muscle relaxant from Valium to Robaxin. Start robaxin (methocarbamol) as prescribed by emergency room today as a muscle relaxer as needed (start with one, but could take up to 2 at a time if needed). Tramadol as needed only for pain.   I will refer you to orthopaedist and physical therapist.  If you have not seen them in the next 2 weeks, or medicines have run out prior to 2 weeks, - return to see myself or Dr. Conley Rolls in follow up to discuss medications and plan further.  I will refer you to a urologist for incontinence, which appears to be urge incontinence from the evaluation in the hospital. See information below on this.    Return to the clinic or go to the nearest emergency room if any of your symptoms worsen or new symptoms occur.    Back Pain, Adult Low back pain is very common. About 1 in 5 people have back pain.The cause of low back pain is rarely dangerous. The pain often gets better over time.About half of people with a sudden onset of back pain feel better in just 2 weeks. About 8 in 10 people feel better by 6 weeks.  CAUSES Some common causes of back pain include:  Strain of the muscles or ligaments supporting the spine.  Wear and tear (degeneration) of the spinal discs.  Arthritis.  Direct injury to the back. DIAGNOSIS Most of the time, the direct cause of low back pain is not known.However, back pain can be treated effectively even when the exact cause of the pain is unknown.Answering your caregiver's questions about your overall health and symptoms is one of the most accurate ways to make sure the cause of your pain is not dangerous. If your caregiver needs more  information, he or she may order lab work or imaging tests (X-rays or MRIs).However, even if imaging tests show changes in your back, this usually does not require surgery. HOME CARE INSTRUCTIONS For many people, back pain returns.Since low back pain is rarely dangerous, it is often a condition that people can learn to [facility] their own.   Remain active. It is stressful on the back to sit or stand in one place. Do not sit, drive, or stand in one place for more than 30 minutes at a time. Take short walks on level surfaces as soon as pain allows.Try to increase the length of time you walk each day.  Do not stay in bed.Resting more than 1 or 2 days can delay your recovery.  Do not avoid exercise or work.Your body is made to move.It is not dangerous to be active, even though your back may hurt.Your back will likely heal faster if you return to being active before your pain is gone.  Pay attention to your body when you bend and lift. Many people have less discomfortwhen lifting if they bend their knees, keep the load close to their bodies,and avoid twisting. Often, the most comfortable positions are those that put less stress on your recovering back.  Find a comfortable position to sleep. Use a firm mattress and lie on your side with your knees slightly bent. If you  lie on your back, put a pillow under your knees.  Only take over-the-counter or prescription medicines as directed by your caregiver. Over-the-counter medicines to reduce pain and inflammation are often the most helpful.Your caregiver may prescribe muscle relaxant drugs.These medicines help dull your pain so you can more quickly return to your normal activities and healthy exercise.  Put ice on the injured area.  Put ice in a plastic bag.  Place a towel between your skin and the bag.  Leave the ice on for 15-20 minutes, 03-04 times a day for the first 2 to 3 days. After that, ice and heat may be alternated to reduce pain  and spasms.  Ask your caregiver about trying back exercises and gentle massage. This may be of some benefit.  Avoid feeling anxious or stressed.Stress increases muscle tension and can worsen back pain.It is important to recognize when you are anxious or stressed and learn ways to manage it.Exercise is a great option. SEEK MEDICAL CARE IF:  You have pain that is not relieved with rest or medicine.  You have pain that does not improve in 1 week.  You have new symptoms.  You are generally not feeling well. SEEK IMMEDIATE MEDICAL CARE IF:   You have pain that radiates from your back into your legs.  You develop new bowel or bladder control problems.  You have unusual weakness or numbness in your arms or legs.  You develop nausea or vomiting.  You develop abdominal pain.  You feel faint. Document Released: 06/18/2005 Document Revised: 12/18/2011 Document Reviewed: 10/20/2013 [facility] Patient Information 2015 [place], [place]. This information is not intended to replace advice given to you by your health care provider. Make sure you discuss any questions you have with your health care provider.     Urinary Incontinence Urinary incontinence is the involuntary loss of urine from your bladder. CAUSES  There are many causes of urinary incontinence. They include:  Medicines.  Infections.  Prostatic enlargement, leading to overflow of urine from your bladder.  Surgery.  Neurological diseases.  Emotional factors. SIGNS AND SYMPTOMS Urinary Incontinence can be divided into four types:  Urge incontinence. Urge incontinence is the involuntary loss of urine before you have the opportunity to go to the bathroom. There is a sudden urge to void but not enough time to reach a bathroom.  Stress incontinence. Stress incontinence is the sudden loss of urine with any activity that forces urine to pass. It is commonly caused by anatomical changes to the pelvis and sphincter areas of  your body.  Overflow incontinence. Overflow incontinence is the loss of urine from an obstructed opening to your bladder. This results in a backup of urine and a resultant buildup of pressure within the bladder. When the pressure within the bladder exceeds the closing pressure of the sphincter, the urine overflows, which causes incontinence, similar to water overflowing a dam.  Total incontinence. Total incontinence is the loss of urine as a result of the inability to store urine within your bladder. DIAGNOSIS  Evaluating the cause of incontinence may require:  A thorough and complete medical and obstetric history.  A complete physical exam.  Laboratory tests such as a urine culture and sensitivities. When additional tests are indicated, they can include:  An ultrasound exam.  Kidney and bladder X-rays.  Cystoscopy. This is an exam of the bladder using a narrow scope.  Urodynamic testing to test the nerve function to the bladder and sphincter areas. TREATMENT  Treatment for urinary incontinence  depends on the cause:  For urge incontinence caused by a bacterial infection, antibiotics will be prescribed. If the urge incontinence is related to medicines you take, your health care provider may have you change the medicine.  For stress incontinence, surgery to re-establish anatomical support to the bladder or sphincter, or both, will often correct the condition.  For overflow incontinence caused by an enlarged prostate, an operation to open the channel through the enlarged prostate will allow the flow of urine out of the bladder. In women with fibroids, a hysterectomy may be recommended.  For total incontinence, surgery on your urinary sphincter may help. An artificial urinary sphincter (an inflatable cuff placed around the urethra) may be required. In women who have developed a hole-like passage between their bladder and vagina (vesicovaginal fistula), surgery to close the fistula often is  required. HOME CARE INSTRUCTIONS  Normal daily hygiene and the use of pads or adult diapers that are changed regularly will help prevent odors and skin damage.  Avoid caffeine. It can overstimulate your bladder.  Use the bathroom regularly. Try about every 2-3 hours to go to the bathroom, even if you do not feel the need to do so. Take time to empty your bladder completely. After urinating, wait a minute. Then try to urinate again.  For causes involving nerve dysfunction, keep a log of the medicines you take and a journal of the times you go to the bathroom. SEEK MEDICAL CARE IF:  You experience worsening of pain instead of improvement in pain after your procedure.  Your incontinence becomes worse instead of better. SEE IMMEDIATE MEDICAL CARE IF:  You experience fever or shaking chills.  You are unable to pass your urine.  You have redness spreading into your groin or down into your thighs. MAKE SURE YOU:   Understand these instructions.   Will watch your condition.  Will get help right away if you are not doing well or get worse. Document Released: 07/26/2004 Document Revised: 04/08/2013 Document Reviewed: 11/25/2012 [facility] Patient Information 2015 [place], [place]. This information is not intended to replace advice given to you by your health care provider. Make sure you discuss any questions you have with your health care provider.

## 2015-01-26 NOTE — ED Notes (Signed)
Pt reports to the ED for eval of medication refill. She reports she had a back injury and was hospitalized 6 weeks ago and dx with a dislocated disc. She reports she was taking oxycodone, Ibuprofen, and Tramadol but she has run out of medications. She has not had her medications x 2 days. Reports she is having lower back spasm and it is radiating into her left leg. She also reports numbness and tingling to her left leg which she has had since the accident. Need med refill and ortho referral. Pt A&Ox4, resp e/u, and skin warm and dry.

## 2015-01-26 NOTE — Discharge Instructions (Signed)
For pain control you may take up to  of Motrin (also known as ibuprofen). That is usually 4 over the counter pills,  3 times a day. Take with food to minimize stomach irritation   For breakthrough pain you may take Robaxin. Do not drink alcohol, drive or operate heavy machinery when taking Robaxin.  Please follow with your primary care doctor in the next 2 days for a check-up. They must obtain records for further management.   Do not hesitate to return to the Emergency Department for any new, worsening or concerning symptoms.   Chronic Back Pain  When back pain lasts longer than 3 months, it is called chronic back pain.People with chronic back pain often go through certain periods that are more intense (flare-ups).  CAUSES Chronic back pain can be caused by wear and tear (degeneration) on different structures in your back. These structures include:  The bones of your spine (vertebrae) and the joints surrounding your spinal cord and nerve roots (facets).  The strong, fibrous tissues that connect your vertebrae (ligaments). Degeneration of these structures may result in pressure on your nerves. This can lead to constant pain. HOME CARE INSTRUCTIONS  Avoid bending, heavy lifting, prolonged sitting, and activities which make the problem worse.  Take brief periods of rest throughout the day to reduce your pain. Lying down or standing usually is better than sitting while you are resting.  Take over-the-counter or prescription medicines only as directed by your caregiver. SEEK IMMEDIATE MEDICAL CARE IF:   You have weakness or numbness in one of your legs or feet.  You have trouble controlling your bladder or bowels.  You have nausea, vomiting, abdominal pain, shortness of breath, or fainting. Document Released: 07/26/2004 Document Revised: 09/10/2011 Document Reviewed: 06/02/2011 [facility] Patient Information 2015 [place], [place]. This information is not intended to replace advice given  to you by your health care provider. Make sure you discuss any questions you have with your health care provider.

## 2015-01-27 ENCOUNTER — Other Ambulatory Visit: Payer: Self-pay | Admitting: Internal Medicine

## 2015-01-27 ENCOUNTER — Telehealth: Payer: Self-pay

## 2015-01-27 NOTE — Telephone Encounter (Signed)
I contacted the pt and left a voicemail advising her to please call and scheduled a 3 month follow up with Dr. [name]. Pt also advising Rx for Hydrocortisone has been approved.

## 2015-01-27 NOTE — Telephone Encounter (Signed)
Please advise if we can refill this medication. Rx is listed under a historical provider. Thanks!

## 2015-01-28 ENCOUNTER — Ambulatory Visit: Payer: Self-pay | Admitting: Internal Medicine

## 2015-02-07 ENCOUNTER — Ambulatory Visit (INDEPENDENT_AMBULATORY_CARE_PROVIDER_SITE_OTHER): Admitting: Internal Medicine

## 2015-02-07 VITALS — BP 120/76 | HR 89 | Temp 98.1°F | Resp 12 | Wt 253.0 lb

## 2015-02-07 DIAGNOSIS — E259 Adrenogenital disorder, unspecified: Secondary | ICD-10-CM

## 2015-02-07 DIAGNOSIS — M8580 Other specified disorders of bone density and structure, unspecified site: Secondary | ICD-10-CM

## 2015-02-07 DIAGNOSIS — E250 Congenital adrenogenital disorders associated with enzyme deficiency: Secondary | ICD-10-CM

## 2015-02-07 LAB — VITAMIN D 25 HYDROXY (VIT D DEFICIENCY, FRACTURES): VITD: 11.32 ng/mL — ABNORMAL LOW (ref 30.00–100.00)

## 2015-02-07 LAB — POTASSIUM: Potassium: 3.9 mEq/L (ref 3.5–5.1)

## 2015-02-07 NOTE — Progress Notes (Signed)
Patient ID: Tracy Larson, female   DOB: 11-22-1955, 59 y.o.   MRN: [id number]   HPI  Tracy Larson is a 59 y.o.-year-old female, initially referred by her PCP, Dr. Marin Comment, for management of congenital adrenal hyperplasia (I believe this is non-classical form) and adrenal insufficiency. She has been followed by Dr. Buddy Duty, but had to switch dr's b/c outstanding bill with his office. Last visit with me 6 mo ago.  Reviewed and addended hx: Pt. has been dx with adrenal insufficiency at birth and then 59 y/o - became pregnant (she had 4 children - no infertility).   She did not have to have vaginoplasty.  She is also on fludrocortisone (when , but developed edema >> had to stop.  She has h/o adrenal crises:   1985   2005 - 2/2 flu [facility]).  She did not have genetic counseling at dx.  Pt is on replacement with hydrocortisone - initially on 15 mg a day, but over the years she had to increase to 100 mg daily for 15 years when her children were growing, then decreased to 50 mg daily for another 10 years, then 40 mg for 16 years. At last visit, we discussed about side effects of over replacement with hydrocortisone, and I suggested that she decreases the dose to 15 mg twice a day. She could not remember this, but she actually decreased to 10 mg 2x a day. She initially had headaches, then started to feel well on this dose.   She does describe + salt-craving since last visit. No sxs of orthostasis.  She is aware that she needs to increase the dose of her glucocorticoid (GC) if she has a fever >101F and she needs to get GC injectable if she cannot take po.   She does not have weight gain, DM2, HTN, or osteoporosis/fractures.   She had a DEXA scan in 2013: [facility] Lumbar spine (L1-L3) Femoral neck (FN) 33% distal radius  T-score + 0.5 RFN:- 0.1 LFN:+ 0.5 n/a   She has hirsutism - maybe increased on chin since last visit . No acne.   Reviewed  pertinent labs: Received records  from Dr. Buddy Duty: - Patient was previously on fludrocortisone, however, this rendered here hypertensive. Fludrocortisone was eventually stopped. Dr. Buddy Duty mentioned that she is on a high dose of steroids, and probably her many years of over replacement with steroids have caused her secondary adrenal insufficiency.  Labs from 2014 were reviewed: 09/24/2012: - 17 hydroxyprogesterone 6180 - Hemoglobin A1c 5.4% - Lipids: 176/94/40/126 - TSH 1.94 - LFTs normal - ACTH 201.7 - DHEAS 42.5 - Aldosterone <1, PRA 1.37  Component     Latest Ref Rng 08/06/2014  Testosterone     10 - 70 ng/dL 66  Sex Hormone Binding     14 - 73 nmol/L 25  Testosterone Free     0.6 - 6.8 pg/mL 13.9 (H)  Testosterone-% Free     0.4 - 2.4 % 2.1  PRA LC/MS/MS     0.25 - 5.82 ng/mL/h 1.67  ALDO / PRA Ratio     0.9 - 28.9 Ratio 1.8  ALDOSTERONE      3  DHEA     102 - 1185 ng/dL 27 (L)  Androstenedione      10  17-OH-Progesterone, LC/MS/MS      2014 (H)  C206 ACTH     6 - 50 pg/mL 78 (H)   Component     Latest Ref Rng 11/27/2014  Sodium  135 - 145 mmol/L 137  Potassium     3.5 - 5.1 mmol/L 3.6  Chloride     101 - 111 mmol/L 105  CO2     22 - 32 mmol/L 22  Glucose     65 - 99 mg/dL 104 (H)  BUN     6 - 20 mg/dL 6  Creatinine     0.44 - 1.00 mg/dL 0.64  Calcium     8.9 - 10.3 mg/dL 8.6 (L)  EGFR (Non-African Amer.)     >60 mL/min >60  EGFR (African American)     >60 mL/min >60  Anion gap     5 - 15 10   Meals: Mostly fast foods  ROS: Constitutional: + weight gain, + increased appetite,  no fatigue, no subjective hyperthermia/hypothermia, + poor sleep Eyes: no blurry vision, no xerophthalmia ENT: no sore throat, no nodules palpated in throat, no dysphagia/odynophagia, no hoarseness Cardiovascular: + CP/+ SOB/no palpitations/leg swelling Respiratory: no cough/+ SOB Gastrointestinal: no N/V/D/C Musculoskeletal: +both: muscle/joint aches Skin: no rashes Neurological: no  tremors/numbness/tingling/dizziness, + HA  I reviewed pt's medications, allergies, PMH, social hx, family hx, and changes were documented in the history of present illness. Otherwise, unchanged from my initial visit note. Started Baclofen, Neurontin - for back pain.  Past Medical History  Diagnosis Date  . Adrenal hyperplasia    Past surgical history: - C sections  History   Social History  . Marital Status: Married    Spouse Name: [name]    Number of Children: 4   Occupational History  .  nurse tech    Social History Main Topics  . Smoking status: Never Smoker   . Smokeless tobacco: Not on file  . Alcohol Use: No  . Drug Use: No   Current Outpatient Prescriptions on File Prior to Visit  Medication Sig Dispense Refill  . Ginsengs-Royal Jelly (GINSENG COMPLEX/ROYAL JELLY PO) Take 800 mg by mouth 2 (two) times daily.    . hydrocortisone (CORTEF) 10 MG tablet Take 10 mg by mouth 2 (two) times daily.     . traMADol (ULTRAM) 50 MG tablet Take 1 tablet (50 mg total) by mouth every 6 (six) hours as needed for moderate pain. 20 tablet 0  . vitamin B-12 (CYANOCOBALAMIN) 500 MCG tablet Take 500 mcg by mouth daily.    . hydrocortisone (CORTEF) 10 MG tablet TAKE ONE AND ONE-HALF TABLETS (15 MG TOTAL) TWICE A DAY (Patient not taking: Reported on 02/07/2015) 360 tablet 0  . methocarbamol (ROBAXIN) 500 MG tablet Take 2 tablets (1,000 mg total) by mouth 4 (four) times daily as needed (Pain). (Patient not taking: Reported on 02/07/2015) 20 tablet 0   No current facility-administered medications on file prior to visit.   No Known Allergies   No family history of diabetes, hypertension, hyperlipidemia, heart disease, cancer.   PE: BP 120/76 mmHg  Pulse 89  Temp(Src) 98.1 F (36.7 C) (Oral)  Resp 12  Wt 253 lb (114.76 kg)  SpO2 98% Body mass index is 48.58 kg/(m^2). No orthostasis:  Wt Readings from Last 3 Encounters:  02/07/15 253 lb (114.76 kg)  01/26/15 252 lb 9.6 oz (114.579 kg)    12/14/14 250 lb (113.399 kg)   Constitution: Obese, in NAD, +full supraclavicular fat pads  Eyes: PERRLA, EOMI, no exophthalmos ENT: moist mucous membranes, no thyromegaly, no cervical lymphadenopathy Cardiovascular: RRR, No MRG Respiratory: CTA B Gastrointestinal: abdomen soft, NT, ND, BS+ Musculoskeletal: no deformities, strength intact in all 4 Skin: moist,  warm, no rashes; + hirsutism on chin  Neurological: no tremor with outstretched hands, DTR normal in all 4  ASSESSMENT: 1. Nonclassical CAH - CAH is a condition in which the adrenal glands cannot produce enough cortisol and the hormone production is redirected towards androgen production via increased ACTH. Therefore, we tx this with glucocorticoids not only to treat the adrenal insufficiency, but also to decrease the ACTH and, therefore, adrenal androgens. OCPs also help in reducing the androgens.   2. Adrenal insufficiency - Associated with her CAH   PLAN:  Patient has a history of lifelong CAH, most likely nonclassical CAH with adrenal insufficiency, since she did not have to have vaginoplasty and she was successful in getting pregnant with 4 children. - as of now, she does not appear to need fludrocortisone - no sodium or potassium abnormalities per the most recent BMP and also she is not orthostatic today or complaining of orthostatic symptoms. Since we changed the dose of hydrocortisone at last visit, I would like to repeat the PRA and aldosterone level to again evaluate her salt-fluid balance. Of note, she has a high salt diet - eating mostly fast food, and she is also on hydrocortisone which has mineralocorticoid activity, so it is very likely that she does not need for fludrocortisone.  - we  again discussed about proper replacement with Hydrocortisone (HC) >>  I explained side effects of overreplacement on many organs in the body and the fact that we need to decrease the dose to the minimum dose that allows her to feel well  but also to suppress ACTH and androgens (17 hydroxyprogesterone, androstenedione, DHEAS, testosterone) to normal or slightly above normal. She has been on tremendously high doses of hydrocortisone throughout her life, but she does not appear to have many complications from this other than obesity. She does not have osteoporosis (I would like to check another DEXA scan 3 years after the previous, and patient agrees), diabetes, hypertension. We will continue the current dose of hydrocortisone of 10 mg twice a day.  - a bid or tid HC dose is likely best, with the highest dose at bedtime (which is opposite to treating simple adrenal insufficiency).  - I reiterated sick days rules for HC:  If you cannot keep anything down, including your hydrocortisone medication, please go to the emergency room or your primary care physician office to get steroids injected in the muscle or vein.  If you have a fever (more than 101 Fahrenheit) or gastroenteritis with nausea/vomiting and diarrhea, please double the dose of your hydrocortisone for the duration of the fever or the gastroenteritis.  Do not run out of your hydrocortisone medication. -  I again advised for [employer] bracelet mentioning "adrenal insufficiency".I gave her an order form for this.  -  I will see the patient back in a year  Orders Placed This Encounter  Procedures  . DG Bone Density    Standing Status: Future     Number of Occurrences:      Standing Expiration Date: 04/08/2016    Order Specific Question:  Reason for Exam (SYMPTOM  OR DIAGNOSIS REQUIRED)    Answer:  CAH    Order Specific Question:  Is the patient pregnant?    Answer:  No    Order Specific Question:  Preferred imaging location?    Answer:  [facility]  . Aldosterone + renin activity w/ ratio  . Potassium  . ACTH  . DHEA-sulfate  . 17-Hydroxyprogesterone  . Vitamin D (25  hydroxy)   Component     Latest Ref Rng 02/07/2015  PRA LC/MS/MS     0.25 - 5.82 ng/mL/h 5.73   ALDO / PRA Ratio     0.9 - 28.9 Ratio 1.6  ALDOSTERONE      9  Potassium     3.5 - 5.1 mEq/L 3.9  17-OH-Progesterone, LC/MS/MS      1574 (H)  C206 ACTH     6 - 50 pg/mL 57 (H)  DHEA-SO4     8 - 188 ug/dL 26  VITD     30.00 - 100.00 ng/mL 11.32 (L)   Vitamin D very low >> will start ergocalciferol 50,000 units weekly x 8 weeks, then 4000 units vitamin D3 daily.  The adrenal labs continue to improve despite decreasing her hydrocortisone dose.  DEXA pending.

## 2015-02-07 NOTE — Patient Instructions (Signed)
We will call you with the DEXA scan schedule.  Please continue Hydrocortisone 10 mg 2x a day.  Sick days rules for Hydrocortisone:  If you cannot keep anything down, including your hydrocortisone medication, please go to the emergency room or your primary care physician office to get steroids injected in the muscle or vein.  If you have a fever (more than 101 Fahrenheit) or gastroenteritis with nausea/vomiting and diarrhea, please double the dose of your hydrocortisone for the duration of the fever or the gastroenteritis.  Do not run out of your hydrocortisone medication.  Please return in 1 year.

## 2015-02-08 LAB — DHEA-SULFATE: DHEA SO4: 26 ug/dL (ref 8–188)

## 2015-02-09 LAB — ACTH: C206 ACTH: 57 pg/mL — ABNORMAL HIGH (ref 6–50)

## 2015-02-10 ENCOUNTER — Other Ambulatory Visit: Payer: Self-pay | Admitting: Internal Medicine

## 2015-02-10 DIAGNOSIS — M8580 Other specified disorders of bone density and structure, unspecified site: Secondary | ICD-10-CM

## 2015-02-10 DIAGNOSIS — Z7952 Long term (current) use of systemic steroids: Secondary | ICD-10-CM

## 2015-02-13 LAB — 17-HYDROXYPROGESTERONE: 17-OH-Progesterone, LC/MS/MS: 1574 ng/dL — ABNORMAL HIGH

## 2015-02-13 LAB — ALDOSTERONE + RENIN ACTIVITY W/ RATIO
ALDO / PRA Ratio: 1.6 Ratio (ref 0.9–28.9)
Aldosterone: 9 ng/dL
PRA LC/MS/MS: 5.73 ng/mL/h (ref 0.25–5.82)

## 2015-02-14 ENCOUNTER — Encounter: Payer: Self-pay | Admitting: Internal Medicine

## 2015-02-14 MED ORDER — VITAMIN D (ERGOCALCIFEROL) 1.25 MG (50000 UNIT) PO CAPS: 50000.0000 [IU] | ORAL_CAPSULE | ORAL | Status: DC

## 2015-04-11 ENCOUNTER — Other Ambulatory Visit: Payer: Self-pay | Admitting: Internal Medicine

## 2015-04-12 NOTE — Telephone Encounter (Signed)
No refill. Pt needs to switch to 4000 units daily.

## 2015-04-13 ENCOUNTER — Telehealth: Payer: Self-pay | Admitting: *Deleted

## 2015-04-13 NOTE — Telephone Encounter (Signed)
Pt called stating that she is having surgery (spinal surgery on her back); he has put her on steroids (predisone), He said that it is not a good time for Dr Elvera LennoxGherghe to decrease pt's hydrocortisone, since she is having surgery. Advised pt to have her surgeons office to contact Dr Elvera LennoxGherghe to discuss this issue. Be advised.

## 2015-04-25 ENCOUNTER — Other Ambulatory Visit: Payer: Self-pay | Admitting: Internal Medicine

## 2015-07-22 ENCOUNTER — Other Ambulatory Visit: Payer: Self-pay | Admitting: Internal Medicine

## 2016-01-20 ENCOUNTER — Other Ambulatory Visit: Payer: Self-pay | Admitting: Internal Medicine

## 2016-02-07 ENCOUNTER — Encounter: Payer: Self-pay | Admitting: Internal Medicine

## 2016-02-07 ENCOUNTER — Ambulatory Visit (INDEPENDENT_AMBULATORY_CARE_PROVIDER_SITE_OTHER): Admitting: Internal Medicine

## 2016-02-07 VITALS — BP 124/94 | HR 87 | Ht 60.0 in | Wt 250.0 lb

## 2016-02-07 DIAGNOSIS — M25572 Pain in left ankle and joints of left foot: Secondary | ICD-10-CM | POA: Diagnosis not present

## 2016-02-07 DIAGNOSIS — E250 Congenital adrenogenital disorders associated with enzyme deficiency: Secondary | ICD-10-CM | POA: Diagnosis not present

## 2016-02-07 DIAGNOSIS — E2740 Unspecified adrenocortical insufficiency: Secondary | ICD-10-CM | POA: Diagnosis not present

## 2016-02-07 DIAGNOSIS — E259 Adrenogenital disorder, unspecified: Secondary | ICD-10-CM

## 2016-02-07 DIAGNOSIS — G8929 Other chronic pain: Secondary | ICD-10-CM

## 2016-02-07 DIAGNOSIS — E559 Vitamin D deficiency, unspecified: Secondary | ICD-10-CM | POA: Diagnosis not present

## 2016-02-07 LAB — POTASSIUM: POTASSIUM: 3.9 meq/L (ref 3.5–5.1)

## 2016-02-07 LAB — VITAMIN D 25 HYDROXY (VIT D DEFICIENCY, FRACTURES): VITD: 16.22 ng/mL — AB (ref 30.00–100.00)

## 2016-02-07 MED ORDER — HYDROCORTISONE 10 MG PO TABS: ORAL_TABLET | ORAL | 3 refills | Status: DC

## 2016-02-07 NOTE — Progress Notes (Signed)
Patient ID: Tracy Larson, female   DOB: 26-Jul-1955, 60 y.o.   MRN: [id number]   HPI  Tracy PringleJennifer Larson is a 60 y.o.-year-old female, initially referred by her PCP, Dr. Conley RollsLe, for management of congenital adrenal hyperplasia (I believe this is non-classical form), adrenal insufficiency, and vitamin D deficiency. She has been followed by Dr. Sharl MaKerr, but had to switch dr's b/c outstanding bill with his office. Last visit with me 1 year ago.  She started Celebrex + Bear claw >> started to feel dizzy and have diarrhea at night, afterwards >> stopped. She is waiting for her symptoms to improve.  Reviewed and addended hx: Pt. has been dx with adrenal insufficiency at birth and then 60 y/o - became pregnant (she had 4 children - no infertility).   She did not have to have vaginoplasty.  She is also on fludrocortisone (when , but developed edema >> had to stop.  She has h/o adrenal crises:   1985   2005 - 2/2 flu [facility]  She did not have genetic counseling at dx.  Pt is on replacement with hydrocortisone - initially on 15 mg a day, but over the years she had to increase to 100 mg daily for 15 years when her children were growing, then decreased to 50 mg daily for another 10 years, then 40 mg for 16 years. We decreased the dose to 10 mg twice a day, but she is actually taking 20 mg in am and 5 mg in pm now.  She is aware that she needs to increase the dose of her glucocorticoid (GC) if she has a fever >101F and she needs to get GC injectable if she cannot take po.   She does not have weight gain, DM2, HTN, or osteoporosis/fractures.   She has hirsutism - on chin . No acne.   She had a DEXA scan in 2013: [facility] Lumbar spine (L1-L3) Femoral neck (FN) 33% distal radius  T-score + 0.5 RFN:- 0.1 LFN:+ 0.5 n/a   Her tests improved per last check: Component     Latest Ref Rng & Units 08/06/2014 02/07/2015  Testosterone     10 - 70 ng/dL 66   Sex Horm Binding Glob, Serum     14 -  73 nmol/L 25   Testosterone Free     0.6 - 6.8 pg/mL 13.9 (H)   Testosterone-% Free     0.4 - 2.4 % 2.1   PRA LC/MS/MS     0.25 - 5.82 ng/mL/h 1.67 5.73  ALDO / PRA Ratio     0.9 - 28.9 Ratio 1.8 1.6  ALDOSTERONE     ng/dL 3 9  DHEA     409102 - 8,1191,185 ng/dL 27 (L)   Androstenedione     ng/dL 10   [id number]-OH-Progesterone, LC/MS/MS     ng/dL 8,4692,014 (H) 6,2951,574 (H)  [id number] ACTH     6 - 50 pg/mL 78 (H) 57 (H)  Potassium     3.5 - 5.1 mEq/L  3.9  DHEA-SO4     8 - 188 ug/dL  26   Reviewed  records from Dr. Sharl MaKerr: - Patient was previously on fludrocortisone, however, this rendered here hypertensive. Fludrocortisone was eventually stopped. Dr. Sharl MaKerr mentioned that she is on a high dose of steroids, and probably her many years of over replacement with steroids have caused her secondary adrenal insufficiency.  Labs from 2014 were reviewed: 09/24/2012: - 17 hydroxyprogesterone 6180 - Hemoglobin A1c 5.4% - Lipids: 176/94/40/126 - TSH 1.94 -  LFTs normal - ACTH 201.7 - DHEAS 42.5 - Aldosterone <1, PRA 1.37  At last visit, we also find vitamin D deficiency we started ergocalciferol 50,000 units weekly, and advised her to switch to over-the-counter vitamin D afterwards >> did not start the OTC formulation.. Component     Latest Ref Rng & Units 02/07/2015  VITD     30.00 - 100.00 ng/mL 11.32 (L)    ROS: Constitutional: + weight gain, + increased appetite,  no fatigue, no subjective hyperthermia/hypothermia, + poor sleep Eyes: no blurry vision, no xerophthalmia ENT: no sore throat, no nodules palpated in throat, no dysphagia/odynophagia, no hoarseness Cardiovascular: no CP/+ SOB/no palpitations/leg swelling Respiratory: no cough/+ SOB Gastrointestinal: no N/V/D/C Musculoskeletal: +both: muscle/joint aches Skin: no rashes Neurological: no tremors/numbness/tingling/dizziness, + HA  I reviewed pt's medications, allergies, PMH, social hx, family hx, and changes were documented in the history of  present illness. Otherwise, unchanged from my initial visit note. Started Baclofen, Neurontin - for back pain.  Past Medical History:  Diagnosis Date  . Adrenal hyperplasia    Past surgical history: - C sections  History   Social History  . Marital Status: Married    Spouse Name: [name]    Number of Children: 4   Occupational History  .  nurse tech    Social History Main Topics  . Smoking status: Never Smoker   . Smokeless tobacco: Not on file  . Alcohol Use: No  . Drug Use: No   Current Outpatient Prescriptions on File Prior to Visit  Medication Sig Dispense Refill  . baclofen (LIORESAL) 10 MG tablet Take 10 mg by mouth every 8 (eight) hours as needed.  0  . gabapentin (NEURONTIN) 300 MG capsule TAKE 1 BY MOUTH AT BEDTIME X 1 WEEK, THEN 1 BY MOUTH 2 TIMES DAILY X 1 WEEK, THEN 1 BY MOUTH 3 TIMES  2  . Ginsengs-Royal Jelly (GINSENG COMPLEX/ROYAL JELLY PO) Take 800 mg by mouth 2 (two) times daily.    . hydrocortisone (CORTEF) 10 MG tablet Take 10 mg by mouth 2 (two) times daily.     . hydrocortisone (CORTEF) 10 MG tablet TAKE ONE AND ONE-HALF TABLETS TWICE A DAY 360 tablet 0  . hydrocortisone (CORTEF) 10 MG tablet TAKE 1 TABLET TWICE A DAY 180 tablet 1  . methocarbamol (ROBAXIN) 500 MG tablet Take 2 tablets (1,000 mg total) by mouth 4 (four) times daily as needed (Pain). (Patient not taking: Reported on 02/07/2015) 20 tablet 0  . traMADol (ULTRAM) 50 MG tablet Take 1 tablet (50 mg total) by mouth every 6 (six) hours as needed for moderate pain. 20 tablet 0  . vitamin B-12 (CYANOCOBALAMIN) 500 MCG tablet Take 500 mcg by mouth daily.    . Vitamin D, Ergocalciferol, (DRISDOL) 50000 UNITS CAPS capsule Take 1 capsule (50,000 Units total) by mouth every 7 (seven) days. 8 capsule 0   No current facility-administered medications on file prior to visit.    No Known Allergies   No family history of diabetes, hypertension, hyperlipidemia, heart disease, cancer.   PE: BP (!) 124/94 (BP  Location: Right Arm, Patient Position: Sitting)   Pulse 87   Ht 5' (1.524 m)   Wt 250 lb (113.4 kg)   SpO2 97%   BMI 48.82 kg/m  Body mass index is 48.82 kg/m.  Wt Readings from Last 3 Encounters:  02/07/16 250 lb (113.4 kg)  02/07/15 253 lb (114.8 kg)  01/26/15 252 lb 9.6 oz (114.6 kg)   Constitution: Obese,  in NAD, +full supraclavicular fat pads  Eyes: PERRLA, EOMI, no exophthalmos ENT: moist mucous membranes, no thyromegaly, no cervical lymphadenopathy Cardiovascular: RRR, No MRG Respiratory: CTA B Gastrointestinal: abdomen soft, NT, ND, BS+ Musculoskeletal: no deformities, strength intact in all 4 Skin: moist, warm, no rashes; + hirsutism on chin  Neurological: no tremor with outstretched hands, DTR normal in all 4  ASSESSMENT: 1. Nonclassical CAH - CAH = a condition in which the adrenal glands cannot produce enough cortisol and the hormone production is redirected towards androgen production via increased ACTH. Therefore, we tx this with glucocorticoids not only to treat the adrenal insufficiency, but also to decrease the ACTH and, therefore, adrenal androgens. OCPs also help in reducing the androgens.   2. Adrenal insufficiency - Associated with her CAH   3. Vitamin D deficiency  PLAN:  1. And 2. Patient has a history of lifelong CAH, most likely nonclassical CAH with adrenal insufficiency, since she did not have to have vaginoplasty and she was successful in getting pregnant with 4 children. - as of now, she does not appear to need fludrocortisone - no sodium or potassium abnormalities per the most recent BMP and also she was not orthostatic in the past. Hydrocortisone also has some mineralocorticoid activity, and she is not eating a low-salt diet. - She is recently complaining of orthostatic symptoms after starting to have diarrhea at night, after taking Celebrex + Bear claw supplement. She realized that this may be the problem for her symptoms and she actually stopped  these and is waiting for symptoms to improve. - She is still taking a supraphysiologic dose of hydrocortisone, which is reluctant to decrease. She is taking a total of 30 mg of her cortisone daily which is a decrease from more than 50 mg that she was taking before. She is taking a much higher dose of hydrocortisone in the morning compared to afternoon, which is opposite to the doses recommended for CAH, in which the higher dose is taken close to bedtime. However, she complains that she cannot sleep if she takes a higher dose later in the day. - we  again discussed about proper replacement with Hydrocortisone (HC) >>  I explained side effects of overreplacement on many organs in the body and the fact that we need to decrease the dose to the minimum dose that allows her to feel well but also to suppress ACTH and androgens (17 hydroxyprogesterone, androstenedione, DHEAS, testosterone) to normal or slightly above normal.  - For now, I advised her to try to change the hydrocortisone in the following way: Patient Instructions  Continue to stay off the Bear Claw and Turmeric.  Try to decrease the am dose of Hydrocortisone to 15 mg and to increase the pm dose to 10 mg. If this does not help, increase the hydrocortisone to 20 mg in am and 10 mg in pm for a period of time to see if symptoms get better.  Please stop at the lab.  - I reiterated sick days rules for HC:  If you cannot keep anything down, including your hydrocortisone medication, please go to the emergency room or your primary care physician office to get steroids injected in the muscle or vein.  If you have a fever (more than 101 Fahrenheit) or gastroenteritis with nausea/vomiting and diarrhea, please double the dose of your hydrocortisone for the duration of the fever or the gastroenteritis.  Do not run out of your hydrocortisone medication. -  I will see the patient back in  a year  3. Vitamin D deficiency - Will advise her to start 5000  units vitamin D3 daily - She will need the vitamin D level in 2-3 months - she can have this either here or in PCPs office.  Component     Latest Ref Rng & Units 02/07/2016  Testosterone     3 - 41 ng/dL 37  Sex Horm Binding Glob, Serum     17.3 - 125.0 nmol/L 46.7  Testosterone Free     0.0 - 4.2 pg/mL 1.8  ALDOSTERONE     0.0 - 30.0 ng/dL 1.0  Renin     1.610 - 5.380 ng/mL/hr 1.048  ALDOS/RENIN RATIO     0.0 - 30.0 1.0  Potassium     3.5 - 5.1 mEq/L 3.9  DHEA-SO4     29.4 - 220.5 ug/dL 96.0 (L)  VITD     45.40 - 100.00 ng/mL 16.22 (L)  17-Hydroxyprogesterone     ng/dL 981  ACTH     7.2 - 19.1 pg/mL 9.2   Excellent adrenal results, but DHEAS and ACTH are too suppressed, a sign of excessive hydrocortisone dosing. She is reticent to decrease the dose more, unfortunately... Vitamin D is again very low. See plan above.  [name], MD PhD [facility] Endocrinology

## 2016-02-08 LAB — TESTOSTERONE, FREE, TOTAL, SHBG
Sex Hormone Binding: 46.7 nmol/L (ref 17.3–125.0)
Testosterone, Free: 1.8 pg/mL (ref 0.0–4.2)
Testosterone: 37 ng/dL (ref 3–41)

## 2016-02-13 LAB — 17-HYDROXYPROGESTERONE: 17-Hydroxyprogesterone: 374 ng/dL

## 2016-02-13 LAB — DHEA-SULFATE: DHEA-SO4: 26.4 ug/dL — ABNORMAL LOW (ref 29.4–220.5)

## 2016-02-13 LAB — ALDOSTERONE + RENIN ACTIVITY W/ RATIO
ALDOS/RENIN RATIO: 1 (ref 0.0–30.0)
ALDOSTERONE: 1 ng/dL (ref 0.0–30.0)
Renin: 1.048 ng/mL/hr (ref 0.167–5.380)

## 2016-02-13 LAB — ACTH: ACTH: 9.2 pg/mL (ref 7.2–63.3)

## 2016-02-14 DIAGNOSIS — E559 Vitamin D deficiency, unspecified: Secondary | ICD-10-CM | POA: Insufficient documentation

## 2016-02-18 ENCOUNTER — Encounter (INDEPENDENT_AMBULATORY_CARE_PROVIDER_SITE_OTHER): Payer: Self-pay

## 2016-03-22 ENCOUNTER — Emergency Department (HOSPITAL_COMMUNITY)

## 2016-03-22 ENCOUNTER — Encounter (HOSPITAL_COMMUNITY): Payer: Self-pay | Admitting: *Deleted

## 2016-03-22 ENCOUNTER — Encounter (HOSPITAL_COMMUNITY): Payer: Self-pay | Admitting: Emergency Medicine

## 2016-03-22 ENCOUNTER — Emergency Department (HOSPITAL_COMMUNITY)
Admission: EM | Admit: 2016-03-22 | Discharge: 2016-03-23 | Disposition: A | Discharge status: Home / Self Care | Attending: Emergency Medicine | Admitting: Emergency Medicine

## 2016-03-22 ENCOUNTER — Ambulatory Visit: Payer: Self-pay

## 2016-03-22 ENCOUNTER — Ambulatory Visit (HOSPITAL_COMMUNITY)
Admission: EM | Admit: 2016-03-22 | Discharge: 2016-03-22 | Disposition: A | Discharge status: Home / Self Care | Source: Home / Self Care | Attending: Internal Medicine | Admitting: Internal Medicine

## 2016-03-22 DIAGNOSIS — R791 Abnormal coagulation profile: Secondary | ICD-10-CM | POA: Insufficient documentation

## 2016-03-22 DIAGNOSIS — N390 Urinary tract infection, site not specified: Secondary | ICD-10-CM | POA: Insufficient documentation

## 2016-03-22 DIAGNOSIS — R42 Dizziness and giddiness: Secondary | ICD-10-CM | POA: Diagnosis not present

## 2016-03-22 DIAGNOSIS — R51 Headache: Secondary | ICD-10-CM

## 2016-03-22 DIAGNOSIS — R519 Headache, unspecified: Secondary | ICD-10-CM

## 2016-03-22 DIAGNOSIS — R110 Nausea: Secondary | ICD-10-CM

## 2016-03-22 DIAGNOSIS — H81399 Other peripheral vertigo, unspecified ear: Secondary | ICD-10-CM

## 2016-03-22 DIAGNOSIS — R2689 Other abnormalities of gait and mobility: Secondary | ICD-10-CM | POA: Diagnosis not present

## 2016-03-22 LAB — DIFFERENTIAL
BASOS ABS: 0 10*3/uL (ref 0.0–0.1)
BASOS PCT: 0 %
EOS ABS: 0.1 10*3/uL (ref 0.0–0.7)
Eosinophils Relative: 2 %
LYMPHS ABS: 2.6 10*3/uL (ref 0.7–4.0)
Lymphocytes Relative: 34 %
MONO ABS: 0.5 10*3/uL (ref 0.1–1.0)
MONOS PCT: 6 %
Neutro Abs: 4.5 10*3/uL (ref 1.7–7.7)
Neutrophils Relative %: 58 %

## 2016-03-22 LAB — PROTIME-INR
INR: 1.05
Prothrombin Time: 13.7 seconds (ref 11.4–15.2)

## 2016-03-22 LAB — COMPREHENSIVE METABOLIC PANEL
ALT: 63 U/L — AB (ref 14–54)
ANION GAP: 9 (ref 5–15)
AST: 54 U/L — AB (ref 15–41)
Albumin: 4.1 g/dL (ref 3.5–5.0)
Alkaline Phosphatase: 87 U/L (ref 38–126)
BILIRUBIN TOTAL: 0.6 mg/dL (ref 0.3–1.2)
BUN: 9 mg/dL (ref 6–20)
CALCIUM: 9.3 mg/dL (ref 8.9–10.3)
CO2: 21 mmol/L — AB (ref 22–32)
Chloride: 106 mmol/L (ref 101–111)
Creatinine, Ser: 0.67 mg/dL (ref 0.44–1.00)
GFR calc Af Amer: >60 mL/min (ref >60)
GLUCOSE: 115 mg/dL — AB (ref 65–99)
Potassium: 4.3 mmol/L (ref 3.5–5.1)
Sodium: 136 mmol/L (ref 135–145)
Total Protein: 8 g/dL (ref 6.5–8.1)

## 2016-03-22 LAB — CBC
HEMATOCRIT: 48.2 % — AB (ref 36.0–46.0)
HEMOGLOBIN: 15.4 g/dL — AB (ref 12.0–15.0)
MCH: 28.3 pg (ref 26.0–34.0)
MCHC: 32 g/dL (ref 30.0–36.0)
MCV: 88.4 fL (ref 78.0–100.0)
Platelets: 238 10*3/uL (ref 150–400)
RBC: 5.45 MIL/uL — ABNORMAL HIGH (ref 3.87–5.11)
RDW: 13.4 % (ref 11.5–15.5)
WBC: 7.7 10*3/uL (ref 4.0–10.5)

## 2016-03-22 LAB — I-STAT TROPONIN, ED: Troponin i, poc: 0 ng/mL (ref 0.00–0.08)

## 2016-03-22 LAB — APTT: aPTT: 32 seconds (ref 24–36)

## 2016-03-22 MED ORDER — ONDANSETRON 4 MG PO TBDP
4.0000 mg | ORAL_TABLET | Freq: Once | ORAL | Status: AC | PRN
  Administered 2016-03-22: 4 mg via ORAL

## 2016-03-22 MED ORDER — ONDANSETRON 4 MG PO TBDP
ORAL_TABLET | ORAL | Status: AC
  Filled 2016-03-22: qty 1

## 2016-03-22 MED ORDER — ONDANSETRON 4 MG PO TBDP
4.0000 mg | ORAL_TABLET | Freq: Once | ORAL | Status: AC
  Administered 2016-03-22: 4 mg via ORAL

## 2016-03-22 MED ORDER — OXYCODONE-ACETAMINOPHEN 5-325 MG PO TABS
ORAL_TABLET | ORAL | Status: AC
  Filled 2016-03-22: qty 1

## 2016-03-22 MED ORDER — DIPHENHYDRAMINE HCL 50 MG/ML IJ SOLN
25.0000 mg | Freq: Once | INTRAMUSCULAR | Status: AC
Start: 2016-03-23 — End: 2016-03-23
  Administered 2016-03-23: 25 mg via INTRAVENOUS
  Filled 2016-03-22: qty 1

## 2016-03-22 MED ORDER — KETOROLAC TROMETHAMINE 30 MG/ML IJ SOLN
30.0000 mg | Freq: Once | INTRAMUSCULAR | Status: AC
  Administered 2016-03-23: 30 mg via INTRAVENOUS
  Filled 2016-03-22: qty 1

## 2016-03-22 MED ORDER — MECLIZINE HCL 25 MG PO TABS
50.0000 mg | ORAL_TABLET | Freq: Once | ORAL | Status: AC
  Administered 2016-03-23: 50 mg via ORAL
  Filled 2016-03-22: qty 2

## 2016-03-22 MED ORDER — SODIUM CHLORIDE 0.9 % IV BOLUS (SEPSIS)
1000.0000 mL | Freq: Once | INTRAVENOUS | Status: AC
  Administered 2016-03-23: 1000 mL via INTRAVENOUS

## 2016-03-22 MED ORDER — OXYCODONE-ACETAMINOPHEN 5-325 MG PO TABS
1.0000 | ORAL_TABLET | ORAL | Status: DC | PRN
  Administered 2016-03-22: 1 via ORAL

## 2016-03-22 MED ORDER — METOCLOPRAMIDE HCL 5 MG/ML IJ SOLN
10.0000 mg | Freq: Once | INTRAMUSCULAR | Status: AC
  Administered 2016-03-23: 10 mg via INTRAVENOUS
  Filled 2016-03-22: qty 2

## 2016-03-22 NOTE — ED Provider Notes (Signed)
CSN: [id number]     Arrival date & time 03/22/16  1443 History   First MD Initiated Contact with Patient 03/22/16 1550     Chief Complaint  Patient presents with  . Dizziness  . Headache   (Consider location/radiation/quality/duration/timing/severity/associated sxs/prior Treatment) 60 year old female was feeling miserable due to severe vertigo, headache and nausea. Vertigo started less intense about 4 weeks ago. Gradual onset and has been intermittent but more on than off. AProximally one week ago the part ago became increasingly worse, associated with nausea. The headache over her right eye developed and this is been more persistent and constant. She is unable to ambulate without assistance, unable to stand without assistance. Symptoms worse with almost any kind of movement or change in body plain positions. Denies any known trauma. Denies problems with vision, speech, hearing, swallowing, problems with orientation, memory or cognition.      Past Medical History:  Diagnosis Date  . Adrenal hyperplasia [facility])    Past Surgical History:  Procedure Laterality Date  . CESAREAN SECTION     No family history on file. Social History  Substance Use Topics  . Smoking status: Never Smoker  . Smokeless tobacco: Never Used  . Alcohol use No   OB History    No data available     Review of Systems  Constitutional: Positive for activity change, appetite change and fatigue. Negative for fever.  HENT: Negative for congestion, drooling, ear discharge, ear pain, facial swelling, postnasal drip, sinus pressure, sore throat and trouble swallowing.   Eyes: Negative for photophobia, discharge, redness and visual disturbance.  Respiratory: Negative.   Cardiovascular: Negative.   Gastrointestinal: Positive for vomiting.  Genitourinary: Negative.   Musculoskeletal: Negative.   Skin: Negative.   Neurological: Positive for dizziness, light-headedness and headaches. Negative for tremors, syncope,  facial asymmetry and speech difficulty.    Allergies  Review of patient's allergies indicates no known allergies.  Home Medications   Prior to Admission medications   Medication Sig Start Date End Date Taking? Authorizing Provider  baclofen (LIORESAL) 10 MG tablet Take 10 mg by mouth every 8 (eight) hours as needed. 02/02/15  Yes Historical Provider, MD  gabapentin (NEURONTIN) 300 MG capsule TAKE 1 BY MOUTH AT BEDTIME X 1 WEEK, THEN 1 BY MOUTH 2 TIMES DAILY X 1 WEEK, THEN 1 BY MOUTH 3 TIMES 02/02/15  Yes Historical Provider, MD  Ginsengs-Royal Jelly (GINSENG COMPLEX/ROYAL JELLY PO) Take 800 mg by mouth 2 (two) times daily.    Historical Provider, MD  hydrocortisone (CORTEF) 10 MG tablet Take 15 mg in am and 10 mg in pm 02/07/16   [name], MD  methocarbamol (ROBAXIN) 500 MG tablet Take 2 tablets (1,000 mg total) by mouth 4 (four) times daily as needed (Pain). Patient not taking: Reported on 03/22/2016 01/26/15   Joni Reining Pisciotta, PA-C  traMADol (ULTRAM) 50 MG tablet Take 1 tablet (50 mg total) by mouth every 6 (six) hours as needed for moderate pain. Patient not taking: Reported on 03/22/2016 01/26/15   Shade Flood, MD  vitamin B-12 (CYANOCOBALAMIN) 500 MCG tablet Take 500 mcg by mouth daily.    Historical Provider, MD  Vitamin D, Ergocalciferol, (DRISDOL) 50000 UNITS CAPS capsule Take 1 capsule (50,000 Units total) by mouth every 7 (seven) days. Patient not taking: Reported on 03/22/2016 02/14/15   [name], MD   Meds Ordered and Administered this Visit   Medications  ondansetron (ZOFRAN-ODT) disintegrating tablet 4 mg (4 mg Oral Given 03/22/16 1619)  BP 102/69 (BP Location: Right Arm)   Pulse 86   Temp 97.9 F (36.6 C) (Oral)   Resp 16   SpO2 97%  No data found.   Physical Exam  Constitutional: She is oriented to person, place, and time. She appears well-developed and well-nourished.  HENT:  Head: Normocephalic and atraumatic.  Eyes: EOM are normal. Pupils are  equal, round, and reactive to light.  Eye testing is limited in that it aggravates her dizziness, headache and nausea. No nystagmus is noticed.  Neck: Normal range of motion. Neck supple.  Cardiovascular: Normal rate.   Pulmonary/Chest: Effort normal.  Musculoskeletal: Normal range of motion. She exhibits no edema.  Neurological: She is alert and oriented to person, place, and time. She has normal strength. She is not disoriented. Gait abnormal. GCS eye subscore is 4. GCS verbal subscore is 5. GCS motor subscore is 6.  Patient is able to stand with assistance. She is unable to stand without holding onto an object. She is unable to ambulate normally, having to take short steps with assistance only. Positive Romberg, finger-to-nose intact but with some slowness and contacting finger during movement.  Skin: Skin is warm and dry. She is not diaphoretic.  Psychiatric: She has a normal mood and affect.  Nursing note and vitals reviewed.   Urgent Care Course   Clinical Course    Procedures (including critical care time)  Labs Review Labs Reviewed - No data to display  Imaging Review No results found.   Visual Acuity Review  Right Eye Distance:   Left Eye Distance:   Bilateral Distance:    Right Eye Near:   Left Eye Near:    Bilateral Near:         MDM   1. Vertigo   2. Nonintractable headache, unspecified chronicity pattern, unspecified headache type   3. Nausea   4. Impaired gait and mobility    Patient with rather severe vertigo associated with persistent headache over the right eye, nausea and mobility impairment. Symptoms have increased over the past week and needs assistance with standing and ambulating. [facility] for additional evaluation and management.    Hayden Rasmussenavid Kiran Lapine, NP 03/22/16 1624    Hayden Rasmussenavid Charon Akamine, NP 03/22/16 [phone]

## 2016-03-22 NOTE — ED Triage Notes (Signed)
Here for dizziness onset 4 weeks associated w/nauseas and HA  A&O x4... NAD

## 2016-03-22 NOTE — ED Provider Notes (Signed)
By signing my name below, I, Rosario Adie, attest that this documentation has been prepared under the direction and in the presence of Kristen N [name], DO. Electronically Signed: Rosario Adie, ED Scribe. 03/22/16. 11:55 PM.  TIME SEEN: 11:47 PM  CHIEF COMPLAINT:  Chief Complaint  Patient presents with  . Headache  . Dizziness   HPI Comments: Tracy Larson is a 60 y.o. female  With h/o CAH who presents to the Emergency Department complaining of constant, room spinning dizziness onset ~4 weeks ago, worsening ~6 days ago. Pt reports ongoing headache for the past ~6 days with associated nausea, photophobia, and subjective numbness in her bilateral hands that is new for her. Pt also states that she has had intermittent episodes of diarrhea with no specific timeframe. Pt was seen in UC today prior to her arrival w/ referral into the ED for further evaluation of her symptoms. Pt additionally notes that she was seen by her Endocrinologist (followed for her hx of Adrenal Hyperplasia) who recently lowered her dosage of Hydrocortisone and took her off of some of her other medications because of her symptoms. She has not been evaluated by a PCP for this problem secondary to not having one. No hx of similar symptoms. Pt has been taking OTC supplements at home with minimal relief of her pain. General movements will exacerbate both her dizziness and headache. She reports she does have photophobia. Has nausea. No hx of similar symptoms. No hx of strokes, DM, HTN, or HLD. No recent head injury. Pt is a non-smoker. Denies vision or hearing changes, focal/unilateral weakness, CP, fevers, SOB, vomiting, hematochezia, melena, or any other associated symptoms. Not on anticoagulation or antiplatelets.   ROS: See HPI Constitutional: no fever  Eyes: no drainage  ENT: no runny nose   Cardiovascular:  no chest pain  Resp: no SOB  GI: no vomiting, melena  GU: no dysuria Integumentary: no rash  Allergy:  no hives  Musculoskeletal: no leg swelling  Neurological: no slurred speech or focal weakness, positive for headaches and dizziness ROS otherwise negative  PAST MEDICAL HISTORY/PAST SURGICAL HISTORY:  Past Medical History:  Diagnosis Date  . Adrenal hyperplasia (HCC)    MEDICATIONS:  Prior to Admission medications   Medication Sig Start Date End Date Taking? Authorizing Provider  baclofen (LIORESAL) 10 MG tablet Take 10 mg by mouth every 8 (eight) hours as needed. 02/02/15   Historical Provider, MD  gabapentin (NEURONTIN) 300 MG capsule TAKE 1 BY MOUTH AT BEDTIME X 1 WEEK, THEN 1 BY MOUTH 2 TIMES DAILY X 1 WEEK, THEN 1 BY MOUTH 3 TIMES 02/02/15   Historical Provider, MD  Ginsengs-Royal Jelly (GINSENG COMPLEX/ROYAL JELLY PO) Take 800 mg by mouth 2 (two) times daily.    Historical Provider, MD  hydrocortisone (CORTEF) 10 MG tablet Take 15 mg in am and 10 mg in pm 02/07/16   [name], MD  methocarbamol (ROBAXIN) 500 MG tablet Take 2 tablets (1,000 mg total) by mouth 4 (four) times daily as needed (Pain). Patient not taking: Reported on 03/22/2016 01/26/15   Joni Reining Pisciotta, PA-C  traMADol (ULTRAM) 50 MG tablet Take 1 tablet (50 mg total) by mouth every 6 (six) hours as needed for moderate pain. Patient not taking: Reported on 03/22/2016 01/26/15   Shade Flood, MD  vitamin B-12 (CYANOCOBALAMIN) 500 MCG tablet Take 500 mcg by mouth daily.    Historical Provider, MD  Vitamin D, Ergocalciferol, (DRISDOL) 50000 UNITS CAPS capsule Take 1 capsule (50,000 Units total)  by mouth every 7 (seven) days. Patient not taking: Reported on 03/22/2016 02/14/15   [name], MD    ALLERGIES:  No Known Allergies  SOCIAL HISTORY:  Social History  Substance Use Topics  . Smoking status: Never Smoker  . Smokeless tobacco: Never Used  . Alcohol use No    FAMILY HISTORY: History reviewed. No pertinent family history.  EXAM:  BP 105/72   Pulse 73   Temp 98.2 F (36.8 C) (Oral)   Resp 18   Ht  5\' 2"  (1.575 m)   Wt 250 lb (113.4 kg)   SpO2 100%   BMI 45.73 kg/m  CONSTITUTIONAL: Alert and oriented and responds appropriately to questions. Well-appearing; well-nourished; obese; afebrile; NAD  HEAD: Normocephalic EYES: Conjunctivae clear, PERRL; EOMI and no nystagmus  ENT: normal nose; no rhinorrhea; moist mucous membranes; TMs are clear bilaterally without erythema, purulence, bulging, perforation, effusion.  No cerumen impaction or sign of foreign body in the external auditory canal. No inflammation, erythema or drainage from the external auditory canal. No signs of mastoiditis. No pain with manipulation of the pinna bilaterally. NECK: Supple, no meningismus, no LAD  CARD: RRR; S1 and S2 appreciated; no murmurs, no clicks, no rubs, no gallops RESP: Normal chest excursion without splinting or tachypnea; breath sounds clear and equal bilaterally; no wheezes, no rhonchi, no rales, no hypoxia or respiratory distress, speaking full sentences ABD/GI: Normal bowel sounds; non-distended; soft, non-tender, no rebound, no guarding, no peritoneal signs BACK:  The back appears normal and is non-tender to palpation, there is no CVA tenderness EXT: Normal ROM in all joints; non-tender to palpation; no edema; normal capillary refill; no cyanosis, no calf tenderness or swelling    SKIN: Normal color for age and race; warm; no rash NEURO: Moves all extremities equally, sensation to light touch intact diffusely, cranial nerves II through XII intact; strength 5/5 in all four extremities  PSYCH: The patient's mood and manner are appropriate. Grooming and personal hygiene are appropriate.  MEDICAL DECISION MAKING: Patient here with vertigo, headache. Denies previous history of the same. Reports she has numbness in both of her hands but her neurologic exam is intact currently. At urgent care patient was unable to ambulate. She does become vertiginous with movement. We did not attempt to ambulate her currently  because she is so symptomatic. Labs, head CT ordered in triage are unremarkable. I feel she will need an MRI of her brain for further evaluation given her new onset headache and vertigo to rule out stroke, mass. We'll treat symptomatically with Toradol, Reglan, Benadryl, IV fluids and meclizine and reassess.  ED PROGRESS: 2:50 AM  Pt reports her headache has significantly improved. Reports no change in her vertigo. We'll give IV Valium. She is able to ambulate with minimal assistance and does not have an ataxic gait. MRI shows no acute abnormality. No infarct or mass.  Given gradual onset of headache for several days, have low suspicion for intracranial hemorrhage. Doubt infectious etiology.  4:10 AM  Pt's urine shows infection. She has received ceftriaxone. Given Valium and reports feeling much better. We will attempt to re-ambulate patient. She does have a slight drop in her blood pressure which may be from IV benzodiazepines. We'll give a second liter of IV fluid. I recommended outpatient follow-up with ENT if she is able to ambulate and will discharge with Zofran, Valium, Fioricet. If she is unable to ambulate, patient may need admission to the hospital.   5:00 AM  Pt is not  orthostatic. She still feels dizzy with ambulate but is able to do so with minimal assistance and a steady gait. I feel she is safe to be discharged home with outpatient PCP information and ENT follow-up. We'll discharge with Zofran, Valium and Fioricet. Discussed return precautions. Discussed with her that she should not be driving while having symptoms of peripheral vertigo. She is comfortable with this plan.   At this time, I do not feel there is any life-threatening condition present. I have reviewed and discussed all results (EKG, imaging, lab, urine as appropriate), exam findings with patient/family. I have reviewed nursing notes and appropriate previous records.  I feel the patient is safe to be discharged home without  further emergent workup and can continue workup as an outpatient as needed. Discussed usual and customary return precautions. Patient/family verbalize understanding and are comfortable with this plan.  Outpatient follow-up has been provided. All questions have been answered.   EKG Interpretation  Date/Time:  Thursday March 22 2016 23:56:50 EDT Ventricular Rate:  76 PR Interval:    QRS Duration: 81 QT Interval:  370 QTC Calculation: 416 R Axis:   53 Text Interpretation:  Sinus rhythm Abnormal R-wave progression, early transition Minimal ST elevation, inferior leads No significant change since last tracing Confirmed by [name],  DO, KRISTEN [phone] on 03/23/2016 12:01:39 AM        I personally performed the services described in the above documentation, which was scribed in my presence. The recorded information above has been reviewed and is accurate.    Layla MawKristen N [name], DO 03/23/16 [phone]

## 2016-03-22 NOTE — ED Notes (Signed)
Pt called the ER main phone line from the waiting room @ 2139, stating that she had been here for 5ish hours and that no one has offered to elevate her main for her headache. Went and spoke w/ Clydie BraunKaren - Consulting civil engineerCharge RN who stated to have [place]adaBrittany O - Triage RN reassess the pt, if pt continues to have a headache then to do a swallow screen since pt did come here for dizziness also and to give pt a percocet. Teodora MediciBrittany O, RN made aware of same. Brought pt to triage and explained the process to pt that was fixing to take place.

## 2016-03-22 NOTE — ED Triage Notes (Signed)
Pt reports having vertigo/dizziness x 4 weeks after her med dosages were changed. Pt reports now also having headache this week, causing nausea. Denies sensitivity to light, vomiting or hx of migraines.

## 2016-03-23 ENCOUNTER — Emergency Department (HOSPITAL_COMMUNITY)

## 2016-03-23 DIAGNOSIS — H81399 Other peripheral vertigo, unspecified ear: Secondary | ICD-10-CM | POA: Diagnosis not present

## 2016-03-23 LAB — URINE MICROSCOPIC-ADD ON

## 2016-03-23 LAB — URINALYSIS, ROUTINE W REFLEX MICROSCOPIC
Glucose, UA: NEGATIVE mg/dL
Ketones, ur: 15 mg/dL — AB
NITRITE: NEGATIVE
Protein, ur: 30 mg/dL — AB
SPECIFIC GRAVITY, URINE: 1.03 (ref 1.005–1.030)
pH: 5.5 (ref 5.0–8.0)

## 2016-03-23 MED ORDER — BUTALBITAL-APAP-CAFFEINE 50-325-40 MG PO TABS: 1.0000 | ORAL_TABLET | Freq: Four times a day (QID) | ORAL | 0 refills | Status: AC | PRN

## 2016-03-23 MED ORDER — SODIUM CHLORIDE 0.9 % IV BOLUS (SEPSIS)
1000.0000 mL | Freq: Once | INTRAVENOUS | Status: AC
  Administered 2016-03-23: 1000 mL via INTRAVENOUS

## 2016-03-23 MED ORDER — DIAZEPAM 5 MG PO TABS: 5.0000 mg | ORAL_TABLET | Freq: Three times a day (TID) | ORAL | 0 refills | Status: DC | PRN

## 2016-03-23 MED ORDER — DEXTROSE 5 % IV SOLN
1.0000 g | Freq: Once | INTRAVENOUS | Status: AC
  Administered 2016-03-23: 1 g via INTRAVENOUS
  Filled 2016-03-23: qty 10

## 2016-03-23 MED ORDER — DIAZEPAM 5 MG/ML IJ SOLN
5.0000 mg | Freq: Once | INTRAMUSCULAR | Status: AC
  Administered 2016-03-23: 5 mg via INTRAVENOUS
  Filled 2016-03-23: qty 2

## 2016-03-23 MED ORDER — ONDANSETRON 4 MG PO TBDP: 4.0000 mg | ORAL_TABLET | Freq: Three times a day (TID) | ORAL | 0 refills | Status: DC | PRN

## 2016-03-23 MED ORDER — CEPHALEXIN 500 MG PO CAPS: 500.0000 mg | ORAL_CAPSULE | Freq: Two times a day (BID) | ORAL | 0 refills | Status: DC

## 2016-03-23 NOTE — ED Notes (Signed)
Pt ambulated in hallway, became very dizzy and needed to sit in wheelchair and be assisted back to room

## 2016-03-23 NOTE — Discharge Instructions (Signed)
To find a primary care or specialty doctor please call [phone] or [phone] to access "[facility] Find a Doctor Service." ° °You may also go on the [facility] website at [email or web address] ° °There are also multiple [facility] practices throughout the [place] that are frequently accepting new patients. You may find a clinic that is close to your home and contact them. ° °[facility] -  °[address] °[place] [address] °[phone] ° °[facility] in [place] (also locations in [place] and [place]) -  °[address] °[place] [address] [phone] ° °[facility] -  °[address] ° [place] [address] [phone] ° ° °

## 2016-03-23 NOTE — ED Notes (Signed)
Pt to MRI at this time.

## 2016-03-23 NOTE — ED Notes (Signed)
Pt ambulated down hallway and back to room, pt steady on her feet but pt states she still feels dizzy, feels like the ground is uneven.,

## 2016-03-24 LAB — URINE CULTURE

## 2016-03-30 ENCOUNTER — Encounter (HOSPITAL_COMMUNITY): Payer: Self-pay | Admitting: Emergency Medicine

## 2016-03-30 ENCOUNTER — Ambulatory Visit (HOSPITAL_COMMUNITY)
Admission: EM | Admit: 2016-03-30 | Discharge: 2016-03-30 | Disposition: A | Discharge status: Home / Self Care | Attending: Family Medicine | Admitting: Family Medicine

## 2016-03-30 DIAGNOSIS — Z760 Encounter for issue of repeat prescription: Secondary | ICD-10-CM

## 2016-03-30 DIAGNOSIS — N390 Urinary tract infection, site not specified: Secondary | ICD-10-CM

## 2016-03-30 LAB — POCT URINALYSIS DIP (DEVICE)
BILIRUBIN URINE: NEGATIVE
Glucose, UA: NEGATIVE mg/dL
KETONES UR: NEGATIVE mg/dL
Nitrite: NEGATIVE
PH: 6 (ref 5.0–8.0)
Protein, ur: NEGATIVE mg/dL
SPECIFIC GRAVITY, URINE: 1.01 (ref 1.005–1.030)
Urobilinogen, UA: 0.2 mg/dL (ref 0.0–1.0)

## 2016-03-30 LAB — POCT PREGNANCY, URINE: Preg Test, Ur: NEGATIVE

## 2016-03-30 MED ORDER — CEPHALEXIN 500 MG PO CAPS: 500.0000 mg | ORAL_CAPSULE | Freq: Two times a day (BID) | ORAL | 0 refills | Status: DC

## 2016-03-30 NOTE — Discharge Instructions (Signed)
Take full dose of medicaiton with food. Return to have urine check after completed dose.

## 2016-03-30 NOTE — ED Provider Notes (Signed)
CSN: [id number]     Arrival date & time 03/30/16  1354 History   First MD Initiated Contact with Patient 03/30/16 1507     Chief Complaint  Patient presents with  . Medication Refill   (Consider location/radiation/quality/duration/timing/severity/associated sxs/prior Treatment) Pt is being treated for a  UTI seen in the ER on 9/22. She still is having some frequency but states she read the medication wrong and did not take the correct dose took one q6hrs vs BID. Pt states that she feels like she is getting better but would like more abx until she can get in with her pcp next month. Denies any n/v/d no fevers. No cv tenderness. No blood in urine        Past Medical History:  Diagnosis Date  . Adrenal hyperplasia [facility](HCC)    Past Surgical History:  Procedure Laterality Date  . CESAREAN SECTION     No family history on file. Social History  Substance Use Topics  . Smoking status: Never Smoker  . Smokeless tobacco: Never Used  . Alcohol use No   OB History    No data available     Review of Systems  Constitutional: Negative.   Respiratory: Negative.   Cardiovascular: Negative.   Gastrointestinal: Negative.   Genitourinary: Positive for frequency.  Skin: Negative.   Neurological: Negative.     Allergies  Review of patient's allergies indicates no known allergies.  Home Medications   Prior to Admission medications   Medication Sig Start Date End Date Taking? Authorizing Provider  baclofen (LIORESAL) 10 MG tablet Take 10 mg by mouth every 8 (eight) hours as needed. 02/02/15   Historical Provider, MD  butalbital-acetaminophen-caffeine (FIORICET, ESGIC) 50-325-40 MG tablet Take 1-2 tablets by mouth every 6 (six) hours as needed for headache. 03/23/16 03/23/17  Kristen N [name], DO  celecoxib (CELEBREX) 200 MG capsule Take 200 mg by mouth daily.    Historical Provider, MD  cephALEXin (KEFLEX) 500 MG capsule Take 1 capsule (500 mg total) by mouth 2 (two) times daily. 03/30/16    Tobi BastosMelanie A Kimbely Whiteaker, NP  diazepam (VALIUM) 5 MG tablet Take 1 tablet (5 mg total) by mouth every 8 (eight) hours as needed (dizziness). 03/23/16   Kristen N [name], DO  gabapentin (NEURONTIN) 300 MG capsule Take 300 mg by mouth 4 (four) times daily.    Historical Provider, MD  Ginsengs-Royal Jelly (GINSENG COMPLEX/ROYAL JELLY PO) Take 800 mg by mouth 2 (two) times daily.    Historical Provider, MD  hydrocortisone (CORTEF) 10 MG tablet Take 15 mg in am and 10 mg in pm Patient taking differently: Take 10-15 mg by mouth See admin instructions. Take 15 mg in am and 10 mg in pm 02/07/16   Carlus Pavlovristina Gherghe, MD  ondansetron (ZOFRAN ODT) 4 MG disintegrating tablet Take 1 tablet (4 mg total) by mouth every 8 (eight) hours as needed for nausea or vomiting. 03/23/16   Layla MawKristen N [name], DO  vitamin B-12 (CYANOCOBALAMIN) 500 MCG tablet Take 500 mcg by mouth daily.    Historical Provider, MD   Meds Ordered and Administered this Visit  Medications - No data to display  BP 104/70 (BP Location: Left Arm)   Pulse 86   Temp 98.6 F (37 C) (Oral)   Resp 12   SpO2 100%  No data found.   Physical Exam  Constitutional: She appears well-developed and well-nourished.  Cardiovascular: Normal rate and regular rhythm.   Pulmonary/Chest: Effort normal and breath sounds normal.  Abdominal: Soft. Bowel  sounds are normal.  Genitourinary:  Genitourinary Comments: Frequency minimal, denies any blood, no cv tenderness,   Skin: Skin is warm and dry.    Urgent Care Course   Clinical Course    Procedures (including critical care time)  Labs Review Labs Reviewed  POCT URINALYSIS DIP (DEVICE) - Abnormal; Notable for the following:       Result Value   Hgb urine dipstick SMALL (*)    Leukocytes, UA TRACE (*)    All other components within normal limits  POCT PREGNANCY, URINE    Imaging Review No results found.           MDM   1. Medication refill   2. Urinary tract infection without hematuria, site  unspecified    Take full dose of abx with food  Will need to follow up after dose complete to ensure infection gone.  Drink clear fluids.      Tobi Bastos, NP 03/30/16 1620

## 2016-03-30 NOTE — ED Triage Notes (Signed)
Here for medication refill States she feels as if she still has an uti States she is having burning sensation

## 2016-04-11 ENCOUNTER — Emergency Department (HOSPITAL_COMMUNITY)
Admission: EM | Admit: 2016-04-11 | Discharge: 2016-04-11 | Disposition: A | Discharge status: Home / Self Care | Attending: Emergency Medicine | Admitting: Emergency Medicine

## 2016-04-11 ENCOUNTER — Encounter (HOSPITAL_COMMUNITY): Payer: Self-pay | Admitting: Emergency Medicine

## 2016-04-11 DIAGNOSIS — R42 Dizziness and giddiness: Secondary | ICD-10-CM | POA: Diagnosis not present

## 2016-04-11 MED ORDER — MECLIZINE HCL 25 MG PO TABS: 25.0000 mg | ORAL_TABLET | Freq: Three times a day (TID) | ORAL | 0 refills | Status: DC | PRN

## 2016-04-11 NOTE — ED Provider Notes (Signed)
[facility]-EMERGENCY DEPT Provider Note   CSN: [id number] Arrival date & time: 04/11/16  1135     History   Chief Complaint Chief Complaint  Patient presents with  . inner ear issue  . Dizziness    HPI Tracy Larson is a 60 y.o. female.  HPI Patient presents with vertigo. Feeling of spinning. Has had episodes of this in the past. His had MRI. States began again today. Also states her eyes had some pinkeye around a week ago so she put hand sanitizer in am. Patient was counseled this is not a good idea. Some occasional tinnitus. No localizing numbness or weakness. States it is worse when she stands up. It is clearly the room spinning and not lightheadedness or dizziness.   Past Medical History:  Diagnosis Date  . Adrenal hyperplasia [facility](HCC)     Patient Active Problem List   Diagnosis Date Noted  . Vitamin D deficiency 02/14/2016  . Long term current use of systemic steroids 02/10/2015  . Osteopenia 02/10/2015  . Notalgia   . Bilateral thoracic back pain   . Absence of bladder continence   . Fecal incontinence   . CAH (congenital adrenal hyperplasia)   . Back pain 11/27/2014  . Morbid obesity (HCC) 11/08/2014  . Arthritis 06/04/2014    Past Surgical History:  Procedure Laterality Date  . CESAREAN SECTION      OB History    No data available       Home Medications    Prior to Admission medications   Medication Sig Start Date End Date Taking? Authorizing Provider  baclofen (LIORESAL) 10 MG tablet Take 10 mg by mouth every 8 (eight) hours as needed. 02/02/15   Historical Provider, MD  butalbital-acetaminophen-caffeine (FIORICET, ESGIC) 50-325-40 MG tablet Take 1-2 tablets by mouth every 6 (six) hours as needed for headache. 03/23/16 03/23/17  Kristen N [name], DO  celecoxib (CELEBREX) 200 MG capsule Take 200 mg by mouth daily.    Historical Provider, MD  cephALEXin (KEFLEX) 500 MG capsule Take 1 capsule (500 mg total) by mouth 2 (two) times daily. 03/30/16   Tobi BastosMelanie A  Mitchell, NP  diazepam (VALIUM) 5 MG tablet Take 1 tablet (5 mg total) by mouth every 8 (eight) hours as needed (dizziness). 03/23/16   Kristen N [name], DO  gabapentin (NEURONTIN) 300 MG capsule Take 300 mg by mouth 4 (four) times daily.    Historical Provider, MD  Ginsengs-Royal Jelly (GINSENG COMPLEX/ROYAL JELLY PO) Take 800 mg by mouth 2 (two) times daily.    Historical Provider, MD  hydrocortisone (CORTEF) 10 MG tablet Take 15 mg in am and 10 mg in pm Patient taking differently: Take 10-15 mg by mouth See admin instructions. Take 15 mg in am and 10 mg in pm 02/07/16   Carlus Pavlovristina Gherghe, MD  meclizine (ANTIVERT) 25 MG tablet Take 1 tablet (25 mg total) by mouth 3 (three) times daily as needed for dizziness. 04/11/16   Benjiman CoreNathan Jerlyn Pain, MD  ondansetron (ZOFRAN ODT) 4 MG disintegrating tablet Take 1 tablet (4 mg total) by mouth every 8 (eight) hours as needed for nausea or vomiting. 03/23/16   Layla MawKristen N [name], DO  vitamin B-12 (CYANOCOBALAMIN) 500 MCG tablet Take 500 mcg by mouth daily.    Historical Provider, MD    Family History No family history on file.  Social History Social History  Substance Use Topics  . Smoking status: Never Smoker  . Smokeless tobacco: Never Used  . Alcohol use No     Allergies  Review of patient's allergies indicates no known allergies.   Review of Systems Review of Systems  Constitutional: Negative for appetite change.  HENT: Positive for tinnitus. Negative for congestion, hearing loss, mouth sores, nosebleeds and trouble swallowing.   Eyes: Positive for redness.  Respiratory: Negative for shortness of breath.   Cardiovascular: Negative for chest pain.  Gastrointestinal: Negative for abdominal pain.  Endocrine: Negative for polyuria.  Genitourinary: Negative for flank pain.  Musculoskeletal: Negative for gait problem.  Skin: Negative for wound.  Neurological: Negative for light-headedness.  Psychiatric/Behavioral: Negative for agitation.      Physical Exam Updated Vital Signs BP 113/74 (BP Location: Right Wrist)   Pulse 71   Temp 98.5 F (36.9 C) (Oral)   Resp 16   Ht 5\' 2"  (1.575 m)   Wt 250 lb (113.4 kg)   SpO2 99%   BMI 45.73 kg/m   Physical Exam  Constitutional: She appears well-developed.  Eyes:  Mild nystagmus, particularly with gaze to left or right.  Neck: Neck supple.  Cardiovascular: Normal rate.   Pulmonary/Chest: Effort normal.  Abdominal: Soft.  Musculoskeletal: Normal range of motion.  Neurological: She is alert.  Finger-nose intact bilaterally.  Skin: Skin is warm. Capillary refill takes less than 2 seconds.     ED Treatments / Results  Labs (all labs ordered are listed, but only abnormal results are displayed) Labs Reviewed - No data to display  EKG  EKG Interpretation None       Radiology No results found.  Procedures Procedures (including critical care time)  Medications Ordered in ED Medications - No data to display   Initial Impression / Assessment and Plan / ED Course  I have reviewed the triage vital signs and the nursing notes.  Pertinent labs & imaging results that were available during my care of the patient were reviewed by me and considered in my medical decision making (see chart for details).  Clinical Course    Patient with vertigo. History of same. Has had previous MRI. Worse with standing but is not orthostatic. Will give short course of Antivert and have ENT follow-up. Patient was instructed not to put hand sanitizer in her eyes.  Final Clinical Impressions(s) / ED Diagnoses   Final diagnoses:  Vertigo    New Prescriptions New Prescriptions   MECLIZINE (ANTIVERT) 25 MG TABLET    Take 1 tablet (25 mg total) by mouth 3 (three) times daily as needed for dizziness.     [name], MD 04/11/16 [phone]

## 2016-04-11 NOTE — ED Triage Notes (Signed)
Pt was seen 2 weeks ago for similar symptoms, having dizziness, states it is my inner ear. States had put hand sanitizer in both eyes to get rid of pink eye-- now thinks that "germ " has gone into inner ear.

## 2016-06-28 ENCOUNTER — Other Ambulatory Visit: Payer: Self-pay | Admitting: Sports Medicine

## 2016-07-03 NOTE — Telephone Encounter (Signed)
Forwarded Rx refill request. Thank You 

## 2016-07-11 ENCOUNTER — Encounter (INDEPENDENT_AMBULATORY_CARE_PROVIDER_SITE_OTHER): Payer: Self-pay | Admitting: Radiology

## 2016-07-11 ENCOUNTER — Telehealth (INDEPENDENT_AMBULATORY_CARE_PROVIDER_SITE_OTHER): Payer: Self-pay | Admitting: Orthopaedic Surgery

## 2016-07-11 NOTE — Telephone Encounter (Signed)
Ok thanks. Let her know if back gets worse we can re-evaluate her . Thanks .

## 2016-07-11 NOTE — Telephone Encounter (Signed)
I left voicemail for patient. Note up front for pick up.

## 2016-07-11 NOTE — Telephone Encounter (Signed)
Please advise 

## 2016-07-11 NOTE — Telephone Encounter (Signed)
Pt is a rigby pt but she has also seen yates and wants to know if dr. Ophelia CharterYates can take care of this...  She's having trouble walking and she said her work is asking for a note stating she cannot walk anymore than  200 yards so she can get a closer parking space. Her primary care Dr. Catalina Pizzaold her to request from her orthopedist.  [phone]

## 2016-08-04 IMAGING — CR DG LUMBAR SPINE COMPLETE 4+V
5 series · 5 of 5 positions shown · non-contrast
Comparison: CT abdomen and pelvis 11/10/2014

CLINICAL DATA: Midline low back pain.

EXAM:
LUMBAR SPINE - COMPLETE 4+ VIEW

[AP (1 of 2)]
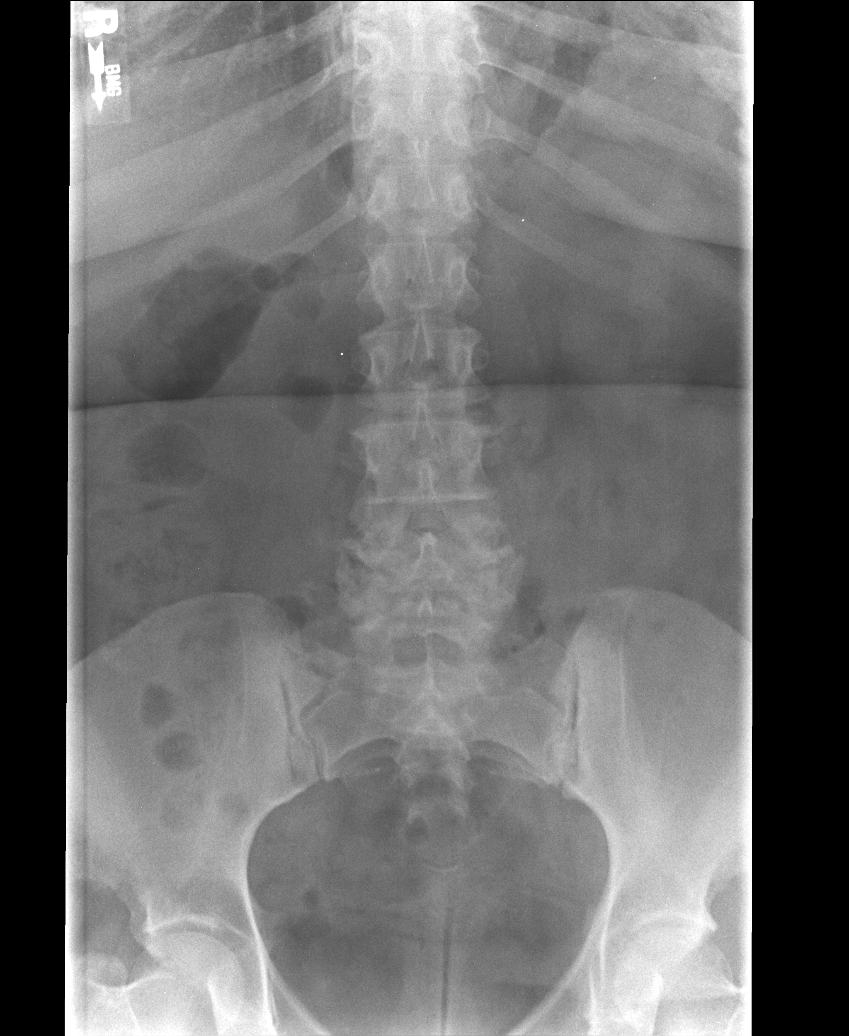

[AP (2 of 2)]
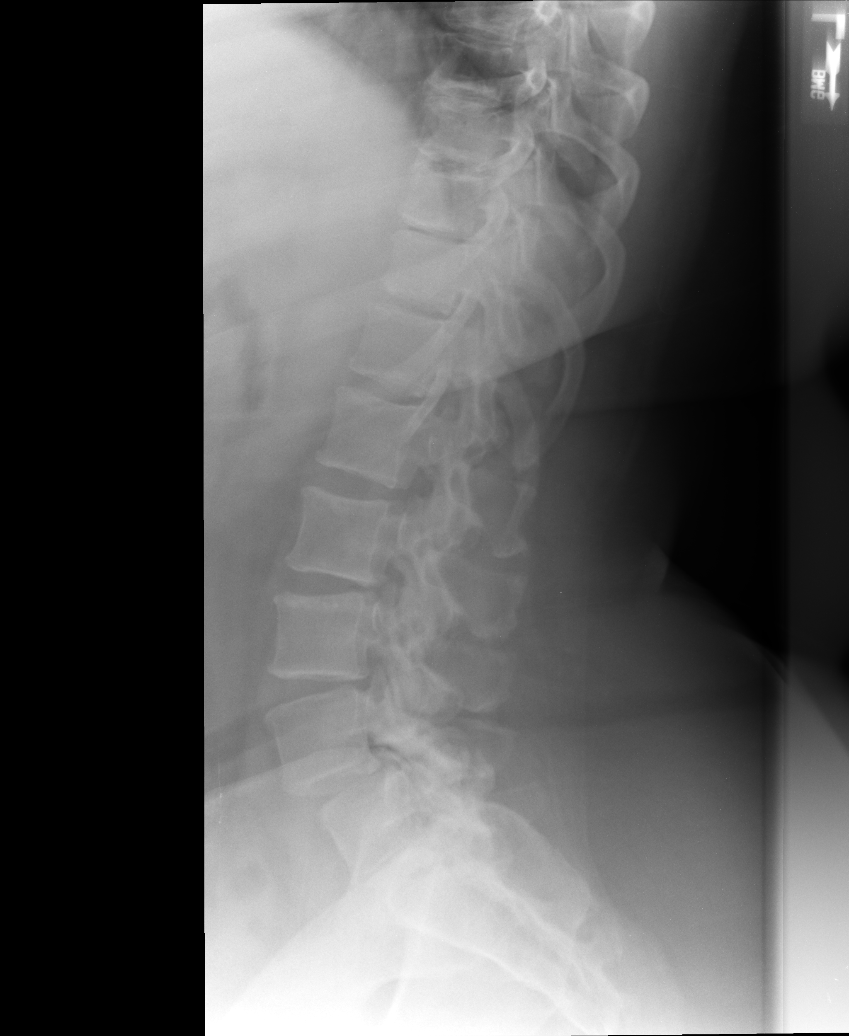

[l5 s1]
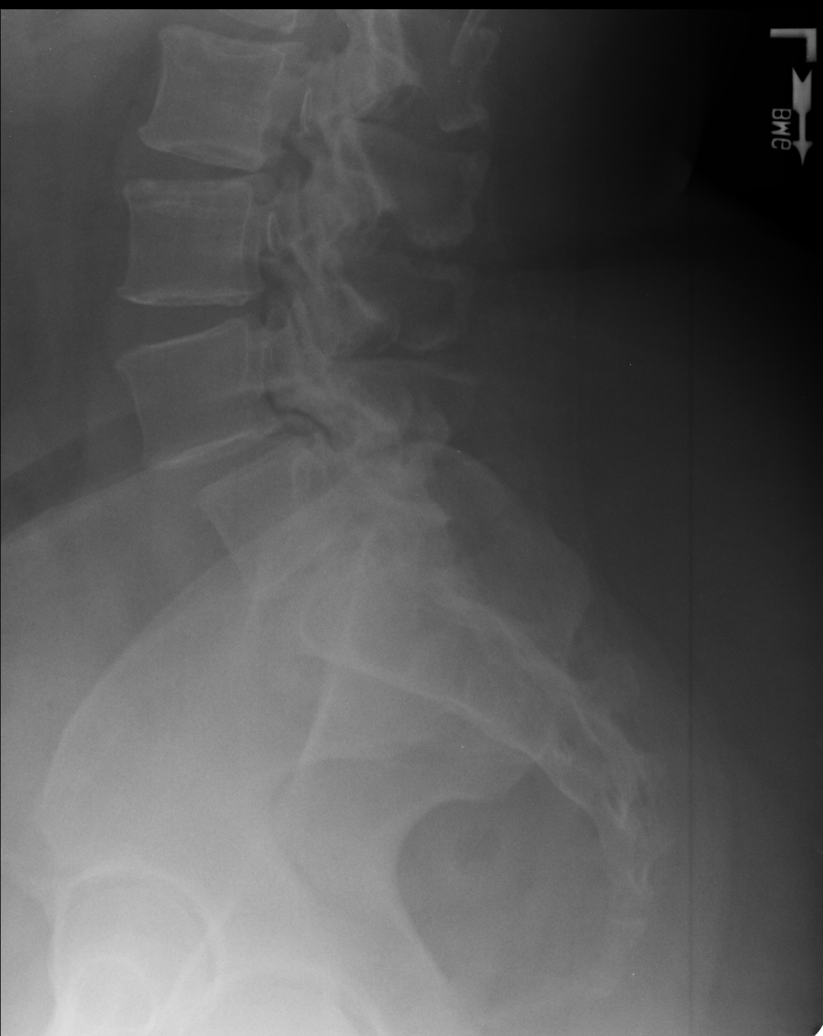

[lpo]
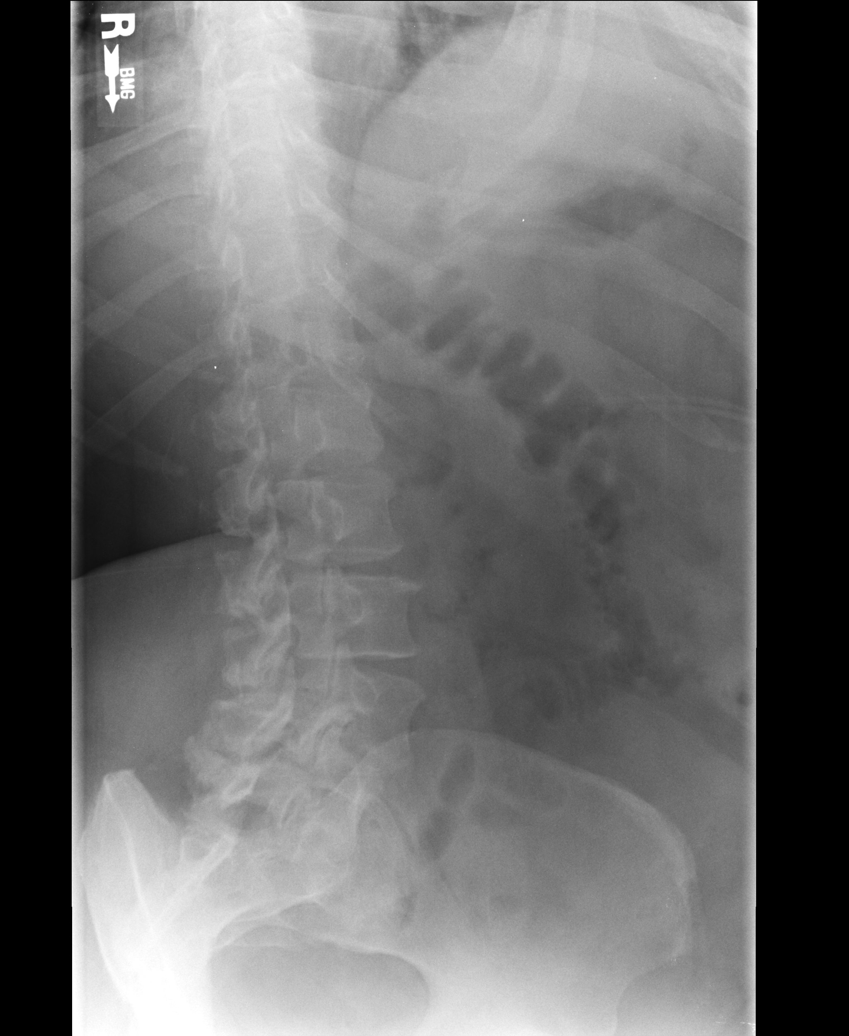

[rpo]
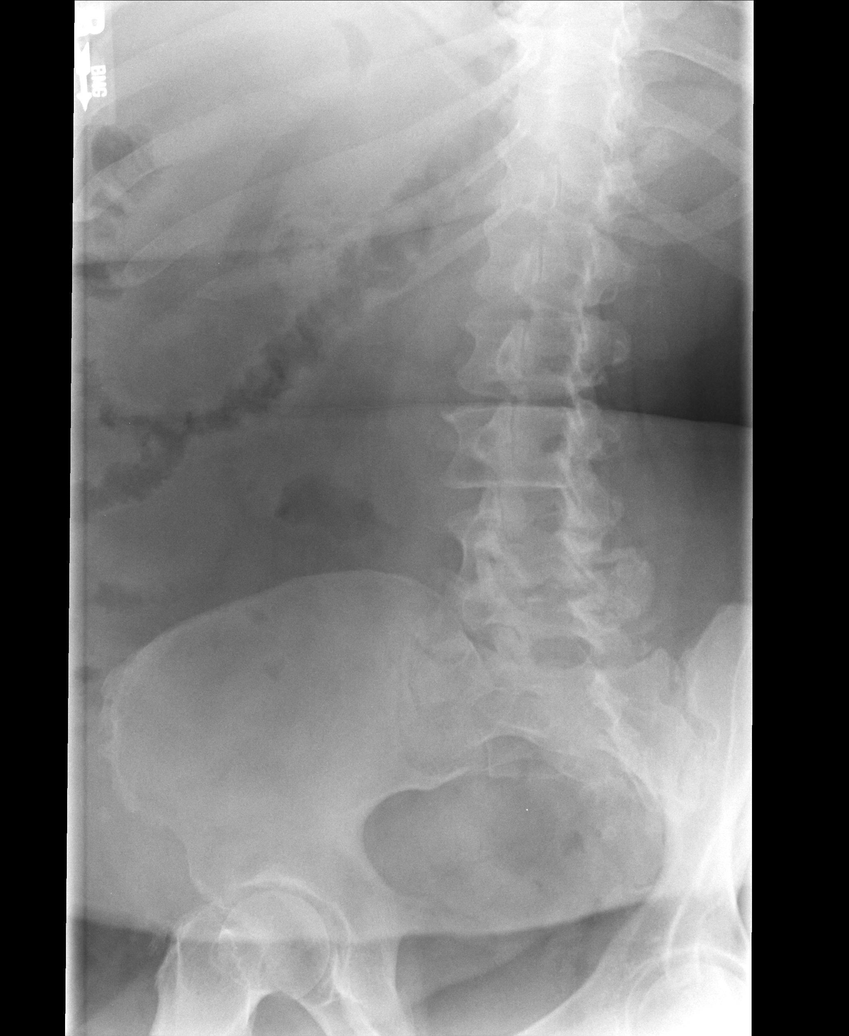

[5 of 5 positions shown; findings below may reference images not displayed]

FINDINGS: There are 5 non rib-bearing lumbar type vertebral bodies. Grade 1
anterolisthesis is again seen of L4 on L5, likely degenerative and
facet mediated given severe facet arthrosis at this level. No pars
defects are identified. Mild L5-S1 facet arthrosis is noted. There
is moderate disc space narrowing at L4-5 and L5-S1 with mild
narrowing at L2-3 and L3-4. Asymmetric, mildly asymmetric
degenerative changes are noted involving the left SI joint. No lytic
or blastic osseous lesion is seen. No soft tissue abnormality is
identified.
IMPRESSION: Multilevel lumbar disc and facet degeneration, greatest at L4-5
where there is severe facet arthrosis grade 1 anterolisthesis.

## 2016-08-04 IMAGING — CR DG THORACIC SPINE 3V
3 series · 3 of 3 positions shown · non-contrast
Comparison: None.

CLINICAL DATA: Midline thoracic back pain.

EXAM:
THORACIC SPINE - 2 VIEW + SWIMMERS

[AP]
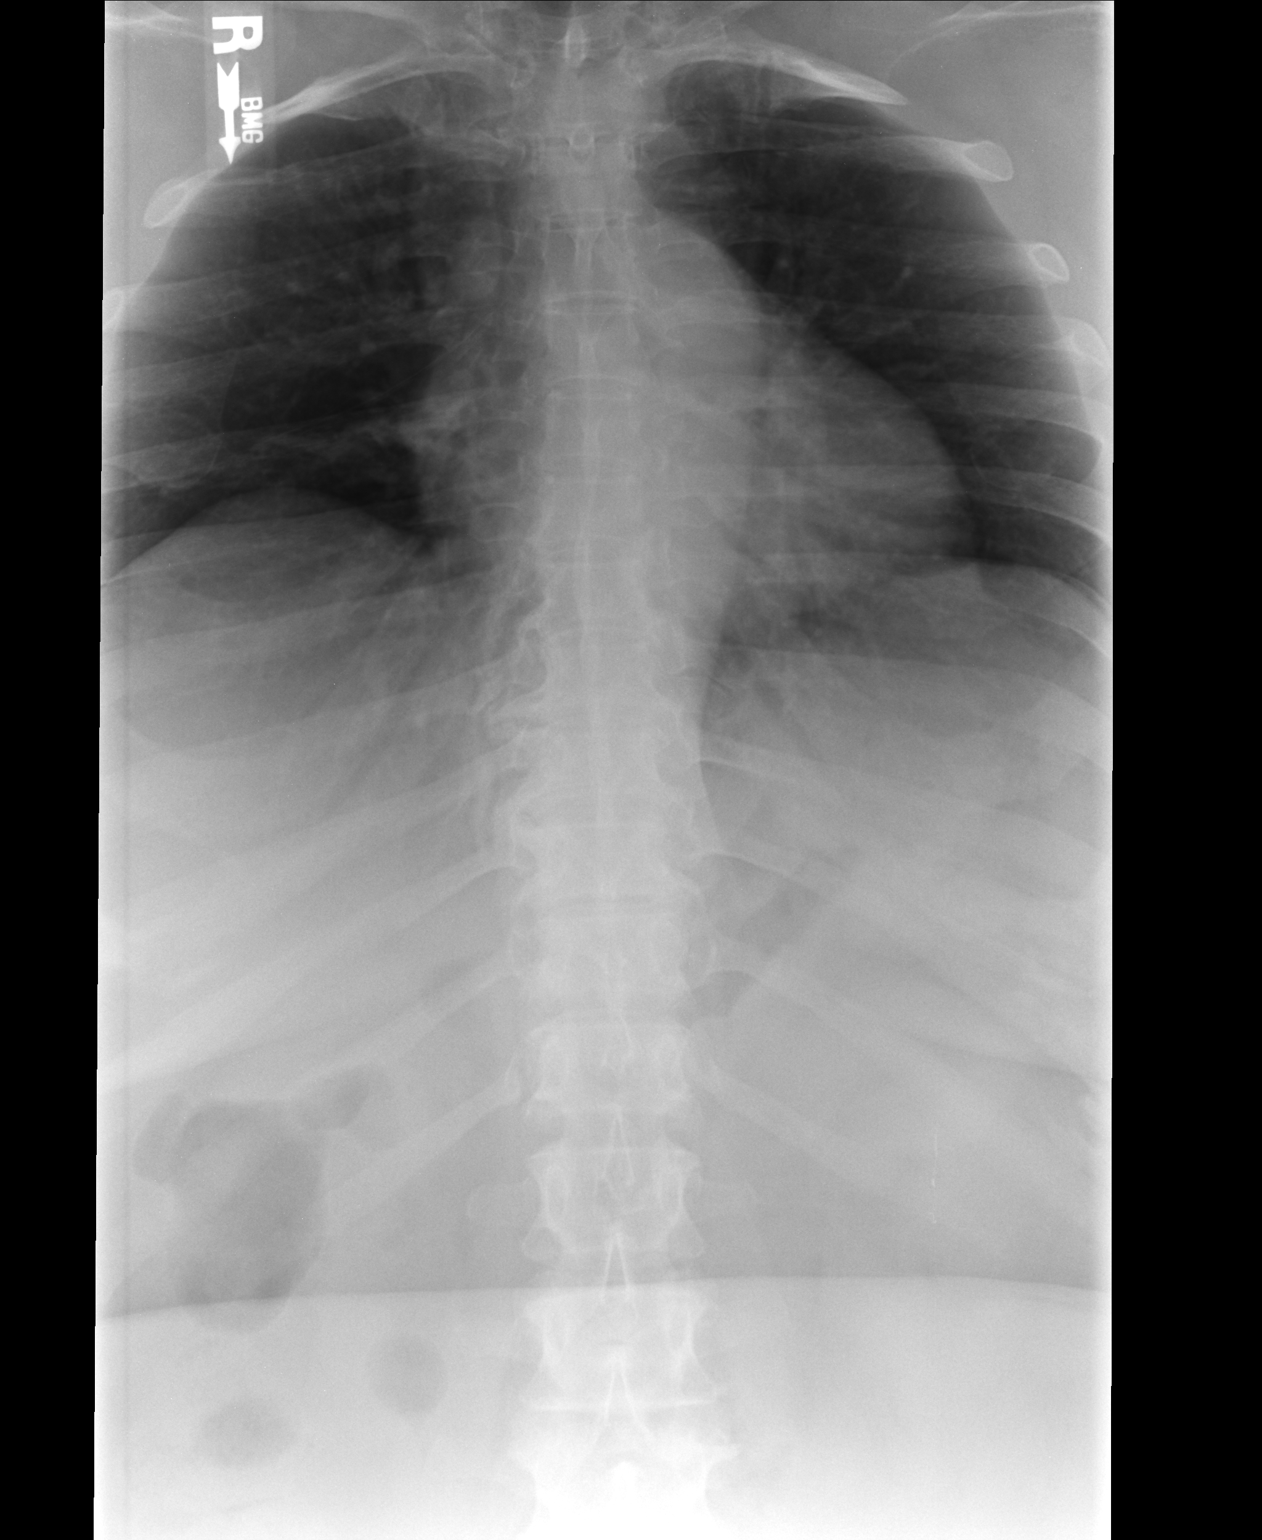

[lateral]
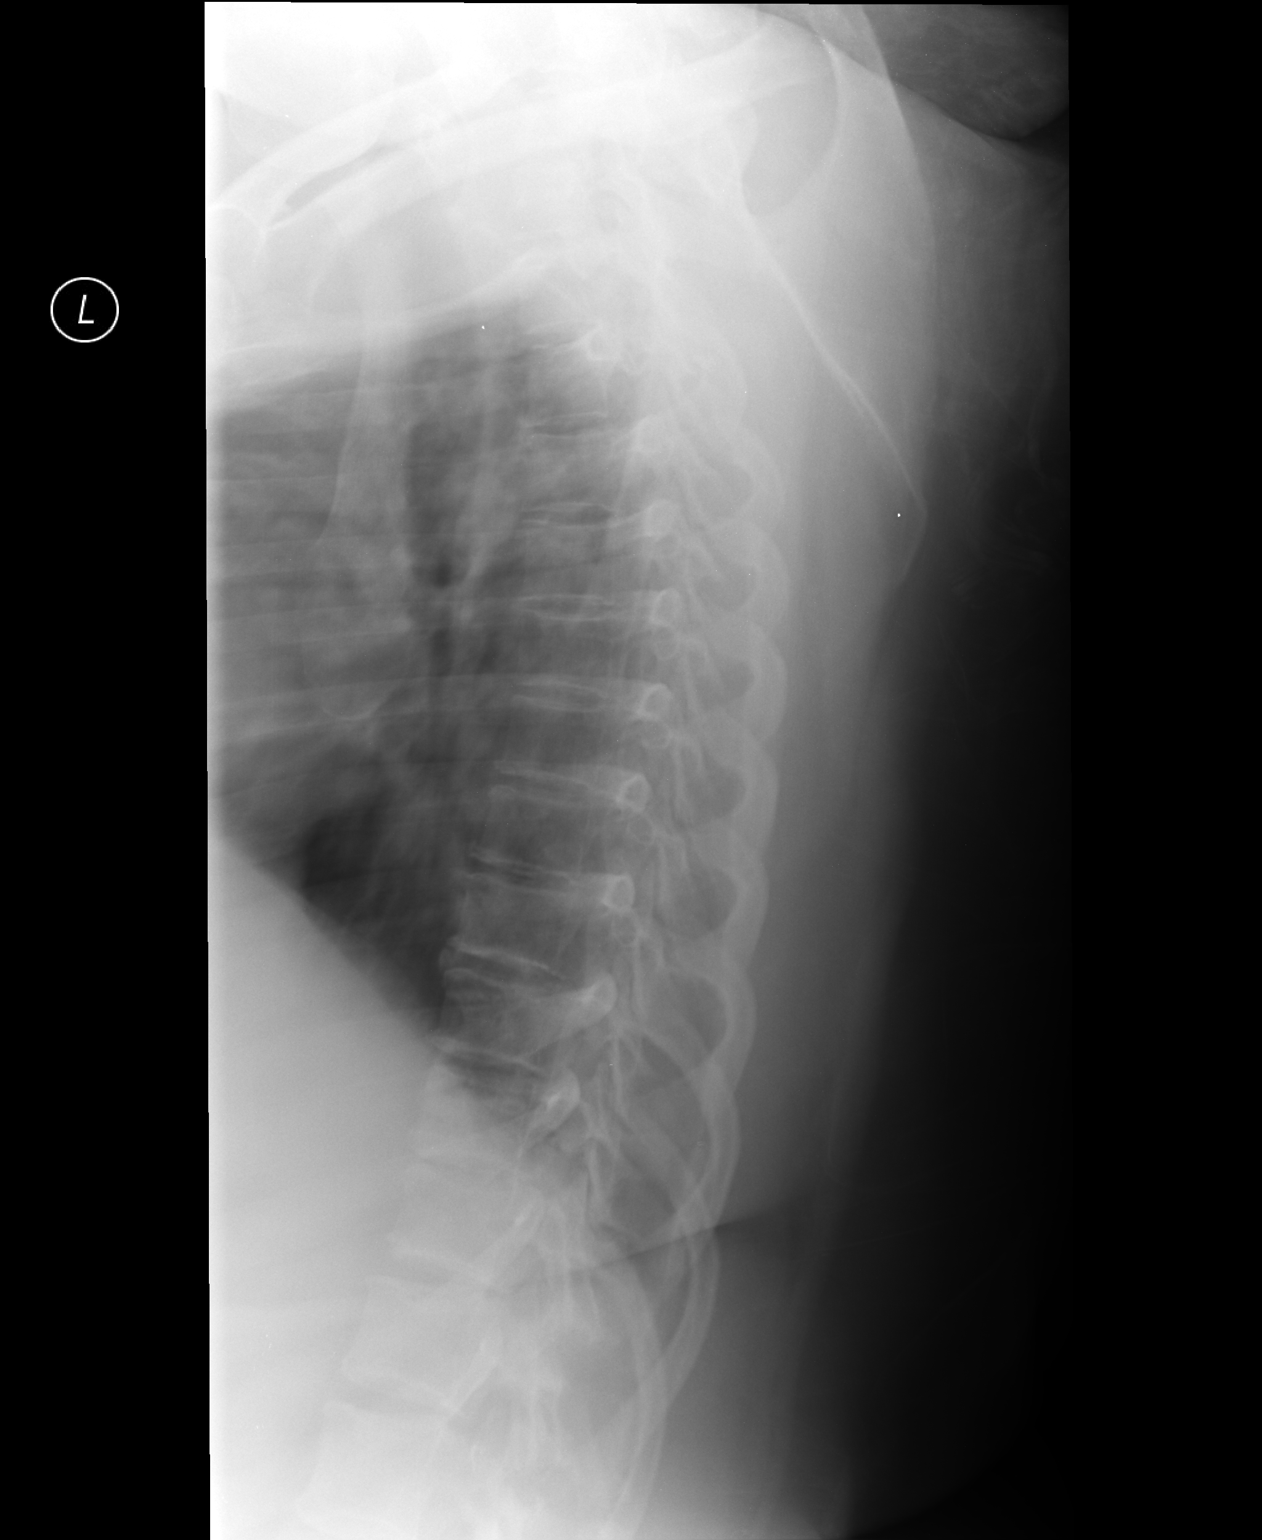

[swimmers]
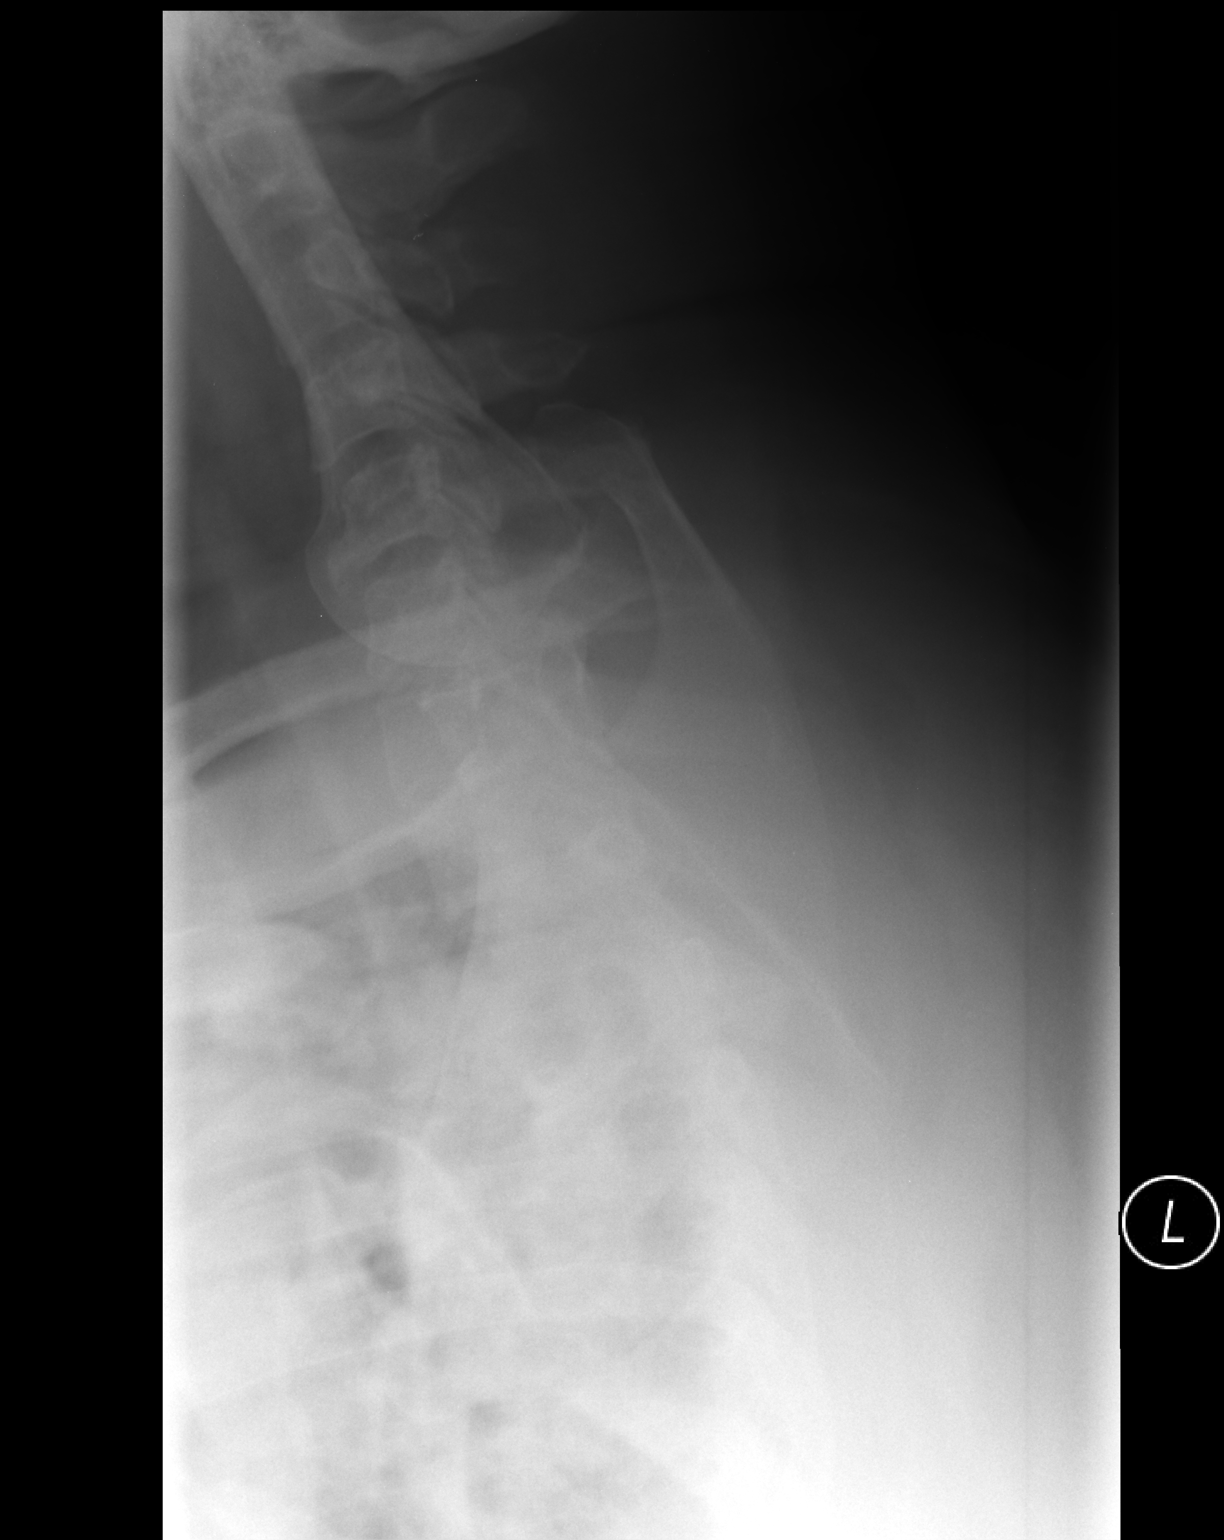

[3 of 3 positions shown; findings below may reference images not displayed]

FINDINGS: There are 12 pairs of ribs. There is mild straightening of the
normal thoracic kyphosis. There is no listhesis. Vertebral body
heights are preserved without evidence of compression fracture.
Moderate endplate osteophytosis is present in the lower thoracic
spine. The no destructive osseous lesion is identified. Visualized
lungs are grossly clear.
IMPRESSION: Lower thoracic spondylosis.  No acute osseous abnormality.

## 2016-12-04 ENCOUNTER — Ambulatory Visit (INDEPENDENT_AMBULATORY_CARE_PROVIDER_SITE_OTHER): Payer: Self-pay | Admitting: Specialist

## 2017-04-19 ENCOUNTER — Ambulatory Visit (INDEPENDENT_AMBULATORY_CARE_PROVIDER_SITE_OTHER)

## 2017-04-19 ENCOUNTER — Encounter: Payer: Self-pay | Admitting: Family Medicine

## 2017-04-19 ENCOUNTER — Ambulatory Visit (INDEPENDENT_AMBULATORY_CARE_PROVIDER_SITE_OTHER): Admitting: Family Medicine

## 2017-04-19 VITALS — BP 110/72 | HR 81 | Temp 98.0°F | Resp 16 | Ht 60.24 in | Wt 223.0 lb

## 2017-04-19 DIAGNOSIS — G8929 Other chronic pain: Secondary | ICD-10-CM

## 2017-04-19 DIAGNOSIS — Z7952 Long term (current) use of systemic steroids: Secondary | ICD-10-CM | POA: Diagnosis not present

## 2017-04-19 DIAGNOSIS — M25572 Pain in left ankle and joints of left foot: Secondary | ICD-10-CM

## 2017-04-19 NOTE — Patient Instructions (Addendum)
Your last bone density tests appeared normal, but that was 5 years ago. It does appear that your endocrinologist did order a bone density in 2016, so would discuss with their office where you need to go to have that performed or whether they need to enter a new order.  For ankle pain, I do not think that is due to broken or thin bones. It sounds like possible arthritis, but may be better with some home exercises. See those below and pick 2 different exercises each day. Tylenol over-the-counter is fine for arthritis type pain in the feet, ankles, or if you are having some arthritis hand pain. Please follow-up in the next few weeks to discuss those other areas of pain further, sooner if worse. If any concerns on your xray, I will let you know.   If any return of knee symptoms, please return for recheck.   Ankle Exercises Ask your health care provider which exercises are safe for you. Do exercises exactly as told by your health care provider and adjust them as directed. It is normal to feel mild stretching, pulling, tightness, or discomfort as you do these exercises, but you should stop right away if you feel sudden pain or your pain gets worse. Do not begin these exercises until told by your health care provider. Stretching and range of motion exercises These exercises warm up your muscles and joints and improve the movement and flexibility of your ankle. These exercises also help to relieve pain, numbness, and tingling. Exercise A: Dorsiflexion/Plantar Flexion  1. Sit with your __________ knee straight or bent. Do not rest your foot on anything. 2. Flex your __________ ankle to tilt the top of your foot toward your shin. 3. Hold this position for __________ seconds. 4. Point your toes downward to tilt the top of your foot away from your shin. 5. Hold this position for __________ seconds. Repeat __________ times. Complete this exercise __________ times a day. Exercise B: Ankle Alphabet  1. Sit with  your __________ foot supported at your lower leg. ? Do not rest your foot on anything. ? Make sure your foot has room to move freely. 2. Think of your __________ foot as a paintbrush, and move your foot to trace each letter of the alphabet in the air. Keep your hip and knee still while you trace. Make the letters as large as you can without increasing any discomfort. 3. Trace every letter from A to Z. Repeat __________ times. Complete this exercise __________ times a day. Exercise C: Ankle Dorsiflexion, Passive 1. Sit on a chair that is placed on a non-carpeted surface. 2. Place your __________ foot on the floor, directly under your __________ knee. Extend your __________ leg for support. 3. Keeping your heel down, slide your __________ foot back toward the chair until you feel a stretch at your ankle or calf. If you do not feel a stretch, slide your buttocks forward to the edge of the chair. 4. Hold this stretch for __________ seconds. Repeat __________ times. Complete this stretch __________ times a day. Strengthening exercises These exercises build strength and endurance in your ankle. Endurance is the ability to use your muscles for a long time, even after they get tired. Exercise D: Dorsiflexors  1. Secure a rubber exercise band or tube to an object, such as a table leg, that will stay still when the band is pulled. Secure the other end around your __________ foot. 2. Sit on the floor, facing the object with your __________  leg extended. The band or tube should be slightly tense when your foot is relaxed. 3. Slowly flex your __________ ankle and toes to bring your foot toward you. 4. Hold this position for __________ seconds. 5. Slowly return your foot to the starting position, controlling the band as you do that. Repeat __________ times. Complete this exercise __________ times a day. Exercise E: Plantar Flexors  1. Sit on the floor with your __________ leg extended. 2. Loop a rubber  exercise band or tube around the ball of your __________ foot. The ball of your foot is on the walking surface, right under your toes. The band or tube should be slightly tense when your foot is relaxed. 3. Slowly point your toes downward, pushing them away from you. 4. Hold this position for __________ seconds. 5. Slowly release the tension in the band or tube, controlling smoothly until your foot is back in the starting position. Repeat __________ times. Complete this exercise __________ times a day. Exercise F: Towel Curls  1. Sit in a chair on a non-carpeted surface, and put your feet on the floor. 2. Place a towel in front of your feet. If told by your health care provider, add __________ to the end of the towel. 3. Keeping your heel on the floor, put your __________ foot on the towel. 4. Pull the towel toward you by grabbing the towel with your toes and curling them under. Keep your heel on the floor. 5. Let your toes relax. 6. Grab the towel again. Keep going until the towel is completely underneath your foot. Repeat __________ times. Complete this exercise __________ times a day. Exercise G: Heel Raise ( Plantar Flexors, Standing) 1. Stand with your feet shoulder-width apart. 2. Keep your weight spread evenly over the width of your feet while you rise up on your toes. Use a wall or table to steady yourself, but try not to use it for support. 3. If this exercise is too easy, try these options: ? Shift your weight toward your __________ leg until you feel challenged. ? If told by your health care provider, lift your uninjured leg off the floor. 4. Hold this position for __________ seconds. Repeat __________ times. Complete this exercise __________ times a day. Exercise H: Tandem Walking 1. Stand with one foot directly in front of the other. 2. Slowly raise your back foot up, lifting your heel before your toes, and place it directly in front of your other foot. 3. Continue to walk in  this heel-to-toe way for __________ or for as long as told by your health care provider. Have a countertop or wall nearby to use if needed to keep your balance, but try not to hold onto anything for support. Repeat __________ times. Complete this exercises __________ times a day. This information is not intended to replace advice given to you by your health care provider. Make sure you discuss any questions you have with your health care provider. Document Released: 05/02/2005 Document Revised: 02/16/2016 Document Reviewed: 03/06/2015 Elsevier Interactive Patient Education  2018 [employer].   IF you received an x-ray today, you will receive an invoice from [facility]. Please contact [facility] at [phone] with questions or concerns regarding your invoice.   IF you received labwork today, you will receive an invoice from [facility]. Please contact [facility] at [phone] with questions or concerns regarding your invoice.   Our billing staff will not be able to assist you with questions regarding bills from these companies.  You  will be contacted with the lab results as soon as they are available. The fastest way to get your results is to activate your My Chart account. Instructions are located on the last page of this paperwork. If you have not heard from [place] regarding the results in 2 weeks, please contact this office.

## 2017-04-19 NOTE — Progress Notes (Signed)
Subjective:  By signing my name below, I, Tracy Larson, attest that this documentation has been prepared under the direction and in the presence of Tracy Staggers, MD. Electronically Signed: Stann Larson, Scribe. 04/19/2017 , 2:11 PM .  Patient was seen in Room 9 .   Patient ID: Tracy Larson, female    DOB: 10-02-1955, 61 y.o.   MRN: [id number] Chief Complaint  Patient presents with  . Establish Care    pt states she would like to talk about some medication about her bones    HPI Tracy Larson is a 61 y.o. female  Here for follow up and to establish care. I last saw her for low back pain in 2016. She has been on long term steroids due to congenital adrenal hyperplasia. She has been followed by endocrinologist, Dr. [name], in the past with appointment in Aug 2016. She was continued on hydrocortisone 10mg  BID at that time. Her last appointment with Dr. [name] was in Aug 2017, and continued on hydrocortisone 20mg  in the morning and 10mg  at night.   She had previous orders for bone density, but the last one I see is in 2013. At that time, her T-score was -0.1 or normal.   Patient states she's still followed by Dr. [name], and still taking hydrocortisone 20mg  in the morning and 10mg  at night. She hasn't seen her recently, last seen in 2017.   Left foot pain She complains having left foot pain after walking, with pain located under her heel. She states every time she tries to walk for about a mile, she would have left ankle and heel pain. She's been using a massager over the area for relief. She denies taking any medications for this.   Patient Active Problem List   Diagnosis Date Noted  . Vitamin D deficiency 02/14/2016  . Long term current use of systemic steroids 02/10/2015  . Osteopenia 02/10/2015  . Notalgia   . Bilateral thoracic back pain   . Absence of bladder continence   . Fecal incontinence   . CAH (congenital adrenal hyperplasia)   . Back pain 11/27/2014  .  Morbid obesity (HCC) 11/08/2014  . Arthritis 06/04/2014   Past Medical History:  Diagnosis Date  . Adrenal hyperplasia [facility])    Past Surgical History:  Procedure Laterality Date  . CESAREAN SECTION     No Known Allergies Prior to Admission medications   Medication Sig Start Date End Date Taking? Authorizing Provider  baclofen (LIORESAL) 10 MG tablet TAKE 2 TABLETS THREE TIMES A DAY. 07/03/16   Andrena Mews, DO  celecoxib (CELEBREX) 200 MG capsule Take 200 mg by mouth daily.    [provider]  cephALEXin (KEFLEX) 500 MG capsule Take 1 capsule (500 mg total) by mouth 2 (two) times daily. 03/30/16   [name], NP  diazepam (VALIUM) 5 MG tablet Take 1 tablet (5 mg total) by mouth every 8 (eight) hours as needed (dizziness). 03/23/16   [name], [name], DO  gabapentin (NEURONTIN) 300 MG capsule Take 300 mg by mouth 4 (four) times daily.    [provider]  Ginsengs-Royal Jelly (GINSENG COMPLEX/ROYAL JELLY PO) Take 800 mg by mouth 2 (two) times daily.    [provider]  hydrocortisone (CORTEF) 10 MG tablet Take 15 mg in am and 10 mg in pm Patient taking differently: Take 10-15 mg by mouth See admin instructions. Take 15 mg in am and 10 mg in pm 02/07/16   [name], MD  meclizine (  ANTIVERT) 25 MG tablet Take 1 tablet (25 mg total) by mouth 3 (three) times daily as needed for dizziness. 04/11/16   [name], MD  ondansetron (ZOFRAN ODT) 4 MG disintegrating tablet Take 1 tablet (4 mg total) by mouth every 8 (eight) hours as needed for nausea or vomiting. 03/23/16   [name], [name], DO  vitamin B-12 (CYANOCOBALAMIN) 500 MCG tablet Take 500 mcg by mouth daily.    [provider]   Social History   Social History  . Marital status: Single    Spouse name: [name]  . Number of children: [name]  . Years of education: [name]   Occupational History  . Not on file.   Social History Main Topics  . Smoking status: Never Smoker  . Smokeless  tobacco: Never Used  . Alcohol use No  . Drug use: No  . Sexual activity: Not on file   Other Topics Concern  . Not on file   Social History Narrative  . No narrative on file   Review of Systems  Constitutional: Negative for chills, fatigue, fever and unexpected weight change.  Respiratory: Negative for cough.   Gastrointestinal: Negative for constipation, diarrhea, nausea and vomiting.  Musculoskeletal: Positive for arthralgias. Negative for gait problem and joint swelling.  Skin: Negative for rash and wound.  Neurological: Negative for dizziness, weakness, numbness and headaches.       Objective:   Physical Exam  Constitutional: She is oriented to person, place, and time. She appears well-developed and well-nourished. No distress.  HENT:  Head: Normocephalic and atraumatic.  Eyes: Pupils are equal, round, and reactive to light. EOM are normal.  Neck: Neck supple.  Cardiovascular: Normal rate.   Pulmonary/Chest: Effort normal. No respiratory distress.  Musculoskeletal: Normal range of motion.  Left foot: plantar fascitis, no bony tenderness of the foot  Neurological: She is alert and oriented to person, place, and time.  Skin: Skin is warm and dry.  Psychiatric: She has a normal mood and affect. Her behavior is normal.  Nursing note and vitals reviewed.   Vitals:   04/19/17 1348  BP: 110/72  Pulse: 81  Resp: 16  Temp: 98 F (36.7 C)  TempSrc: Oral  SpO2: 97%  Weight: 223 lb (101.2 kg)  Height: 5' 0.24" (1.53 m)   Dg Ankle Complete Left  Result Date: 04/19/2017 CLINICAL DATA:  Chronic left ankle pain EXAM: LEFT ANKLE COMPLETE - 3+ VIEW COMPARISON:  None. FINDINGS: No acute fracture or dislocation of the left ankle. There is moderate flattening of the plantar arch. Mild osteoarthrosis of the left ankle. No focal bone lesion. IMPRESSION: Mild left ankle osteoarthrosis and flattening of the plantar arch. No acute abnormality. Electronically Signed   By: Tracy Larson  M.D.   On: 04/19/2017 14:31       Assessment & Plan:   Tracy Larson is a 61 y.o. female Chronic pain of left ankle - Plan: DG Ankle Complete Left  -Intermittent pain of ankle with walking, pain in the foot at times without apparent signs of sprain based on current exam or further discussion of history. X-ray does indicate some osteoarthritis of the ankle without focal bone abnormalities. Asymptomatic at present.  -Sample ankle exercises were provided for home exercise program and strengthening to help stabilize ankle  -Tylenol over-the-counter as needed for areas of pain in ankle foot, as well as pain that she noted in the base of her thumb which is likely arthritis. Did ask her to follow-up to discuss  this area as well as any other painful areas further.   -At the end of the visit she stated that she was mistaken - not her ankle but sometimes her knee feels like it is painful. Denies current symptoms, advised to discuss further at follow-up in a few weeks.  Long term (current) use of systemic steroids  -Followed by endocrinology for CAH. It appears bone density testing was ordered in 2016, but I do not results since 2013. However at that time her bone density was overall normal. Ask that she call her endocrinologist to see if new order needed or if she can have bone density testing that was ordered previously. She is also due for a repeat office visit with endocrinology.  No orders of the defined types were placed in this encounter.  Patient Instructions   Your last bone density tests appeared normal, but that was 5 years ago. It does appear that your endocrinologist did order a bone density in 2016, so would discuss with their office where you need to go to have that performed or whether they need to enter a new order.  For ankle pain, I do not think that is due to broken or thin bones. It sounds like possible arthritis, but may be better with some home exercises. See those below and pick  2 different exercises each day. Tylenol over-the-counter is fine for arthritis type pain in the feet, ankles, or if you are having some arthritis hand pain. Please follow-up in the next few weeks to discuss those other areas of pain further, sooner if worse. If any concerns on your xray, I will let you know.   If any return of knee symptoms, please return for recheck.   Ankle Exercises Ask your health care provider which exercises are safe for you. Do exercises exactly as told by your health care provider and adjust them as directed. It is normal to feel mild stretching, pulling, tightness, or discomfort as you do these exercises, but you should stop right away if you feel sudden pain or your pain gets worse. Do not begin these exercises until told by your health care provider. Stretching and range of motion exercises These exercises warm up your muscles and joints and improve the movement and flexibility of your ankle. These exercises also help to relieve pain, numbness, and tingling. Exercise A: Dorsiflexion/Plantar Flexion  1. Sit with your __________ knee straight or bent. Do not rest your foot on anything. 2. Flex your __________ ankle to tilt the top of your foot toward your shin. 3. Hold this position for __________ seconds. 4. Point your toes downward to tilt the top of your foot away from your shin. 5. Hold this position for __________ seconds. Repeat __________ times. Complete this exercise __________ times a day. Exercise B: Ankle Alphabet  1. Sit with your __________ foot supported at your lower leg. ? Do not rest your foot on anything. ? Make sure your foot has room to move freely. 2. Think of your __________ foot as a paintbrush, and move your foot to trace each letter of the alphabet in the air. Keep your hip and knee still while you trace. Make the letters as large as you can without increasing any discomfort. 3. Trace every letter from A to Z. Repeat __________ times. Complete  this exercise __________ times a day. Exercise C: Ankle Dorsiflexion, Passive 1. Sit on a chair that is placed on a non-carpeted surface. 2. Place your __________ foot on the floor, directly  under your __________ knee. Extend your __________ leg for support. 3. Keeping your heel down, slide your __________ foot back toward the chair until you feel a stretch at your ankle or calf. If you do not feel a stretch, slide your buttocks forward to the edge of the chair. 4. Hold this stretch for __________ seconds. Repeat __________ times. Complete this stretch __________ times a day. Strengthening exercises These exercises build strength and endurance in your ankle. Endurance is the ability to use your muscles for a long time, even after they get tired. Exercise D: Dorsiflexors  1. Secure a rubber exercise band or tube to an object, such as a table leg, that will stay still when the band is pulled. Secure the other end around your __________ foot. 2. Sit on the floor, facing the object with your __________ leg extended. The band or tube should be slightly tense when your foot is relaxed. 3. Slowly flex your __________ ankle and toes to bring your foot toward you. 4. Hold this position for __________ seconds. 5. Slowly return your foot to the starting position, controlling the band as you do that. Repeat __________ times. Complete this exercise __________ times a day. Exercise E: Plantar Flexors  1. Sit on the floor with your __________ leg extended. 2. Loop a rubber exercise band or tube around the ball of your __________ foot. The ball of your foot is on the walking surface, right under your toes. The band or tube should be slightly tense when your foot is relaxed. 3. Slowly point your toes downward, pushing them away from you. 4. Hold this position for __________ seconds. 5. Slowly release the tension in the band or tube, controlling smoothly until your foot is back in the starting position. Repeat  __________ times. Complete this exercise __________ times a day. Exercise F: Towel Curls  1. Sit in a chair on a non-carpeted surface, and put your feet on the floor. 2. Place a towel in front of your feet. If told by your health care provider, add __________ to the end of the towel. 3. Keeping your heel on the floor, put your __________ foot on the towel. 4. Pull the towel toward you by grabbing the towel with your toes and curling them under. Keep your heel on the floor. 5. Let your toes relax. 6. Grab the towel again. Keep going until the towel is completely underneath your foot. Repeat __________ times. Complete this exercise __________ times a day. Exercise G: Heel Raise ( Plantar Flexors, Standing) 1. Stand with your feet shoulder-width apart. 2. Keep your weight spread evenly over the width of your feet while you rise up on your toes. Use a wall or table to steady yourself, but try not to use it for support. 3. If this exercise is too easy, try these options: ? Shift your weight toward your __________ leg until you feel challenged. ? If told by your health care provider, lift your uninjured leg off the floor. 4. Hold this position for __________ seconds. Repeat __________ times. Complete this exercise __________ times a day. Exercise H: Tandem Walking 1. Stand with one foot directly in front of the other. 2. Slowly raise your back foot up, lifting your heel before your toes, and place it directly in front of your other foot. 3. Continue to walk in this heel-to-toe way for __________ or for as long as told by your health care provider. Have a countertop or wall nearby to use if needed to keep your  balance, but try not to hold onto anything for support. Repeat __________ times. Complete this exercises __________ times a day. This information is not intended to replace advice given to you by your health care provider. Make sure you discuss any questions you have with your health care  provider. Document Released: 05/02/2005 Document Revised: 02/16/2016 Document Reviewed: 03/06/2015 Elsevier Interactive Patient Education  2018 [employer].   IF you received an x-ray today, you will receive an invoice from [facility]. Please contact [facility] at [phone] with questions or concerns regarding your invoice.   IF you received labwork today, you will receive an invoice from [facility]. Please contact [facility] at [phone] with questions or concerns regarding your invoice.   Our billing staff will not be able to assist you with questions regarding bills from these companies.  You will be contacted with the lab results as soon as they are available. The fastest way to get your results is to activate your My Chart account. Instructions are located on the last page of this paperwork. If you have not heard from [place] regarding the results in 2 weeks, please contact this office.        I personally performed the services described in this documentation, which was scribed in my presence. The recorded information has been reviewed and considered for accuracy and completeness, addended by me as needed, and agree with information above.  Signed,   Tracy Staggers, MD [facility] at [facility].  04/19/17 3:06 PM

## 2017-04-19 NOTE — Addendum Note (Signed)
Addended by: Meredith StaggersGREENE, Koya Hunger R on: 04/19/2017 02:15 PM   Modules accepted: Orders

## 2017-05-04 ENCOUNTER — Other Ambulatory Visit: Payer: Self-pay | Admitting: Internal Medicine

## 2017-05-04 DIAGNOSIS — E250 Congenital adrenogenital disorders associated with enzyme deficiency: Secondary | ICD-10-CM

## 2017-07-17 ENCOUNTER — Encounter: Payer: Self-pay | Admitting: Physician Assistant

## 2017-07-17 ENCOUNTER — Ambulatory Visit (INDEPENDENT_AMBULATORY_CARE_PROVIDER_SITE_OTHER): Payer: Medicare Other | Admitting: Internal Medicine

## 2017-07-17 ENCOUNTER — Telehealth: Payer: Self-pay

## 2017-07-17 ENCOUNTER — Encounter: Payer: Self-pay | Admitting: Internal Medicine

## 2017-07-17 ENCOUNTER — Ambulatory Visit (INDEPENDENT_AMBULATORY_CARE_PROVIDER_SITE_OTHER): Payer: Medicare Other | Admitting: Physician Assistant

## 2017-07-17 VITALS — BP 130/80 | HR 87 | Temp 98.2°F | Resp 17 | Ht 60.5 in | Wt 229.0 lb

## 2017-07-17 VITALS — BP 113/90 | HR 92 | Ht 60.5 in | Wt 229.6 lb

## 2017-07-17 DIAGNOSIS — Z7952 Long term (current) use of systemic steroids: Secondary | ICD-10-CM | POA: Diagnosis not present

## 2017-07-17 DIAGNOSIS — E559 Vitamin D deficiency, unspecified: Secondary | ICD-10-CM | POA: Diagnosis not present

## 2017-07-17 DIAGNOSIS — Z8739 Personal history of other diseases of the musculoskeletal system and connective tissue: Secondary | ICD-10-CM | POA: Diagnosis not present

## 2017-07-17 DIAGNOSIS — E250 Congenital adrenogenital disorders associated with enzyme deficiency: Secondary | ICD-10-CM | POA: Diagnosis not present

## 2017-07-17 DIAGNOSIS — R002 Palpitations: Secondary | ICD-10-CM | POA: Diagnosis not present

## 2017-07-17 DIAGNOSIS — R079 Chest pain, unspecified: Secondary | ICD-10-CM

## 2017-07-17 LAB — VITAMIN D 25 HYDROXY (VIT D DEFICIENCY, FRACTURES): VITD: 19.27 ng/mL — ABNORMAL LOW (ref 30.00–100.00)

## 2017-07-17 MED ORDER — HYDROCORTISONE 10 MG PO TABS: ORAL_TABLET | ORAL | 3 refills | Status: DC

## 2017-07-17 MED ORDER — OMEPRAZOLE 20 MG PO CPDR: 20.0000 mg | DELAYED_RELEASE_CAPSULE | Freq: Every day | ORAL | 3 refills | Status: DC

## 2017-07-17 NOTE — Progress Notes (Addendum)
Patient ID: Tracy Larson, female   DOB: January 08, 1956, 62 y.o.   MRN: [id number]   HPI  Tracy Larson is a 62 y.o.-year-old female, initially referred by her PCP, Dr. Conley Rolls, now returning for follow-up for congenital adrenal hyperplasia (most likely non-classical form), adrenal insufficiency, and vitamin D deficiency. She has been followed by Dr. Sharl Ma, but had to switch dr's b/c outstanding bill with his office. Last visit with me 1.5 years ago.  Since last visit, she is feeling well, with still fatigue, but no more dizziness. She feels her hydrocortisone is finally right.  Reviewed and addended history: Pt. has been dx with adrenal insufficiency at birth and then 62 y/o - became pregnant (she had 4 children -no history of infertility).   She did not have to have vaginoplasty.  She tried fludrocortisone (but developed edema and hypertension and had to stop).  She has h/o adrenal crises:   1985   2005 - 2/2 flu [facility]).  She did not have genetic counseling at dx.  She is on replacement with hydrocortisone previously on high dose, up to 100 mg daily for 15 years when her children were growing up, then decreased to 50 mg daily for another 10 years, 40 mg daily for 16 years.  We decreased the dose and she tried several lower doses, currently on 20 mg in a.m. (at 6 am, then goes back to bed), then wakes up definitively at 12 pm. She only adds pm 10 mg on Wednesday.  She is aware she needs to increase the dose of glucocorticoid if she has fever and needs to get injectable steroids if she cannot take p.o.  She did not have to do this since last visit. She had dizziness, N/V >> doubled the dose for 1 mo 1.5 years ago.  No significant weight gain, diabetes, hypertension, or fractures.  She does have hirsutism on chin, no acne.No orthostasis.  She had a DXA scan in 2013, normal: [facility] Lumbar spine (L1-L3) Femoral neck (FN) 33% distal radius  T-score + 0.5 RFN:- 0.1 LFN:+ 0.5  n/a   However, she remembers being told that she had osteoporosis before this last DXA scan result.  Reviewed together her previous labs -at last visit, DHEAS and ACTH were too suppressed, a sign of excessive hydrocortisone dosing.  Unfortunately, she was resistant to decrease the dose more... Component     Latest Ref Rng & Units 02/07/2016  Testosterone     3 - 41 ng/dL 37  Sex Horm Binding Glob, Serum     17.3 - 125.0 nmol/L 46.7  Testosterone Free     0.0 - 4.2 pg/mL 1.8  ALDOSTERONE     0.0 - 30.0 ng/dL 1.0  Renin     4.098 - 5.380 ng/mL/hr 1.048  ALDOS/RENIN RATIO     0.0 - 30.0 1.0  Potassium     3.5 - 5.1 mEq/L 3.9  DHEA-SO4     29.4 - 220.5 ug/dL 11.9 (L)  14-[id number]     ng/dL 295  ACTH     7.2 - 28.4 pg/mL 9.2   Previously: Component     Latest Ref Rng & Units 08/06/2014 02/07/2015  Testosterone     10 - 70 ng/dL 66   Sex Horm Binding Glob, Serum     14 - 73 nmol/L 25   Testosterone Free     0.6 - 6.8 pg/mL 13.9 (H)   Testosterone-% Free     0.4 - 2.4 % 2.1  PRA LC/MS/MS     0.25 - 5.82 ng/mL/h 1.67 5.73  ALDO / PRA Ratio     0.9 - 28.9 Ratio 1.8 1.6  ALDOSTERONE     ng/dL 3 9  DHEA     161 - 0,960 ng/dL 27 (L)   Androstenedione     ng/dL 10   45-[id number], LC/MS/MS     ng/dL 6,213 (H) 0,865 (H)  H846 ACTH     6 - 50 pg/mL 78 (H) 57 (H)  Potassium     3.5 - 5.1 mEq/L  3.9  DHEA-SO4     8 - 188 ug/dL  26   Reviewed records from Dr. Sharl Ma: - Patient was previously on fludrocortisone, however, this rendered her hypertensive and with edema >>  fludrocortisone was eventually stopped. Dr. Sharl Ma mentioned that she is on a high dose of steroids, and probably her many years of over replacement with steroids have caused her secondary adrenal insufficiency.  Labs from 2014 were reviewed: 09/24/2012: - 17 hydroxyprogesterone 6180 - Hemoglobin A1c 5.4% - Lipids: 176/94/40/126 - TSH 1.94 - LFTs normal - ACTH 201.7 - DHEAS 42.5 - Aldosterone  <1, PRA 1.37  She also has a vitamin D deficiency and was on ergocalciferol.  We switched to over-the-counter vitamin D3 5000 units daily -advised to start at last visit. She did not take it...  Lab Results  Component Value Date   VD25OH 16.22 (L) 02/07/2016   VD25OH 11.32 (L) 02/07/2015   She c/o CP >> did not see cardiology but is considering this.  ROS: Constitutional: no weight gain/no weight loss, + fatigue, + hot flushes Eyes: + blurry vision, no xerophthalmia ENT: no sore throat, no nodules palpated in throat, no dysphagia, no odynophagia, no hoarseness Cardiovascular: + CP/+ SOB/no palpitations/no leg swelling Respiratory: no cough/+ SOB/no wheezing Gastrointestinal: + N/no V/+ D/no C/no acid reflux Musculoskeletal: + muscle aches/+ joint aches Skin: + rash, + hair loss Neurological: no tremors/no numbness/no tingling/no dizziness, + HA  I reviewed pt's medications, allergies, PMH, social hx, family hx, and changes were documented in the history of present illness. Otherwise, unchanged from my initial visit note.   Past Medical History:  Diagnosis Date  . Adrenal hyperplasia (HCC)    Past surgical history: - C sections  History   Social History  . Marital Status: Married    Spouse Name: [name]    Number of Children: 4   Occupational History  .  nurse tech    Social History Main Topics  . Smoking status: Never Smoker   . Smokeless tobacco: Not on file  . Alcohol Use: No  . Drug Use: No   Current Outpatient Medications on File Prior to Visit  Medication Sig Dispense Refill  . celecoxib (CELEBREX) 200 MG capsule Take 200 mg by mouth daily.    Katheran James Jelly (GINSENG COMPLEX/ROYAL JELLY PO) Take 800 mg by mouth 2 (two) times daily.    . hydrocortisone (CORTEF) 10 MG tablet TAKE ONE AND ONE-HALF TABLETS (15 MG) IN THE MORNING AND 1 TABLET IN THE EVENING 75 tablet 0  . vitamin B-12 (CYANOCOBALAMIN) 500 MCG tablet Take 500 mcg by mouth daily.     No  current facility-administered medications on file prior to visit.    No Known Allergies   No family history of diabetes, hypertension, hyperlipidemia, heart disease, cancer.   PE: BP 113/90   Pulse 92   Ht 5' 0.5" (1.537 m)   Wt 229 lb 9.6 oz (104.1 kg)  SpO2 95%   BMI 44.10 kg/m  Body mass index is 44.1 kg/m.  Wt Readings from Last 3 Encounters:  07/17/17 229 lb 9.6 oz (104.1 kg)  04/19/17 223 lb (101.2 kg)  04/11/16 250 lb (113.4 kg)   Constitutional: Obese, in NAD, + supraclavicular fat pads Eyes: PERRLA, EOMI, no exophthalmos ENT: moist mucous membranes, no thyromegaly, no cervical lymphadenopathy Cardiovascular: tachycardia, RR, No MRG Respiratory: CTA B Gastrointestinal: abdomen soft, NT, ND, BS+ Musculoskeletal: no deformities, strength intact in all 4 Skin: moist, warm, no rashes, + hirsutism on chin Neurological: no tremor with outstretched hands, DTR normal in all 4  ASSESSMENT: 1. Nonclassical CAH - CAH = a condition in which the adrenal glands cannot produce enough cortisol and the hormone production is redirected towards androgen production via increased ACTH. Therefore, we tx this with glucocorticoids not only to treat the adrenal insufficiency, but also to decrease the ACTH and, therefore, adrenal androgens. OCPs also help in reducing the androgens.   2. Adrenal insufficiency - Associated with her CAH   3. Vitamin D deficiency  PLAN:  1. And 2. Patient has a history of lifelong CAH, most likely nonclassical CAH with adrenal insufficiency, since she did not have to have vaginoplasty and she was successful in getting pregnant with 4 children. - She has no sodium or potassium abnormalities, no hypotension, and was not orthostatic in the past.  Therefore, no fludrocortisone if needed.  She actually tried fludrocortisone in the past and developed hypertension and edema.  Hydrocortisone also has some mineralocorticoid activity, and she is not eating a low-salt  diet, so for now, we can continue only on hydrocortisone. - Over the years, she decrease her hydrocortisone dose significantly, now finally on the correct dose of 20 mg daily - she prefers to take this all at once in am. Once a week she takes 10 mg in the pm. She feels this is enough.  We again discussed that ideally she would have a dose closer to bedtime to suppress ACTH production, however, she cannot sleep if she takes the hydrocortisone too late. - Reviewed together last visits labs in which the ACTH, 17 hydroxyprogesterone, and DHEAS were normal, but this is usually too low for CAH, indicating supraphysiologic hydrocortisone dose. Since then, she did decrease the dose further. We again discussed about the importance of using the lowest dose of hydrocortisone possible to control her symptoms. - We will not recheck the PRA and aldosterone at this visit, but we will repeat 17 hydroxyprogesterone, DHEAS, ACTH, and testosterone. - We also need to check another bone mineral density >> [facility]  - I suggested to: Patient Instructions  Continue hydrocortisone 20 mg in a.m. (and 10 mg in p.m. On Wednesday).  Please stop at the lab.  - I reiterated sick days rules for hydrocortisone: - You absolutely need to take this medication every day and not skip doses. - Please double the dose if you have a fever, for the duration of the fever. - If you cannot take anything by mouth (vomiting) or you have severe diarrhea so that you eliminate the hydrocortisone pills in your stool, please make sure that you get the hydrocortisone in the vein instead - go to the nearest emergency department/urgent care or you may go to your PCPs office  -  I will see the patient back in 1 year  3. Vitamin D deficiency - Vitamin D level was again low at last visit so I advised her to start 5000  units vitamin D3 daily >> did not start... - We will recheck her vitamin D level today  Orders Placed This Encounter  Procedures   . DG Bone Density    Standing Status:   Future    Standing Expiration Date:   09/15/2018    Order Specific Question:   Reason for Exam (SYMPTOM  OR DIAGNOSIS REQUIRED)    Answer:   CAH, adrenal insufficiency, h/o high doses of steroids    Order Specific Question:   Is the patient pregnant?    Answer:   No    Order Specific Question:   Preferred imaging location?    Answer:   [facility]  . DHEA-Sulfate, Serum  . 17-Hydroxyprogesterone  . Testosterone Free with SHBG  . ACTH  . VITAMIN D 25 Hydroxy (Vit-D Deficiency, Fractures)   Component     Latest Ref Rng & Units 07/17/2017  Testosterone, Serum (Total)     ng/dL 49  % Free Testosterone     % 1.1  Free Testosterone, S     pg/mL 5.4  Sex Hormone Binding Globulin     nmol/L 54.8  DHEA-Sulfate, LCMS     ug/dL 14  96-EA-[id number], LC/MS/MS     ng/dL 295  A213 ACTH     6 - 50 pg/mL 9  VITD     30.00 - 100.00 ng/mL 19.27 (L)   Low vitamin D.  I will again advised her to start 5000 vitamin D daily. All her labs normalized (except 17 hydroxyprogesterone, which is still slightly high) after she decreased the dose of her hydrocortisone.  During treatment with CAH, it is unusual to obtain normal adrenal labs and they are usually apparent when patient is taking too much hydrocortisone.  This is not her case.  We will continue the current hydrocortisone doses.  Date of study: 07/22/17 Results:  Lumbar spine L1-L4 Femoral neck (FN) 33% distal radius  T-score 1.6 RFN: 0.4 LFN: 0.3 n/a   T-scores remain normal.  [name], MD PhD [facility] Endocrinology

## 2017-07-17 NOTE — Telephone Encounter (Signed)
The [facility] office called and stated that the DEXA scan has the wrong diagnosis code and that it will need to be changed for insurance to cover this scan.

## 2017-07-17 NOTE — Patient Instructions (Addendum)
Please continue: Hydrocortisone 20 mg in a.m. (and 10 mg in p.m. On Wednesday).  Please stop at the lab.  - You absolutely need to take this medication every day and not skip doses. - Please double the dose if you have a fever, for the duration of the fever. - If you cannot take anything by mouth (vomiting) or you have severe diarrhea so that you eliminate the hydrocortisone pills in your stool, please make sure that you get the hydrocortisone in the vein instead - go to the nearest emergency department/urgent care or you may go to your PCPs office   Please come back for a follow-up appointment in 1 year.

## 2017-07-17 NOTE — Progress Notes (Signed)
PRIMARY CARE AT [facility] [address] [place] [address] [phone]  Date:  07/17/2017   Name:  Tamika Shropshire   DOB:  May 19, 1956   MRN:  [id number]  PCP:  Patient, No Pcp Per    History of Present Illness:  Tauna Macfarlane is a 62 y.o. female patient who presents to PCP with  Chief Complaint  Patient presents with  . Chest Pain     Chest pain for 1 month.  Pressure intermittent, lasting for 20 minutes.  It has occurred with laying down.  Center of chest with associated numbness of her right leg and sometimes her right side.  No nausea with the chest pressure.  She has diaphoresis associated with the chest pain, or just prior.  And she will get sob.  She has not had the numbness sensation for the last 3 weeks.  No heavy lifting.  Patient generally sees her endocrinologist, but after Dr [name] concern, she wanted a PCP to refer her to cardiology for consult.   She has been having some hot flashes. She thought it may be indigestion as well. Denies sour taste.  No black or bloody stool.   Her right leg became leg numb with pain in her chest.   She reports that she was in menopause at 46, but states that it ended when her menses stopped at 51 (no hot flashes). Foods diet: greens, beets, very little meat.  No fried foods.  No sodas.  Caffeine: a lot of coffee and tea "24 hours around the clock". She took aspirin the last time (81 mg).   Congenital adrenal hyperplasia--with long term hx of steroid use.  Wt Readings from Last 3 Encounters:  07/17/17 229 lb (103.9 kg)  07/17/17 229 lb 9.6 oz (104.1 kg)  04/19/17 223 lb (101.2 kg)    Patient Active Problem List   Diagnosis Date Noted  . Vitamin D deficiency 02/14/2016  . Long term current use of systemic steroids 02/10/2015  . Osteopenia 02/10/2015  . Notalgia   . Bilateral thoracic back pain   . Absence of bladder continence   . Fecal incontinence   . Congenital adrenal hyperplasia (HCC)   . Back pain 11/27/2014  . Morbid  obesity (HCC) 11/08/2014  . Arthritis 06/04/2014    Past Medical History:  Diagnosis Date  . Adrenal hyperplasia [facility])     Past Surgical History:  Procedure Laterality Date  . CESAREAN SECTION      Social History   Tobacco Use  . Smoking status: Never Smoker  . Smokeless tobacco: Never Used  Substance Use Topics  . Alcohol use: No  . Drug use: No    No family history on file.  No Known Allergies  Medication list has been reviewed and updated.  Current Outpatient Medications on File Prior to Visit  Medication Sig Dispense Refill  . hydrocortisone (CORTEF) 10 MG tablet Take 20 mg in am and 10 mg in pm 270 tablet 3  . celecoxib (CELEBREX) 200 MG capsule Take 200 mg by mouth daily.    Katheran James Jelly (GINSENG COMPLEX/ROYAL JELLY PO) Take 800 mg by mouth 2 (two) times daily.    . vitamin B-12 (CYANOCOBALAMIN) 500 MCG tablet Take 500 mcg by mouth daily.     No current facility-administered medications on file prior to visit.     ROS ROS otherwise unremarkable unless listed above.  Physical Examination: BP 130/80   Pulse 87   Temp 98.2 F (36.8 C) (Oral)  Resp 17   Ht 5' 0.5" (1.537 m)   Wt 229 lb (103.9 kg)   SpO2 98%   BMI 43.99 kg/m  Ideal Body Weight: Weight in (lb) to have BMI = 25: 129.9  Physical Exam  Constitutional: She is oriented to person, place, and time. She appears well-developed and well-nourished. No distress.  HENT:  Head: Normocephalic and atraumatic.  Right Ear: External ear normal.  Left Ear: External ear normal.  Eyes: Conjunctivae and EOM are normal. Pupils are equal, round, and reactive to light.  Cardiovascular: Normal rate.  Pulmonary/Chest: Effort normal. No respiratory distress.  Neurological: She is alert and oriented to person, place, and time.  Skin: She is not diaphoretic.  Psychiatric: She has a normal mood and affect. Her behavior is normal.   EKG: normal EKG, normal sinus rhythm, unchanged from previous  tracings.  . Assessment and Plan: Howell PringleJennifer Mcnaught is a 62 y.o. female who is here today for cc of  Chief Complaint  Patient presents with  . Chest Pain  diff dx: GERD vs gastritis, vs cardiovascular events. Given report of hx and endocrinology concern, will refer for consult of cardiology.   Given omeprazole and GERD diet.  She is adamant that she will not stop her coffee intake.  Advised to review and follow GERD restriction diet the best she can.  Chest pain, unspecified type - Plan: EKG 12-Lead, TSH, omeprazole (PRILOSEC) 20 MG capsule, Basic metabolic panel, Ambulatory referral to Cardiology  Palpitations - Plan: TSH, Basic metabolic panel, Ambulatory referral to Cardiology  Trena PlattStephanie English, PA-C [facility] 1/16/20194:47 PM

## 2017-07-17 NOTE — Patient Instructions (Addendum)
Please start the omeprazole either now, or when the chest pain starts.  I am referring you to a cardiologist, so please await follow up.   Food Choices for Gastroesophageal Reflux Disease, Adult When you have gastroesophageal reflux disease (GERD), the foods you eat and your eating habits are very important. Choosing the right foods can help ease your discomfort. What guidelines do I need to follow?  Choose fruits, vegetables, whole grains, and low-fat dairy products.  Choose low-fat meat, fish, and poultry.  Limit fats such as oils, salad dressings, butter, nuts, and avocado.  Keep a food diary. This helps you identify foods that cause symptoms.  Avoid foods that cause symptoms. These may be different for everyone.  Eat small meals often instead of 3 large meals a day.  Eat your meals slowly, in a place where you are relaxed.  Limit fried foods.  Cook foods using methods other than frying.  Avoid drinking alcohol.  Avoid drinking large amounts of liquids with your meals.  Avoid bending over or lying down until 2-3 hours after eating. What foods are not recommended? These are some foods and drinks that may make your symptoms worse: Vegetables Tomatoes. Tomato juice. Tomato and spaghetti sauce. Chili peppers. Onion and garlic. Horseradish. Fruits Oranges, grapefruit, and lemon (fruit and juice). Meats High-fat meats, fish, and poultry. This includes hot dogs, ribs, ham, sausage, salami, and bacon. Dairy Whole milk and chocolate milk. Sour cream. Cream. Butter. Ice cream. Cream cheese. Drinks Coffee and tea. Bubbly (carbonated) drinks or energy drinks. Condiments Hot sauce. Barbecue sauce. Sweets/Desserts Chocolate and cocoa. Donuts. Peppermint and spearmint. Fats and Oils High-fat foods. This includes [place] fries and potato chips. Other Vinegar. Strong spices. This includes black pepper, white pepper, red pepper, cayenne, curry powder, cloves, ginger, and chili  powder. The items listed above may not be a complete list of foods and drinks to avoid. Contact your dietitian for more information. This information is not intended to replace advice given to you by your health care provider. Make sure you discuss any questions you have with your health care provider. Document Released: 12/18/2011 Document Revised: 11/24/2015 Document Reviewed: 04/22/2013 Elsevier Interactive Patient Education  2017 [employer]     IF you received an x-ray today, you will receive an invoice from [facility]. Please contact [facility] at [phone] with questions or concerns regarding your invoice.   IF you received labwork today, you will receive an invoice from [facility]. Please contact [facility] at [phone] with questions or concerns regarding your invoice.   Our billing staff will not be able to assist you with questions regarding bills from these companies.  You will be contacted with the lab results as soon as they are available. The fastest way to get your results is to activate your My Chart account. Instructions are located on the last page of this paperwork. If you have not heard from us regarding the results in 2 weeks, please contact this office.

## 2017-07-18 LAB — BASIC METABOLIC PANEL
BUN / CREAT RATIO: 13 (ref 12–28)
BUN: 9 mg/dL (ref 8–27)
CALCIUM: 9.4 mg/dL (ref 8.7–10.3)
CO2: 24 mmol/L (ref 20–29)
Chloride: 101 mmol/L (ref 96–106)
Creatinine, Ser: 0.71 mg/dL (ref 0.57–1.00)
GFR, EST AFRICAN AMERICAN: 107 mL/min/{1.73_m2} (ref >59)
GFR, EST NON AFRICAN AMERICAN: 93 mL/min/{1.73_m2} (ref >59)
Glucose: 93 mg/dL (ref 65–99)
POTASSIUM: 4.7 mmol/L (ref 3.5–5.2)
Sodium: 141 mmol/L (ref 134–144)

## 2017-07-18 LAB — TSH: TSH: 1.13 u[IU]/mL (ref 0.450–4.500)

## 2017-07-18 NOTE — Telephone Encounter (Signed)
OK, I will change it.

## 2017-07-21 LAB — 17-HYDROXYPROGESTERONE: 17-OH-Progesterone, LC/MS/MS: 263 ng/dL

## 2017-07-21 LAB — ACTH: C206 ACTH: 9 pg/mL (ref 6–50)

## 2017-07-22 ENCOUNTER — Ambulatory Visit (INDEPENDENT_AMBULATORY_CARE_PROVIDER_SITE_OTHER)
Admission: RE | Admit: 2017-07-22 | Discharge: 2017-07-22 | Disposition: A | Discharge status: Home / Self Care | Payer: Medicare Other | Source: Ambulatory Visit | Attending: Internal Medicine | Admitting: Internal Medicine

## 2017-07-22 DIAGNOSIS — Z7952 Long term (current) use of systemic steroids: Secondary | ICD-10-CM | POA: Diagnosis not present

## 2017-07-22 DIAGNOSIS — E250 Congenital adrenogenital disorders associated with enzyme deficiency: Secondary | ICD-10-CM

## 2017-07-22 LAB — TESTOSTERONE, FREE AND TOTAL (INCLUDES SHBG)-(MALES)
% FREE TESTOSTERONE: 1.1 %
FREE TESTOSTERONE, S: 5.4 pg/mL
Sex Hormone Binding Globulin: 54.8 nmol/L
Testosterone, Serum (Total): 49 ng/dL

## 2017-07-22 LAB — DHEA-SULFATE, SERUM: DHEA: 14 ug/dL

## 2017-08-02 DIAGNOSIS — Z0189 Encounter for other specified special examinations: Secondary | ICD-10-CM | POA: Diagnosis not present

## 2017-08-02 DIAGNOSIS — E250 Congenital adrenogenital disorders associated with enzyme deficiency: Secondary | ICD-10-CM | POA: Diagnosis not present

## 2017-08-02 DIAGNOSIS — R079 Chest pain, unspecified: Secondary | ICD-10-CM | POA: Diagnosis not present

## 2017-08-17 ENCOUNTER — Encounter: Payer: Self-pay | Admitting: Physician Assistant

## 2017-08-17 DIAGNOSIS — R079 Chest pain, unspecified: Secondary | ICD-10-CM | POA: Insufficient documentation

## 2017-08-19 DIAGNOSIS — R079 Chest pain, unspecified: Secondary | ICD-10-CM | POA: Diagnosis not present

## 2017-08-19 DIAGNOSIS — R0789 Other chest pain: Secondary | ICD-10-CM | POA: Diagnosis not present

## 2017-08-19 DIAGNOSIS — R0602 Shortness of breath: Secondary | ICD-10-CM | POA: Diagnosis not present

## 2017-08-21 DIAGNOSIS — R079 Chest pain, unspecified: Secondary | ICD-10-CM | POA: Diagnosis not present

## 2017-08-21 DIAGNOSIS — Z0189 Encounter for other specified special examinations: Secondary | ICD-10-CM | POA: Diagnosis not present

## 2017-08-21 DIAGNOSIS — E250 Congenital adrenogenital disorders associated with enzyme deficiency: Secondary | ICD-10-CM | POA: Diagnosis not present

## 2017-08-26 DIAGNOSIS — R0789 Other chest pain: Secondary | ICD-10-CM | POA: Diagnosis not present

## 2017-10-09 ENCOUNTER — Encounter: Payer: Self-pay | Admitting: Physician Assistant

## 2018-04-14 ENCOUNTER — Telehealth: Payer: Self-pay | Admitting: Internal Medicine

## 2018-04-14 DIAGNOSIS — E250 Congenital adrenogenital disorders associated with enzyme deficiency: Secondary | ICD-10-CM

## 2018-04-14 MED ORDER — HYDROCORTISONE 10 MG PO TABS: ORAL_TABLET | ORAL | 3 refills | Status: DC

## 2018-04-14 NOTE — Telephone Encounter (Signed)
Pt is requesting a refill on hydrocortisone to be called into [place] [facility] on [address]

## 2018-04-14 NOTE — Telephone Encounter (Signed)
Notified patient of message from Dr. Gherghe, patient expressed understanding and agreement. No further questions.  

## 2018-04-14 NOTE — Telephone Encounter (Signed)
RX sent

## 2018-06-20 ENCOUNTER — Encounter (HOSPITAL_COMMUNITY): Payer: Self-pay | Admitting: Emergency Medicine

## 2018-06-20 ENCOUNTER — Ambulatory Visit (HOSPITAL_COMMUNITY)
Admission: EM | Admit: 2018-06-20 | Discharge: 2018-06-20 | Disposition: A | Discharge status: Home / Self Care | Payer: Medicare Other | Attending: Internal Medicine | Admitting: Internal Medicine

## 2018-06-20 DIAGNOSIS — H5789 Other specified disorders of eye and adnexa: Secondary | ICD-10-CM | POA: Diagnosis not present

## 2018-06-20 DIAGNOSIS — H53149 Visual discomfort, unspecified: Secondary | ICD-10-CM | POA: Diagnosis not present

## 2018-06-20 DIAGNOSIS — H53141 Visual discomfort, right eye: Secondary | ICD-10-CM | POA: Insufficient documentation

## 2018-06-20 DIAGNOSIS — H15001 Unspecified scleritis, right eye: Secondary | ICD-10-CM | POA: Diagnosis not present

## 2018-06-20 NOTE — ED Provider Notes (Signed)
[facility]    CSN: [id number] Arrival date & time: 06/20/18  0930     History   Chief Complaint Chief Complaint  Patient presents with  . Conjunctivitis    HPI Howell PringleJennifer Hao is a 62 y.o. female.   62 year old female comes in for 2-day history of right eye redness, drainage, irritation.  Patient states she thinks it has pinkeye, given she had recent exposure.  States "I am here for antibiotics".  She has had blurry vision without diplopia, vision loss.  She has had photophobia.  Denies URI symptoms such as rhinorrhea, nasal congestion, cough.  However, has felt some pressure/pain to the right forehead.  Denies fever, chills, night sweats.  Denies contact lens use, does wear glasses.  Has tried Visine without relief.     Past Medical History:  Diagnosis Date  . Adrenal hyperplasia [facility](HCC)     Patient Active Problem List   Diagnosis Date Noted  . Chest pain 08/17/2017  . Vitamin D deficiency 02/14/2016  . Long term current use of systemic steroids 02/10/2015  . Osteopenia 02/10/2015  . Notalgia   . Bilateral thoracic back pain   . Absence of bladder continence   . Fecal incontinence   . Congenital adrenal hyperplasia (HCC)   . Back pain 11/27/2014  . Morbid obesity (HCC) 11/08/2014  . Arthritis 06/04/2014    Past Surgical History:  Procedure Laterality Date  . CESAREAN SECTION      OB History   No obstetric history on file.      Home Medications    Prior to Admission medications   Medication Sig Start Date End Date Taking? Authorizing Provider  celecoxib (CELEBREX) 200 MG capsule Take 200 mg by mouth daily.    [provider]  Ginsengs-Royal Jelly (GINSENG COMPLEX/ROYAL JELLY PO) Take 800 mg by mouth 2 (two) times daily.    [provider]  hydrocortisone (CORTEF) 10 MG tablet Take 20 mg in am and 10 mg in pm 04/14/18   Carlus PavlovGherghe, Cristina, MD  omeprazole (PRILOSEC) 20 MG capsule Take 1 capsule (20 mg total) by mouth daily.  07/17/17   Trena PlattEnglish, Stephanie D, PA  vitamin B-12 (CYANOCOBALAMIN) 500 MCG tablet Take 500 mcg by mouth daily.    [provider]    Family History History reviewed. No pertinent family history.  Social History Social History   Tobacco Use  . Smoking status: Never Smoker  . Smokeless tobacco: Never Used  Substance Use Topics  . Alcohol use: No  . Drug use: No     Allergies   Patient has no known allergies.   Review of Systems Review of Systems  Reason unable to perform ROS: See HPI as above.     Physical Exam Triage Vital Signs ED Triage Vitals [06/20/18 1051]  Enc Vitals Group     BP 135/78     Pulse Rate 84     Resp 18     Temp (!) 97.2 F (36.2 C)     Temp Source Oral     SpO2 97 %     Weight      Height      Head Circumference      Peak Flow      Pain Score 7     Pain Loc      Pain Edu?      Excl. in GC?    No data found.  Updated Vital Signs BP 135/78 (BP Location: Left Arm)   Pulse 84  Temp (!) 97.2 F (36.2 C) (Oral)   Resp 18   SpO2 97%   Visual Acuity Right Eye Distance:   Left Eye Distance:   Bilateral Distance:    Right Eye Near: R Near: 20/40 Left Eye Near:  L Near: 20/25 Bilateral Near:     Physical Exam Constitutional:      General: She is not in acute distress.    Appearance: She is well-developed.  HENT:     Head: Normocephalic and atraumatic.  Eyes:     General: Lids are normal.     Pupils: Pupils are equal, round, and reactive to light.     Comments: Right eye injection.  Right eye pupils fixed, round, not reactive to light.  Photophobia on exam.  Painful EOM, but intact.  Left eye pupil round, reactive, no consensual photophobia.  Neurological:     Mental Status: She is alert and oriented to person, place, and time.      UC Treatments / Results  Labs (all labs ordered are listed, but only abnormal results are displayed) Labs Reviewed - No data to display  EKG None  Radiology No results  found.  Procedures Procedures (including critical care time)  Medications Ordered in UC Medications - No data to display  Initial Impression / Assessment and Plan / UC Course  I have reviewed the triage vital signs and the nursing notes.  Pertinent labs & imaging results that were available during my care of the patient were reviewed by me and considered in my medical decision making (see chart for details).    Discussed with patient with some alarming symptoms on exam including painful EOM and photophobia with fixed pupil to the right. Patient would like to defer IOP and fluorescein exam to have it done by ophthalmologist. Will have patient follow up with ophthalmologist today for further evaluation. Information given.   Final Clinical Impressions(s) / UC Diagnoses   Final diagnoses:  Redness of right eye  Photophobia of right eye    ED Prescriptions    None        Belinda FisherYu, Gabrielle Mester V, PA-C 06/20/18 1156

## 2018-06-20 NOTE — Discharge Instructions (Addendum)
Please go to Dr Laruth BouchardGroat's office today for further evaluation.  Right eye redness x 2 days with photophobia on exam. Pupils to the right is fixed. No consensual photophobia. Patient deferred tonopen and fluorescein exam in office to have full evaluation at ophthalmology.  VA 20/40 OD, 20/25 OS

## 2018-06-20 NOTE — ED Triage Notes (Signed)
Pt here for "pink eye" to right eye with drainage and irritation

## 2018-06-23 DIAGNOSIS — H15001 Unspecified scleritis, right eye: Secondary | ICD-10-CM | POA: Diagnosis not present

## 2018-07-07 DIAGNOSIS — H15001 Unspecified scleritis, right eye: Secondary | ICD-10-CM | POA: Diagnosis not present

## 2018-07-17 ENCOUNTER — Ambulatory Visit (INDEPENDENT_AMBULATORY_CARE_PROVIDER_SITE_OTHER): Payer: Medicare HMO | Admitting: Internal Medicine

## 2018-07-17 ENCOUNTER — Encounter: Payer: Self-pay | Admitting: Internal Medicine

## 2018-07-17 VITALS — BP 132/80 | HR 88 | Ht 60.5 in | Wt 218.0 lb

## 2018-07-17 DIAGNOSIS — E559 Vitamin D deficiency, unspecified: Secondary | ICD-10-CM

## 2018-07-17 DIAGNOSIS — Z7952 Long term (current) use of systemic steroids: Secondary | ICD-10-CM

## 2018-07-17 DIAGNOSIS — E250 Congenital adrenogenital disorders associated with enzyme deficiency: Secondary | ICD-10-CM | POA: Diagnosis not present

## 2018-07-17 LAB — VITAMIN D 25 HYDROXY (VIT D DEFICIENCY, FRACTURES): VITD: 34.12 ng/mL (ref 30.00–100.00)

## 2018-07-17 NOTE — Progress Notes (Signed)
Patient ID: Tracy Larson, female   DOB: Apr 22, 1956, 63 y.o.   MRN: [id number]   HPI  Tracy Larson is a 63 y.o.-year-old female, initially referred by her PCP, Dr. Conley RollsLe, now returning for follow-up for congenital adrenal hyperplasia (most likely non-classical form), adrenal insufficiency, and vitamin D deficiency. She has been followed by Dr. Sharl MaKerr, but had to switch dr's b/c outstanding bill with his office. Last visit with me 1 year ago.  No significant increased in fatigue since last visit.  She was recently dx'ed with iritis. She was on 50 mg Hydrocortisone for the month of 06/2018 b/c of this.  Now decreased to 30 mg daily.  Reviewed and addended history: Pt. has been dx with adrenal insufficiency at birth and then 63 y/o - became pregnant (she had 4 children -no history of infertility).   She did not have to have vaginoplasty.  She tried fludrocortisone (but developed edema and hypertension and had to stop).  She has h/o adrenal crises:   1985   2005 - 2/2 flu [facility]  She did not have genetic counseling at dx.  She is on replacement with hydrocortisone previously on high dose, up to 100 mg daily for 15 years when her children were growing up, then decreased to 50 mg daily for another 10 years, 40 mg daily for 16 years.  We decreased the dose and she tried several lower doses, currently on 20 mg in a.m. (at 6 am, then goes back to bed), then wakes up definitively at 12 pm >> changed to 20 mg in am and 10 mg in the pm - changed it in 06/2019.  She is aware she needs to increase the dose of glucocorticoid if she has fever and needs to get injectable steroids if she cannot take p.o.  She did not have to do this since last visit. She had dizziness, N/V >> doubled the dose for 1 mo 1.5 years ago.  No significant weight gain, diabetes, hypertension, or fractures.  She does have hirsutism on chin, no acne.  No orthostasis.  Reviewed her previous DXA scans report -normal  T-scores: 07/22/17 Lumbar spine L1-L4 Femoral neck (FN) 33% distal radius  T-score 1.6 RFN: 0.4 LFN: 0.3 n/a   [facility] Lumbar spine (L1-L3) Femoral neck (FN) 33% distal radius  T-score + 0.5 RFN:- 0.1 LFN:+ 0.5 n/a   Reviewed pertinent labs: Her labs normalized (except 17 hydroxyprogesterone, which remained high) after he decrease the dose of her hydrocortisone. Component     Latest Ref Rng & Units 07/17/2017  Testosterone, Serum (Total)     ng/dL 49  % Free Testosterone     % 1.1  Free Testosterone, S     pg/mL 5.4  Sex Hormone Binding Globulin     nmol/L 54.8  DHEA-Sulfate, LCMS     ug/dL 14  78-GN-[id number]-OH-Progesterone, LC/MS/MS     ng/dL 696263  [id number] ACTH     6 - 50 pg/mL 9   Previously: Component     Latest Ref Rng & Units 02/07/2016  Testosterone     3 - 41 ng/dL 37  Sex Horm Binding Glob, Serum     17.3 - 125.0 nmol/L 46.7  Testosterone Free     0.0 - 4.2 pg/mL 1.8  ALDOSTERONE     0.0 - 30.0 ng/dL 1.0  Renin     8.4130.167 - 5.380 ng/mL/hr 1.048  ALDOS/RENIN RATIO     0.0 - 30.0 1.0  Potassium  3.5 - 5.1 mEq/L 3.9  DHEA-SO4     29.4 - 220.5 ug/dL 16.1 (L)  09-[id number]     ng/dL 213  ACTH     7.2 - 08.6 pg/mL 9.2   Component     Latest Ref Rng & Units 08/06/2014 02/07/2015  Testosterone     10 - 70 ng/dL 66   Sex Horm Binding Glob, Serum     14 - 73 nmol/L 25   Testosterone Free     0.6 - 6.8 pg/mL 13.9 (H)   Testosterone-% Free     0.4 - 2.4 % 2.1   PRA LC/MS/MS     0.25 - 5.82 ng/mL/h 1.67 5.73  ALDO / PRA Ratio     0.9 - 28.9 Ratio 1.8 1.6  ALDOSTERONE     ng/dL 3 9  DHEA     578 - 4,696 ng/dL 27 (L)   Androstenedione     ng/dL 10   29-BM-WUXLKGMWNUUV, LC/MS/MS     ng/dL 2,536 (H) 6,440 (H)  H474 ACTH     6 - 50 pg/mL 78 (H) 57 (H)  Potassium     3.5 - 5.1 mEq/L  3.9  DHEA-SO4     8 - 188 ug/dL  26   Reviewed records from Dr. Sharl Ma: - Patient was previously on fludrocortisone, however, this rendered her hypertensive and  with edema >>  fludrocortisone was eventually stopped. Dr. Sharl Ma mentioned that she is on a high dose of steroids, and probably her many years of over replacement with steroids have caused her secondary adrenal insufficiency.  Labs from 2014 were reviewed: 09/24/2012: - 17 hydroxyprogesterone 6180 - Hemoglobin A1c 5.4% - Lipids: 176/94/40/126 - TSH 1.94 - LFTs normal - ACTH 201.7 - DHEAS 42.5 - Aldosterone <1, PRA 1.37  She also has vitamin D deficiency and was on ergocalciferol.  However, at last visit, she did not start 5000 units vitamin D3 yet.  Vitamin D level was still low.  I again advised her to start the supplement. She did since last OV.  Lab Results  Component Value Date   VD25OH 19.27 (L) 07/17/2017   VD25OH 16.22 (L) 02/07/2016   VD25OH 11.32 (L) 02/07/2015   She c/o CP >> did not see cardiology but is considering this.  She goes to the gym consistently - [employer].  ROS: Constitutional: no weight gain/no weight loss, no fatigue, no subjective hyperthermia, no subjective hypothermia Eyes: no blurry vision, no xerophthalmia ENT: no sore throat, no nodules palpated in neck, no dysphagia, no odynophagia, no hoarseness Cardiovascular: no CP/no SOB/no palpitations/no leg swelling Respiratory: no cough/no SOB/no wheezing Gastrointestinal: no N/no V/no D/no C/no acid reflux Musculoskeletal: no muscle aches/no joint aches Skin: no rashes, no hair loss Neurological: no tremors/no numbness/no tingling/no dizziness  I reviewed pt's medications, allergies, PMH, social hx, family hx, and changes were documented in the history of present illness. Otherwise, unchanged from my initial visit note.   Past Medical History:  Diagnosis Date  . Adrenal hyperplasia (HCC)    Past surgical history: - C sections  History   Social History  . Marital Status: Married    Spouse Name: [name]    Number of Children: 4   Occupational History  .  nurse tech    Social History Main  Topics  . Smoking status: Never Smoker   . Smokeless tobacco: Not on file  . Alcohol Use: No  . Drug Use: No   Current Outpatient Medications on File Prior to  Visit  Medication Sig Dispense Refill  . celecoxib (CELEBREX) 200 MG capsule Take 200 mg by mouth daily.    Katheran James. Ginsengs-Royal Jelly (GINSENG COMPLEX/ROYAL JELLY PO) Take 800 mg by mouth 2 (two) times daily.    . hydrocortisone (CORTEF) 10 MG tablet Take 20 mg in am and 10 mg in pm 270 tablet 3  . omeprazole (PRILOSEC) 20 MG capsule Take 1 capsule (20 mg total) by mouth daily. 30 capsule 3  . vitamin B-12 (CYANOCOBALAMIN) 500 MCG tablet Take 500 mcg by mouth daily.     No current facility-administered medications on file prior to visit.    No Known Allergies   No family history of diabetes, hypertension, hyperlipidemia, heart disease, cancer.   PE: BP 132/80   Pulse 88   Ht 5' 0.5" (1.537 m)   Wt 218 lb (98.9 kg)   SpO2 98%   BMI 41.87 kg/m  Body mass index is 41.87 kg/m.  Wt Readings from Last 3 Encounters:  07/17/18 218 lb (98.9 kg)  07/17/17 229 lb (103.9 kg)  07/17/17 229 lb 9.6 oz (104.1 kg)   Constitutional: Obese, in NAD, + increased supraclavicular fat pads Eyes: PERRLA, EOMI, no exophthalmos ENT: moist mucous membranes, no thyromegaly, no cervical lymphadenopathy Cardiovascular: RRR, No MRG Respiratory: CTA B Gastrointestinal: abdomen soft, NT, ND, BS+ Musculoskeletal: no deformities, strength intact in all 4 Skin: moist, warm, no rashes, + hirsutism on chin Neurological: no tremor with outstretched hands, DTR normal in all 4  ASSESSMENT: 1. Nonclassical CAH - CAH = a condition in which the adrenal glands cannot produce enough cortisol and the hormone production is redirected towards androgen production via increased ACTH. Therefore, we tx this with glucocorticoids not only to treat the adrenal insufficiency, but also to decrease the ACTH and, therefore, adrenal androgens. OCPs also help in reducing the  androgens.   2. Adrenal insufficiency - Associated with her CAH   3. Vitamin D deficiency  PLAN:  1. And 2.  Patient has a history of lifelong CAH, most likely nonclassical, with adrenal insufficiency.  She did not have to have vaginoplasty and she was successful in getting pregnant with 4 children. -She has no sodium or potassium abnormalities, no hypotension.  She is not orthostatic and does not have salt craving.  Therefore, no fludrocortisone is needed.  She actually tried the medication in the past and developed hypotension and edema.  Hydrocortisone has some mineralocorticoid activity depending on the dose and she is not on a low salt diet, so for now, we will continue only with hydrocortisone. -She was on much higher doses of hydrocortisone in the past,then on 20 mg daily.  She only takes 10 mg in the p.m. once a week and she feels that this is enough.  However, last month, she had a right gaze and she increased her hydrocortisone dose to 50 mg daily.  After resolution of the episode, she changed to 30 mg daily (20+10) and she would like to stay on this regimen.  No increased fatigue or dizziness.  No unintentional weight loss. -We reviewed together the labs from last visit and they were normal with the exception of 17 hydroxyprogesterone which is usually high with CAH regardless of the treatment. -We again discussed the importance of using the lowest dose of hydrocortisone possible to control her symptoms to avoid possible side effects of chronic steroid treatment. -Of note, we checked another bone density scan after last visit and this showed normal scores -At this visit we  will recheck 17 hydroxyprogesterone, DHEAS, ACTH, and testosterone. - I suggested to: Patient Instructions  Please continue: Hydrocortisone 20 mg in a.m. and 10 mg in p.m.   - You absolutely need to take this medication every day and not skip doses. - Please double the dose if you have a fever, for the duration of  the fever. - If you cannot take anything by mouth (vomiting) or you have severe diarrhea so that you eliminate the hydrocortisone pills in your stool, please make sure that you get the hydrocortisone in the vein instead - go to the nearest emergency department/urgent care or you may go to your PCPs office   Please stop at the lab.  Please come back for a follow-up appointment in 1 year.  3. Vitamin D deficiency -Vitamin D level was low at last visit that she was not taking her supplement as prescribed >> now started -Recheck vitamin D today -Continue 5000 units vitamin D daily  Component     Latest Ref Rng & Units 07/17/2018  Testosterone, Serum (Total)     ng/dL 61 (H)  % Free Testosterone     % 1.2  Free Testosterone, S     pg/mL 7.3 (H)  Sex Hormone Binding Globulin     nmol/L 53.7  DHEA-Sulfate, LCMS     ug/dL 11  16-XW-[id number], LC/MS/MS     ng/dL 1,478  G956 ACTH     6 - 50 pg/mL 32  VITD     30.00 - 100.00 ng/mL 34.12   Labs are normal with the exception of slightly high free testosterone.  Also, 17 hydroxyprogesterone is high, which is expected.  [name], MD PhD [facility] Endocr[facility]ology

## 2018-07-17 NOTE — Patient Instructions (Addendum)
Please continue: Hydrocortisone 20 mg in a.m. and 10 mg in p.m.   - You absolutely need to take this medication every day and not skip doses. - Please double the dose if you have a fever, for the duration of the fever. - If you cannot take anything by mouth (vomiting) or you have severe diarrhea so that you eliminate the hydrocortisone pills in your stool, please make sure that you get the hydrocortisone in the vein instead - go to the nearest emergency department/urgent care or you may go to your PCPs office   Please stop at the lab.  Please come back for a follow-up appointment in 1 year.

## 2018-07-21 LAB — ACTH: C206 ACTH: 32 pg/mL (ref 6–50)

## 2018-07-21 LAB — 17-HYDROXYPROGESTERONE: 17-OH-PROGESTERONE, LC/MS/MS: 1342 ng/dL

## 2018-07-26 LAB — TESTOSTERONE, FREE AND TOTAL (INCLUDES SHBG)-(MALES)
% Free Testosterone: 1.2 %
Free Testosterone, S: 7.3 pg/mL — ABNORMAL HIGH
SEX HORMONE BINDING GLOBULIN: 53.7 nmol/L
Testosterone, Serum (Total): 61 ng/dL — ABNORMAL HIGH

## 2018-07-26 LAB — DHEA-SULFATE, SERUM: DHEA-Sulfate, LCMS: 11 ug/dL

## 2018-08-08 DIAGNOSIS — H15003 Unspecified scleritis, bilateral: Secondary | ICD-10-CM | POA: Diagnosis not present

## 2018-08-20 DIAGNOSIS — E250 Congenital adrenogenital disorders associated with enzyme deficiency: Secondary | ICD-10-CM | POA: Diagnosis not present

## 2018-08-20 DIAGNOSIS — Z6841 Body Mass Index (BMI) 40.0 and over, adult: Secondary | ICD-10-CM | POA: Diagnosis not present

## 2018-08-20 DIAGNOSIS — Z8249 Family history of ischemic heart disease and other diseases of the circulatory system: Secondary | ICD-10-CM | POA: Diagnosis not present

## 2018-08-20 DIAGNOSIS — Z9981 Dependence on supplemental oxygen: Secondary | ICD-10-CM | POA: Diagnosis not present

## 2018-08-20 DIAGNOSIS — Z7722 Contact with and (suspected) exposure to environmental tobacco smoke (acute) (chronic): Secondary | ICD-10-CM | POA: Diagnosis not present

## 2018-11-28 ENCOUNTER — Telehealth: Payer: Self-pay | Admitting: Internal Medicine

## 2018-11-28 DIAGNOSIS — E250 Congenital adrenogenital disorders associated with enzyme deficiency: Secondary | ICD-10-CM

## 2018-11-28 MED ORDER — HYDROCORTISONE 10 MG PO TABS: ORAL_TABLET | ORAL | 3 refills | Status: DC

## 2018-11-28 NOTE — Telephone Encounter (Signed)
RX has been sent.

## 2018-11-28 NOTE — Telephone Encounter (Signed)
MEDICATION: hydrocortisone (CORTEF) 10 MG tablet  PHARMACY:  [facility]  IS THIS A 90 DAY SUPPLY : Yes  IS PATIENT OUT OF MEDICATION: Yes  IF NOT; HOW MUCH IS LEFT:   LAST APPOINTMENT DATE: @1 /16/2020  NEXT APPOINTMENT DATE:@Visit  date not found  DO WE HAVE YOUR PERMISSION TO LEAVE A DETAILED MESSAGE:  OTHER COMMENTS:  Patient stated to call ph # [phone] to refill her medication  **Let patient know to contact pharmacy at the end of the day to make sure medication is ready. **  ** Please notify patient to allow 48-72 hours to process**  **Encourage patient to contact the pharmacy for refills or they can request refills through [facility]**

## 2018-12-16 ENCOUNTER — Encounter: Payer: Self-pay | Admitting: Internal Medicine

## 2018-12-16 ENCOUNTER — Other Ambulatory Visit: Payer: Self-pay | Admitting: Internal Medicine

## 2018-12-16 DIAGNOSIS — E250 Congenital adrenogenital disorders associated with enzyme deficiency: Secondary | ICD-10-CM

## 2018-12-16 MED ORDER — HYDROCORTISONE 10 MG PO TABS: ORAL_TABLET | ORAL | 3 refills | Status: DC

## 2018-12-22 ENCOUNTER — Telehealth: Payer: Self-pay | Admitting: Internal Medicine

## 2018-12-22 DIAGNOSIS — E250 Congenital adrenogenital disorders associated with enzyme deficiency: Secondary | ICD-10-CM

## 2018-12-22 NOTE — Telephone Encounter (Signed)
Patient called is stating that her RX for Hydrocortisone 10 MG for 360 pills is correct but the dosing instructions are incorrect.  States she should be getting 360 pills (90-day supply) and the per her last visit with Dr Cruzita Lederer she should be taking them 2 in the morning and 2 in the afternoon

## 2018-12-23 NOTE — Telephone Encounter (Signed)
Please review Rx and advise 

## 2018-12-24 NOTE — Telephone Encounter (Signed)
That was based on the recommended doses.  Please ask her to try to decrease the dose to 20 mg in a.m. and 10 mg in the p.m. to see how she does.  I would not want her to take more than 30 mg daily, if possible, due to the risk of side effects.  If she runs out of the medication sooner, we can call it in again.

## 2018-12-26 MED ORDER — HYDROCORTISONE 10 MG PO TABS: ORAL_TABLET | ORAL | 4 refills | Status: DC

## 2018-12-26 NOTE — Telephone Encounter (Signed)
Noted - please re-send to her pharmacy the same amount  mentioning 30-40 mg daily

## 2018-12-26 NOTE — Telephone Encounter (Signed)
I let patient know message from Dr. Cruzita Lederer.  Patient is very upset with this, she states if she is on the lower amount she can not function, she goes into crisis and that it would be the equvilent of killing her.

## 2018-12-26 NOTE — Addendum Note (Signed)
Addended by: Cardell Peach I on: 12/26/2018 01:04 PM   Modules accepted: Orders

## 2018-12-27 ENCOUNTER — Encounter: Payer: Self-pay | Admitting: Internal Medicine

## 2019-07-21 ENCOUNTER — Encounter: Payer: Self-pay | Admitting: Internal Medicine

## 2019-07-21 ENCOUNTER — Ambulatory Visit (INDEPENDENT_AMBULATORY_CARE_PROVIDER_SITE_OTHER): Payer: Medicare HMO | Admitting: Internal Medicine

## 2019-07-21 DIAGNOSIS — E559 Vitamin D deficiency, unspecified: Secondary | ICD-10-CM | POA: Diagnosis not present

## 2019-07-21 DIAGNOSIS — E250 Congenital adrenogenital disorders associated with enzyme deficiency: Secondary | ICD-10-CM | POA: Diagnosis not present

## 2019-07-21 DIAGNOSIS — Z7952 Long term (current) use of systemic steroids: Secondary | ICD-10-CM | POA: Diagnosis not present

## 2019-07-21 MED ORDER — HYDROCORTISONE 10 MG PO TABS: ORAL_TABLET | ORAL | 4 refills | Status: DC

## 2019-07-21 NOTE — Patient Instructions (Signed)
Please continue Hydrocortisone 20 mg in a.m. and 10 mg in p.m.   - You absolutely need to take this medication every day and not skip doses. - Please double the dose if you have a fever, for the duration of the fever. - If you cannot take anything by mouth (vomiting) or you have severe diarrhea so that you eliminate the hydrocortisone pills in your stool, please make sure that you get the hydrocortisone in the vein instead - go to the nearest emergency department/urgent care or you may go to your PCPs office   Please come back for labs at your convenience - in am.  Please come back for a follow-up appointment in 1 year

## 2019-07-21 NOTE — Progress Notes (Addendum)
Patient ID: Tracy Larson, female   DOB: 07/01/1956, 64 y.o.   MRN: [id number]  Patient location: Home My location: Office Persons participating in the virtual visit: patient, provider  I connected with the patient on 07/21/19 at  1:53 PM EST by a video enabled telemedicine application and verified that I am speaking with the correct person.   I discussed the limitations of evaluation and management by telemedicine and the availability of in person appointments. The patient expressed understanding and agreed to proceed.   Details of the encounter are shown below.  HPI  Tracy Larson is a 64 y.o.-year-old female, initially referred by her PCP, Dr. Conley Rolls, now returning for follow-up for congenital adrenal hyperplasia (most likely non-classical form), adrenal insufficiency, and vitamin D deficiency. She has been followed by Dr. Sharl Ma, but had to switch dr's b/c outstanding bill with his office. Last visit with me 1 year ago.  Reviewed history: Pt. has been dx with adrenal insufficiency at birth and then 64 y/o - became pregnant (she had 4 children -no history of infertility).   She did not have to have vaginoplasty.  She tried fludrocortisone (but developed edema and hypertension and had to stop).  She has h/o adrenal crises:   1985   2005 - 2/2 flu [facility]).  She did not have genetic counseling at dx.  She is on replacement with hydrocortisone previously on high dose, up to 100 mg daily for 15 years when her children were growing up, then decreased to 50 mg daily for another 10 years, 40 mg daily for 16 years.  We decreased the dose and she tried several lower doses, currently on 20 mg in a.m. (at 6 am, then goes back to bed), then wakes up definitively at 12 pm >> changed to 20 mg in am and 10 mg in the pm - changed it in 06/2019.  She is aware she needs to increase the dose of glucocorticoid if she has fever and needs to get injectable steroids if she cannot take p.o.  She did  not have to do this since last visit. She had dizziness, N/V >> doubled the dose for 1 mo 1.5 years ago.  No significant weight gain, diabetes, hypertension, or fractures.  She does have hirsutism on chin, no acne.  No orthostasis.  Reviewed her previous DXA scan reports: Normal T-scores: 07/22/17 (Newborn) Lumbar spine L1-L4 Femoral neck (FN) 33% distal radius  T-score 1.6 RFN: 0.4 LFN: 0.3 n/a   [facility], 2013 Lumbar spine (L1-L3) Femoral neck (FN) 33% distal radius  T-score + 0.5 RFN:- 0.1 LFN:+ 0.5 n/a   Reviewed pertinent labs: Her labs normalized (except for the 17 hydroxyprogesterone), after she decrease the dose of hydrocortisone.  At last check, she had a slightly high testosterone level: Component     Latest Ref Rng & Units 07/17/2018  Testosterone, Serum (Total)     ng/dL 61 (H)  % Free Testosterone     % 1.2  Free Testosterone, S     pg/mL 7.3 (H)  Sex Hormone Binding Globulin     nmol/L 53.7  17-OH-Progesterone, LC/MS/MS     * ng/dL 6,644  I347 ACTH     6 - 50 pg/mL 32  DHEA-Sulfate, LCMS     ug/dL 11   Previously: Component     Latest Ref Rng & Units 07/17/2017  Testosterone, Serum (Total)     ng/dL 49  % Free Testosterone     % 1.1  Free Testosterone, S  pg/mL 5.4  Sex Hormone Binding Globulin     nmol/L 54.8  DHEA-Sulfate, LCMS     ug/dL 14  48-[id number], LC/MS/MS     ng/dL 675  Q492 ACTH     6 - 50 pg/mL 9   Component     Latest Ref Rng & Units 02/07/2016  Testosterone     3 - 41 ng/dL 37  Sex Horm Binding Glob, Serum     17.3 - 125.0 nmol/L 46.7  Testosterone Free     0.0 - 4.2 pg/mL 1.8  ALDOSTERONE     0.0 - 30.0 ng/dL 1.0  Renin     0.100 - 5.380 ng/mL/hr 1.048  ALDOS/RENIN RATIO     0.0 - 30.0 1.0  Potassium     3.5 - 5.1 mEq/L 3.9  DHEA-SO4     29.4 - 220.5 ug/dL 71.2 (L)  19-[id number]     ng/dL 407  ACTH     7.2 - 68.0 pg/mL 9.2   Component     Latest Ref Rng & Units 08/06/2014 02/07/2015   Testosterone     10 - 70 ng/dL 66   Sex Horm Binding Glob, Serum     14 - 73 nmol/L 25   Testosterone Free     0.6 - 6.8 pg/mL 13.9 (H)   Testosterone-% Free     0.4 - 2.4 % 2.1   PRA LC/MS/MS     0.25 - 5.82 ng/mL/h 1.67 5.73  ALDO / PRA Ratio     0.9 - 28.9 Ratio 1.8 1.6  ALDOSTERONE     ng/dL 3 9  DHEA     881 - 1,031 ng/dL 27 (L)   Androstenedione     ng/dL 10   59-[id number], LC/MS/MS     ng/dL 3,817 (H) 7,116 (H)  F790 ACTH     6 - 50 pg/mL 78 (H) 57 (H)  Potassium     3.5 - 5.1 mEq/L  3.9  DHEA-SO4     8 - 188 ug/dL  26   Reviewed records from Dr. Sharl Ma: - Patient was previously on fludrocortisone, however, this rendered her hypertensive and with edema >>  fludrocortisone was eventually stopped. Dr. Sharl Ma mentioned that she is on a high dose of steroids, and probably her many years of over replacement with steroids have caused her secondary adrenal insufficiency.  Labs from 2014 were reviewed: 09/24/2012: - 17 hydroxyprogesterone 6180 - Hemoglobin A1c 5.4% - Lipids: 176/94/40/126 - TSH 1.94 - LFTs normal - ACTH 201.7 - DHEAS 42.5 - Aldosterone <1, PRA 1.37  Vitamin D deficiency  -Previously on ergocalciferol, on cholecalciferol 12,000 units daily - at last visit, but increased the dose as she was feeling lethargic >> now on 8000 IU.   Latest vitamin D level was normal at last visit: Lab Results  Component Value Date   VD25OH 34.12 07/17/2018   VD25OH 19.27 (L) 07/17/2017   VD25OH 16.22 (L) 02/07/2016   VD25OH 11.32 (L) 02/07/2015   Before the coronavirus pandemic, she was going to the gym consistently-Silver sneakers.  She was dx'ed with iritis before last visit. She was on 50 mg Hydrocortisone for the month of 06/2018 b/c of this, then decreased to 30 mg daily.  ROS: Constitutional: no weight gain/no weight loss, no fatigue, no subjective hyperthermia, no subjective hypothermia Eyes: no blurry vision, no xerophthalmia ENT: no sore throat, no  nodules palpated in neck, no dysphagia, no odynophagia, no hoarseness Cardiovascular: no CP/no SOB/no palpitations/no leg swelling Respiratory: no  cough/no SOB/no wheezing Gastrointestinal: no N/no V/no D/no C/no acid reflux Musculoskeletal: no muscle aches/no joint aches Skin: no rashes, no hair loss Neurological: no tremors/no numbness/no tingling/no dizziness  I reviewed pt's medications, allergies, PMH, social hx, family hx, and changes were documented in the history of present illness. Otherwise, unchanged from my initial visit note.   Past Medical History:  Diagnosis Date  . Adrenal hyperplasia (HCC)    Past surgical history: - C sections  History   Social History  . Marital Status: Married    Spouse Name: [name]    Number of Children: 4   Occupational History  .  nurse tech    Social History Main Topics  . Smoking status: Never Smoker   . Smokeless tobacco: Not on file  . Alcohol Use: No  . Drug Use: No   Current Outpatient Medications on File Prior to Visit  Medication Sig Dispense Refill  . Ginsengs-Royal Jelly (GINSENG COMPLEX/ROYAL JELLY PO) Take 800 mg by mouth 2 (two) times daily.    . hydrocortisone (CORTEF) 10 MG tablet Take 30 MG to 40 MG daily. 360 tablet 4  . vitamin B-12 (CYANOCOBALAMIN) 500 MCG tablet Take 500 mcg by mouth daily.     No current facility-administered medications on file prior to visit.   No Known Allergies   No family history of diabetes, hypertension, hyperlipidemia, heart disease, cancer.   PE: There were no vitals taken for this visit. There is no height or weight on file to calculate BMI.  Wt Readings from Last 3 Encounters:  07/17/18 218 lb (98.9 kg)  07/17/17 229 lb (103.9 kg)  07/17/17 229 lb 9.6 oz (104.1 kg)   Constitutional:  in NAD  The physical exam was not performed (virtual visit).  ASSESSMENT: 1. Nonclassical CAH - CAH = a condition in which the adrenal glands cannot produce enough cortisol and the hormone  production is redirected towards androgen production via increased ACTH. Therefore, we tx this with glucocorticoids not only to treat the adrenal insufficiency, but also to decrease the ACTH and, therefore, adrenal androgens. OCPs also help in reducing the androgens.   2. Adrenal insufficiency - Associated with her CAH   3. Vitamin D deficiency  PLAN:  1. And 2.  Patient has a history of lifelong CAH, most likely nonclassical, with adrenal insufficiency. She did not have to have vaginoplasty and she was successful in getting pregnant with 4 children. -She does not have sodium and potassium abnormalities, no hypotension.  She usually is not orthostatic and does not have salt craving.  No fludrocortisone is, therefore, needed.  She actually tried the medication in the past and developed hypertension and edema.  She is using hydrocortisone usually at higher doses as recommended as she does not feel well at physiologic doses.  The one positive outcome of this practice is that at higher doses, hydrocortisone has mineralocorticoid activity. -She was on much higher doses of hydrocortisone in the past, then on 20 mg daily. She is currently on 30 mg a day.  No dizziness, headaches, abdominal pain, orthostasis. -We again discussed the importance of using the lowest dose of hydrocortisone possible to control her symptoms and avoid possible side effects of chronic steroid treatment -Latest bone density scan was reviewed and it showed normal T-scores >> we discussed about repeating it but for now, during the coronavirus pandemic, will wait decided to hold off until next visit -When she returns to the clinic we will check a 17 hydroxyprogesterone, DHEA-S, ACTH,  and testosterone - refilled her hydrocortisone prescription - I suggested to: Patient Instructions  Please continue Hydrocortisone 20 mg in a.m. and 10 mg in p.m.   - You absolutely need to take this medication every day and not skip doses. - Please  double the dose if you have a fever, for the duration of the fever. - If you cannot take anything by mouth (vomiting) or you have severe diarrhea so that you eliminate the hydrocortisone pills in your stool, please make sure that you get the hydrocortisone in the vein instead - go to the nearest emergency department/urgent care or you may go to your PCPs office   Please come back for labs at your convenience  Please come back for a follow-up appointment in 1 year  3. Vitamin D deficiency -She decreased the dose of her vitamin D from 12,000 units daily (at last visit, I was not aware if she is taking this much, my understanding was that she was taking only 5000 units) >> currently on 8000 units vitamin D daily. -We will recheck her vitamin D when she can return to the clinic -Latest level was normal at last visit  Orders Placed This Encounter  Procedures  . ACTH  . 17-Hydroxyprogesterone  . VITAMIN D 25 Hydroxy (Vit-D Deficiency, Fractures)  . Testosterone Free with SHBG  . DHEA-Sulfate, Serum   Component     Latest Ref Rng & Units 08/03/2019  Testosterone, Serum (Total)     ng/dL 83 (H)  % Free Testosterone     % 1.4  Free Testosterone, S     pg/mL 12 (H)  Sex Hormone Binding Globulin     nmol/L 42.2  C206 ACTH     6 - 50 pg/mL 261 (H)  17-OH-Progesterone, LC/MS/MS     * ng/dL 5,790  VITD     30.00 - 100.00 ng/mL 23.86 (L)  DHEA-Sulfate, LCMS     ug/dL 17   Vitamin D is low apparently on 8000 units vitamin D daily.  We will increase this to 10,000 units daily.  We will need to recheck the level in 2 months Patient's testosterone is slightly higher. ACTH is also high with a normal DHEA-S, which is unusual...  I would like to repeat the test in 2 months when she returns for her vitamin D, but I do not feel we need to change her regimen at this point.  We will check with her if she is not getting testosterone by transfer.  Philemon Kingdom, MD PhD [facility] Endocrinology

## 2019-07-30 ENCOUNTER — Other Ambulatory Visit: Payer: TRICARE For Life (TFL)

## 2019-08-03 ENCOUNTER — Other Ambulatory Visit: Payer: Self-pay

## 2019-08-03 ENCOUNTER — Other Ambulatory Visit (INDEPENDENT_AMBULATORY_CARE_PROVIDER_SITE_OTHER): Payer: Medicare HMO

## 2019-08-03 DIAGNOSIS — E559 Vitamin D deficiency, unspecified: Secondary | ICD-10-CM | POA: Diagnosis not present

## 2019-08-03 DIAGNOSIS — E250 Congenital adrenogenital disorders associated with enzyme deficiency: Secondary | ICD-10-CM

## 2019-08-03 LAB — VITAMIN D 25 HYDROXY (VIT D DEFICIENCY, FRACTURES): VITD: 23.86 ng/mL — ABNORMAL LOW (ref 30.00–100.00)

## 2019-08-07 ENCOUNTER — Encounter: Payer: Self-pay | Admitting: Internal Medicine

## 2019-08-09 LAB — ACTH: C206 ACTH: 261 pg/mL — ABNORMAL HIGH (ref 6–50)

## 2019-08-09 LAB — TESTOSTERONE, FREE AND TOTAL (INCLUDES SHBG)-(MALES)
% Free Testosterone: 1.4 %
Free Testosterone, S: 12 pg/mL — ABNORMAL HIGH
Sex Hormone Binding Globulin: 42.2 nmol/L
Testosterone, Serum (Total): 83 ng/dL — ABNORMAL HIGH

## 2019-08-09 LAB — 17-HYDROXYPROGESTERONE: 17-OH-Progesterone, LC/MS/MS: 5790 ng/dL

## 2019-08-09 LAB — DHEA-SULFATE, SERUM: DHEA-Sulfate, LCMS: 17 ug/dL

## 2019-08-11 NOTE — Addendum Note (Signed)
Addended by: [name] on: 08/11/2019 03:50 PM   Modules accepted: Orders

## 2019-09-08 ENCOUNTER — Encounter: Payer: Self-pay | Admitting: Internal Medicine

## 2019-09-09 ENCOUNTER — Other Ambulatory Visit: Payer: Self-pay | Admitting: Internal Medicine

## 2019-09-09 ENCOUNTER — Encounter: Payer: Self-pay | Admitting: Internal Medicine

## 2019-09-09 ENCOUNTER — Telehealth: Payer: Self-pay

## 2019-09-09 MED ORDER — EFLORNITHINE HCL 13.9 % EX CREA: TOPICAL_CREAM | CUTANEOUS | 3 refills | Status: DC

## 2019-09-09 NOTE — Telephone Encounter (Signed)
Noted  

## 2019-09-09 NOTE — Telephone Encounter (Signed)
Insurance does not cover Vaniqa as it is considered to be cosmetic and not medically necessary.

## 2019-09-09 NOTE — Telephone Encounter (Signed)
I am not sure if this is terribly expensive.  The patient may have already picked it up.Marland KitchenMarland Kitchen

## 2019-09-16 ENCOUNTER — Other Ambulatory Visit: Payer: Medicare HMO

## 2019-12-09 ENCOUNTER — Other Ambulatory Visit: Payer: Self-pay | Admitting: Adult Medicine

## 2019-12-09 ENCOUNTER — Inpatient Hospital Stay: Admission: RE | Admit: 2019-12-09 | Payer: Medicare HMO | Source: Ambulatory Visit

## 2019-12-09 DIAGNOSIS — R0600 Dyspnea, unspecified: Secondary | ICD-10-CM | POA: Diagnosis not present

## 2019-12-09 DIAGNOSIS — E785 Hyperlipidemia, unspecified: Secondary | ICD-10-CM | POA: Diagnosis not present

## 2019-12-09 DIAGNOSIS — W19XXXA Unspecified fall, initial encounter: Secondary | ICD-10-CM

## 2019-12-09 DIAGNOSIS — Z13220 Encounter for screening for lipoid disorders: Secondary | ICD-10-CM | POA: Diagnosis not present

## 2019-12-09 DIAGNOSIS — M25551 Pain in right hip: Secondary | ICD-10-CM | POA: Diagnosis not present

## 2019-12-09 DIAGNOSIS — Z131 Encounter for screening for diabetes mellitus: Secondary | ICD-10-CM | POA: Diagnosis not present

## 2019-12-09 DIAGNOSIS — M25552 Pain in left hip: Secondary | ICD-10-CM | POA: Diagnosis not present

## 2019-12-09 DIAGNOSIS — E250 Congenital adrenogenital disorders associated with enzyme deficiency: Secondary | ICD-10-CM | POA: Diagnosis not present

## 2019-12-09 DIAGNOSIS — R5383 Other fatigue: Secondary | ICD-10-CM | POA: Diagnosis not present

## 2019-12-09 DIAGNOSIS — Z0000 Encounter for general adult medical examination without abnormal findings: Secondary | ICD-10-CM | POA: Diagnosis not present

## 2019-12-10 DIAGNOSIS — S0990XA Unspecified injury of head, initial encounter: Secondary | ICD-10-CM | POA: Diagnosis not present

## 2019-12-12 DIAGNOSIS — E250 Congenital adrenogenital disorders associated with enzyme deficiency: Secondary | ICD-10-CM | POA: Diagnosis not present

## 2019-12-12 DIAGNOSIS — R9431 Abnormal electrocardiogram [ECG] [EKG]: Secondary | ICD-10-CM | POA: Diagnosis not present

## 2019-12-12 DIAGNOSIS — R0600 Dyspnea, unspecified: Secondary | ICD-10-CM | POA: Diagnosis not present

## 2019-12-18 DIAGNOSIS — E785 Hyperlipidemia, unspecified: Secondary | ICD-10-CM | POA: Diagnosis not present

## 2019-12-18 DIAGNOSIS — Z0189 Encounter for other specified special examinations: Secondary | ICD-10-CM | POA: Diagnosis not present

## 2019-12-18 DIAGNOSIS — Z711 Person with feared health complaint in whom no diagnosis is made: Secondary | ICD-10-CM | POA: Diagnosis not present

## 2019-12-18 DIAGNOSIS — E250 Congenital adrenogenital disorders associated with enzyme deficiency: Secondary | ICD-10-CM | POA: Diagnosis not present

## 2019-12-22 ENCOUNTER — Other Ambulatory Visit: Payer: Self-pay | Admitting: Adult Medicine

## 2019-12-22 DIAGNOSIS — Z1231 Encounter for screening mammogram for malignant neoplasm of breast: Secondary | ICD-10-CM

## 2019-12-28 DIAGNOSIS — M533 Sacrococcygeal disorders, not elsewhere classified: Secondary | ICD-10-CM | POA: Diagnosis not present

## 2019-12-28 DIAGNOSIS — M25552 Pain in left hip: Secondary | ICD-10-CM | POA: Diagnosis not present

## 2019-12-28 DIAGNOSIS — Z6841 Body Mass Index (BMI) 40.0 and over, adult: Secondary | ICD-10-CM | POA: Diagnosis not present

## 2020-01-08 DIAGNOSIS — M545 Low back pain: Secondary | ICD-10-CM | POA: Diagnosis not present

## 2020-01-10 ENCOUNTER — Other Ambulatory Visit: Payer: Self-pay | Admitting: Internal Medicine

## 2020-01-10 DIAGNOSIS — E250 Congenital adrenogenital disorders associated with enzyme deficiency: Secondary | ICD-10-CM

## 2020-01-15 DIAGNOSIS — M5136 Other intervertebral disc degeneration, lumbar region: Secondary | ICD-10-CM | POA: Diagnosis not present

## 2020-03-31 ENCOUNTER — Other Ambulatory Visit: Payer: Self-pay

## 2020-03-31 ENCOUNTER — Encounter: Payer: Self-pay | Admitting: Internal Medicine

## 2020-03-31 ENCOUNTER — Telehealth: Payer: Self-pay

## 2020-03-31 ENCOUNTER — Ambulatory Visit (INDEPENDENT_AMBULATORY_CARE_PROVIDER_SITE_OTHER): Payer: Medicare HMO | Admitting: Internal Medicine

## 2020-03-31 VITALS — BP 112/70 | HR 88 | Ht 60.5 in | Wt 224.0 lb

## 2020-03-31 DIAGNOSIS — E250 Congenital adrenogenital disorders associated with enzyme deficiency: Secondary | ICD-10-CM | POA: Diagnosis not present

## 2020-03-31 DIAGNOSIS — Z7952 Long term (current) use of systemic steroids: Secondary | ICD-10-CM

## 2020-03-31 DIAGNOSIS — E559 Vitamin D deficiency, unspecified: Secondary | ICD-10-CM | POA: Diagnosis not present

## 2020-03-31 LAB — VITAMIN D 25 HYDROXY (VIT D DEFICIENCY, FRACTURES): VITD: 27.34 ng/mL — ABNORMAL LOW (ref 30.00–100.00)

## 2020-03-31 MED ORDER — "SYRINGE 23G X 1"" 3 ML MISC": 1 refills | Status: DC

## 2020-03-31 MED ORDER — HYDROCORTISONE NA SUCCINATE PF 100 MG IJ SOLR: INTRAMUSCULAR | 99 refills | Status: DC

## 2020-03-31 MED ORDER — SALINE BACTERIOSTATIC 0.9 % IJ SOLN: 1.0000 mL | INTRAMUSCULAR | 3 refills | Status: DC | PRN

## 2020-03-31 NOTE — Patient Instructions (Addendum)
Please continue Hydrocortisone 20 mg in a.m. and 15 mg in p.m. for now  Please let your PCP know about your symptoms.  - You absolutely need to take this medication every day and not skip doses. - Please double the dose if you have a fever, for the duration of the fever. - If you cannot take anything by mouth (vomiting) or you have severe diarrhea so that you eliminate the hydrocortisone pills in your stool, please make sure that you get the hydrocortisone in the vein instead - at home or go to the nearest emergency department/urgent care or you may go to your PCPs office   Please stop at the lab.  Please come back for a follow-up appointment in 1 year.

## 2020-03-31 NOTE — Progress Notes (Addendum)
Patient ID: Tracy Larson, female   DOB: [birth year], 64 y.o.   MRN: [id number]  This visit occurred during the SARS-CoV-2 public health emergency.  Safety protocols were in place, including screening questions prior to the visit, additional usage of staff PPE, and extensive cleaning of exam room while observing appropriate contact time as indicated for disinfecting solutions.   HPI  Tracy Larson is a 64 y.o.-year-old female, initially referred by her PCP, Dr. Conley Rolls, returning for follow-up for congenital adrenal hyperplasia (most likely non-classical form), adrenal insufficiency, and vitamin D deficiency. She has been followed by Dr. Sharl Ma, but had to switch dr's b/c outstanding bill with his office. Last visit with me 8 months ago (virtual). PCP: Dr Kirtland Bouchard (?)  [facility]).  She started to have AP ("contractions", like gas, similar to when she was 64 y/o) - in 08/2019, also diarrhea, sharp HAs.  Also, some dizziness.  She had an absence seizure episode 2 weeks ago (I was "dazed" - woke up when sprinkled with water)- witnessed by daughter.  Reviewed and addended history: Pt. has been dx with adrenal insufficiency at birth and then 64 y/o - became pregnant (she had 4 children -no history of infertility).   She did not have to have vaginoplasty.  She tried fludrocortisone (but developed edema and hypertension and had to stop).  She has h/o adrenal crises:   1985   2005 - 2/2 flu [facility]).  She did not have genetic counseling at dx.  She is on replacement with hydrocortisone previously on high dose, up to 100 mg daily for 15 years when her children were growing up, then decreased to 50 mg daily for another 10 years, 40 mg daily for 16 years.  We decreased the dose and she tried several lower doses, currently on 20 mg in a.m. (at 6 am, then goes back to bed), then wakes up definitively at 12 pm >> changed to 20 mg in am and 10 >> 15 mg in the pm - changed it in 06/2019.  She  is aware she needs to increase the dose of glucocorticoid if she has fever and needs to get injectable steroids if she cannot take p.o.   No diabetes, hypertension, fractures.  She does have hirsutism on chin, no acne.  At last visit, we tried to start eflornithine cream. She started >> not working.  Reviewed her previous bone density scores and they were normal: 07/22/17 ([facility]) Lumbar spine L1-L4 Femoral neck (FN) 33% distal radius  T-score 1.6 RFN: 0.4 LFN: 0.3 n/a   [facility], 2013 Lumbar spine (L1-L3) Femoral neck (FN) 33% distal radius  T-score + 0.5 RFN:- 0.1 LFN:+ 0.5 n/a   Reviewed pertinent labs: Her labs initially normalized (except for the 17 hydroxy progesterone) after she decreased the dose of hydrocortisone.  However, her testosterone started to increase afterwards.  At last visit, her ACTH was also high.  I checked with her and she was not getting testosterone transfer.  Component     Latest Ref Rng & Units 08/03/2019  Testosterone, Serum (Total)     ng/dL 83 (H)  % Free Testosterone     % 1.4  Free Testosterone, S     pg/mL 12 (H)  Sex Hormone Binding Globulin     nmol/L 42.2  C206 ACTH     6 - 50 pg/mL 261 (H)  17-OH-Progesterone, LC/MS/MS     * ng/dL 4,098  DHEA-Sulfate, LCMS     ug/dL 17   Component  Latest Ref Rng & Units 07/17/2018  Testosterone, Serum (Total)     ng/dL 61 (H)  % Free Testosterone     % 1.2  Free Testosterone, S     pg/mL 7.3 (H)  Sex Hormone Binding Globulin     nmol/L 53.7  17-OH-Progesterone, LC/MS/MS     * ng/dL 4,098  J191 ACTH     6 - 50 pg/mL 32  DHEA-Sulfate, LCMS     ug/dL 11   Component     Latest Ref Rng & Units 07/17/2017  Testosterone, Serum (Total)     ng/dL 49  % Free Testosterone     % 1.1  Free Testosterone, S     pg/mL 5.4  Sex Hormone Binding Globulin     nmol/L 54.8  DHEA-Sulfate, LCMS     ug/dL 14  47-WG-[id number], LC/MS/MS     ng/dL 469  G295 ACTH     6 - 50 pg/mL 9    Component     Latest Ref Rng & Units 02/07/2016  Testosterone     3 - 41 ng/dL 37  Sex Horm Binding Glob, Serum     17.3 - 125.0 nmol/L 46.7  Testosterone Free     0.0 - 4.2 pg/mL 1.8  ALDOSTERONE     0.0 - 30.0 ng/dL 1.0  Renin     2.841 - 5.380 ng/mL/hr 1.048  ALDOS/RENIN RATIO     0.0 - 30.0 1.0  Potassium     3.5 - 5.1 mEq/L 3.9  DHEA-SO4     29.4 - 220.5 ug/dL 32.4 (L)  40-[id number]     ng/dL 563  ACTH     7.2 - 87.5 pg/mL 9.2   Component     Latest Ref Rng & Units 08/06/2014 02/07/2015  Testosterone     10 - 70 ng/dL 66   Sex Horm Binding Glob, Serum     14 - 73 nmol/L 25   Testosterone Free     0.6 - 6.8 pg/mL 13.9 (H)   Testosterone-% Free     0.4 - 2.4 % 2.1   PRA LC/MS/MS     0.25 - 5.82 ng/mL/h 1.67 5.73  ALDO / PRA Ratio     0.9 - 28.9 Ratio 1.8 1.6  ALDOSTERONE     ng/dL 3 9  DHEA     643 - 3,295 ng/dL 27 (L)   Androstenedione     ng/dL 10   18-AC-[id number], LC/MS/MS     ng/dL 9,323 (H) 5,573 (H)  U202 ACTH     6 - 50 pg/mL 78 (H) 57 (H)  Potassium     3.5 - 5.1 mEq/L  3.9  DHEA-SO4     8 - 188 ug/dL  26   Previous records from Dr. Sharl Ma: - Patient was previously on fludrocortisone, however, this rendered her hypertensive and with edema >>  fludrocortisone was eventually stopped. Dr. Sharl Ma mentioned that she is on a high dose of steroids, and probably her many years of over replacement with steroids have caused her secondary adrenal insufficiency.   09/24/2012: - 17 hydroxyprogesterone 6180 - Hemoglobin A1c 5.4% - Lipids: 176/94/40/126 - TSH 1.94 - LFTs normal - ACTH 201.7 - DHEAS 42.5 - Aldosterone <1, PRA 1.37  Vitamin D deficiency  -Previously on ergocalciferol, then 1 cholecalciferol 12,000 units daily (but she felt this was causing lethargy...) >> at last visit she was on 8000 IU >> we increased to 10,000 units daily.  At last visit vitamin D level  was still low: Lab Results  Component Value Date   VD25OH 23.86 (L)  08/03/2019   VD25OH 34.12 07/17/2018   VD25OH 19.27 (L) 07/17/2017   VD25OH 16.22 (L) 02/07/2016   VD25OH 11.32 (L) 02/07/2015   Before the coronavirus pandemic, she was going to the gym consistently-Silver sneakers.  She has a history of iritis. She was on 50 mg Hydrocortisone for the month of 06/2018 b/c of this, then decreased to 30 mg daily.  ROS: Constitutional: +weight gain/no weight loss, also, + see HPI Eyes: no blurry vision, no xerophthalmia ENT: no sore throat, no nodules palpated in neck, no dysphagia, no odynophagia, no hoarseness Cardiovascular: no CP/no SOB/no palpitations/no leg swelling Respiratory: no cough/no SOB/no wheezing Gastrointestinal: no N/no V/+ D/no C/no acid reflux Musculoskeletal: no muscle aches/no joint aches Skin: no rashes, no hair loss, + excess hair on chin and sideburns Neurological: no tremors/no numbness/no tingling/+ dizziness  I reviewed pt's medications, allergies, PMH, social hx, family hx, and changes were documented in the history of present illness. Otherwise, unchanged from my initial visit note.   Past Medical History:  Diagnosis Date  . Adrenal hyperplasia (HCC)    Past surgical history: - C sections  History   Social History  . Marital Status: Married    Spouse Name: [name]    Number of Children: 4   Occupational History  .  nurse tech    Social History Main Topics  . Smoking status: Never Smoker   . Smokeless tobacco: Not on file  . Alcohol Use: No  . Drug Use: No   Current Outpatient Medications on File Prior to Visit  Medication Sig Dispense Refill  . Eflornithine HCl 13.9 % cream Apply 2x a day on the affected skin 45 g 3  . Ginsengs-Royal Jelly (GINSENG COMPLEX/ROYAL JELLY PO) Take 800 mg by mouth 2 (two) times daily.    . hydrocortisone (CORTEF) 10 MG tablet TAKE 2 TABLETS BY MOUTH IN THE MORNING AND 1 IN THE EVENING 360 tablet 4   No current facility-administered medications on file prior to visit.   No  Known Allergies   No family history of diabetes, hypertension, hyperlipidemia, heart disease, cancer.   PE: BP 112/70   Pulse 88   Wt 224 lb (101.6 kg)   SpO2 99%   BMI 43.03 kg/m  Body mass index is 43.03 kg/m.  Wt Readings from Last 3 Encounters:  03/31/20 224 lb (101.6 kg)  07/17/18 218 lb (98.9 kg)  07/17/17 229 lb (103.9 kg)   Constitutional: overweight, in NAD Eyes: PERRLA, EOMI, no exophthalmos ENT: moist mucous membranes, no thyromegaly, no cervical lymphadenopathy Cardiovascular: RRR, No MRG Respiratory: CTA B Gastrointestinal: abdomen soft, NT, ND, BS+ Musculoskeletal: no deformities, strength intact in all 4 Skin: moist, warm, no rashes, + excess hair on chin and sideburns Neurological: no tremor with outstretched hands, DTR normal in all 4  ASSESSMENT: 1. Nonclassical CAH -In this condition, the adrenal glands cannot produce enough cortisol and the hormone production is redirected towards androgen production via increased ACTH. Therefore, we tx this with glucocorticoids not only to treat the adrenal insufficiency, but also to decrease the ACTH and, therefore, adrenal androgens. OCPs also help in reducing the androgens.   2. Adrenal insufficiency - Associated with her CAH   3. Vitamin D deficiency  PLAN:  1. And 2.  Patient with a lifelong history of CAH, most likely nonclassical, but with severe insufficiency.  She did not have to have vaginoplasty and she  was successful in getting pregnant with 4 children. -She does not have sodium and potassium abnormalities, no hypotension.  She is usually not orthostatic and did not have salt craving, but describes this today.  Also, she had some dizziness, abdominal pain, and diarrhea since 08/2019.  No weight loss, no weight gain.  She also describes an absence seizure at this visit, which happened 2 weeks ago and was witnessed by her daughter.  She was woken up by being strangled with water.  She has not been evaluated by  neurology yet.  Also, no evaluation by GI. -we do not have her home fludrocortisone.  She actually tried this in the past and developed hypertension and edema.  She is usually using hydrocortisone doses at higher doses than recommended in the past as she did not feel well at physiologic doses.  The only positive outcome of this practice is that at higher doses, hydrocortisone also has mineralocorticoid activity. -She continues on hydrocortisone 20 mg in a.m. and 15 mg in p.m. -She is aware about the need to use the lowest dose of hydrocortisone possible to control her same and avoid possible side effects of chronic steroid treatments -She has had normal bone density scores in the past.  We can wait a little bit longer before we repeat a new DXA scan -We reviewed together her previous labs from last visit: Testosterone was higher.  She was not getting it from transfer.  We did try to start eflornithine cream but this was not covered by her insurance. -An ACTH was also higher at last and we will repeat this today.  I explained that the higher ACTH is consistent with either too low of a dose of hydrocortisone or noncompliance with the doses.  If ACTH today is still high, we may need to increase her hydrocortisone dose. -Due to recent events, we discussed about when to use injectable hydrocortisone and I sent this to the pharmacy for her.  I also advised her to discuss with PCP to see neurology and possibly also GI.  Some of her symptoms may be due to dehydration.  She is on enough hydrocortisone to prevent an adrenal crisis. -I am going to refill her hydrocortisone prescription - I suggested to: Patient Instructions  Please continue Hydrocortisone 20 mg in a.m. and 15 mg in p.m. for now  Please let your PCP know about your symptoms.  - You absolutely need to take this medication every day and not skip doses. - Please double the dose if you have a fever, for the duration of the fever. - If you cannot  take anything by mouth (vomiting) or you have severe diarrhea so that you eliminate the hydrocortisone pills in your stool, please make sure that you get the hydrocortisone in the vein instead - go to the nearest emergency department/urgent care or you may go to your PCPs office   Please stop at the lab.  Please come back for a follow-up appointment in 1 year.  3. Vitamin D deficiency -At last visit, her vitamin D level was still low on 8000 units daily.  We increased this to 10,000 units daily, which she continues today -We will recheck her vitamin D level today  Needs a new prescription with a higher dose of hydrocortisone  when the results return.  Component     Latest Ref Rng & Units 03/31/2020  Testosterone, Serum (Total)     ng/dL 83 (H)  % Free Testosterone     % 1.0  Free Testosterone, S     pg/mL 8.3 (H)  Sex Hormone Binding Globulin     nmol/L 47.6  VITD     30.00 - 100.00 ng/mL 27.34 (L)  DHEA-Sulfate, LCMS     ug/dL 36  [id number], LC/MS/MS      ng/dL 0,630  Z601 ACTH     6 - 50 pg/mL 297 (H)   ACTH is still elevated >> we will continue with a higher dose of hydrocortisone, as above.  Free testosterone is slightly better.  DHEA is normal.  17 hydroxyprogesterone is not suppressed, which is great.  Her vitamin D level is slightly low.  She is on 10,000 units daily.  I would suggest to add a vitamin K2 for better absorption, but would continue the same dose of vitamin D level for now.   [name], MD PhD [facility]

## 2020-03-31 NOTE — Telephone Encounter (Signed)
Pt provided PCP business card for Dr. [name] to update chart.  [facility], [address] [place], [place] [address] ph [phone] & office [phone] & fax [phone]. Dr, Francisca December.

## 2020-04-05 LAB — ACTH: C206 ACTH: 297 pg/mL — ABNORMAL HIGH (ref 6–50)

## 2020-04-05 LAB — 17-HYDROXYPROGESTERONE: 17-OH-Progesterone, LC/MS/MS: 7435 ng/dL

## 2020-04-06 ENCOUNTER — Encounter: Payer: Self-pay | Admitting: Internal Medicine

## 2020-04-07 LAB — TESTOSTERONE, FREE AND TOTAL (INCLUDES SHBG)-(MALES)
% Free Testosterone: 1 %
Free Testosterone, S: 8.3 pg/mL — ABNORMAL HIGH
Sex Hormone Binding Globulin: 47.6 nmol/L
Testosterone, Serum (Total): 83 ng/dL — ABNORMAL HIGH

## 2020-04-07 LAB — DHEA-SULFATE, SERUM: DHEA-Sulfate, LCMS: 36 ug/dL

## 2020-07-29 ENCOUNTER — Other Ambulatory Visit: Payer: Self-pay

## 2020-07-29 ENCOUNTER — Encounter: Payer: Self-pay | Admitting: Internal Medicine

## 2020-07-29 ENCOUNTER — Ambulatory Visit (INDEPENDENT_AMBULATORY_CARE_PROVIDER_SITE_OTHER): Payer: Medicare HMO | Admitting: Internal Medicine

## 2020-07-29 VITALS — BP 120/82 | HR 83 | Ht 60.5 in | Wt 220.6 lb

## 2020-07-29 DIAGNOSIS — E250 Congenital adrenogenital disorders associated with enzyme deficiency: Secondary | ICD-10-CM | POA: Diagnosis not present

## 2020-07-29 DIAGNOSIS — E559 Vitamin D deficiency, unspecified: Secondary | ICD-10-CM | POA: Diagnosis not present

## 2020-07-29 DIAGNOSIS — E2740 Unspecified adrenocortical insufficiency: Secondary | ICD-10-CM

## 2020-07-29 NOTE — Patient Instructions (Signed)
Please try to reduce:  -Hydrocortisone 20 mg in a.m. and 15 mg in p.m.  - You absolutely need to take this medication every day and not skip doses. - Please double the dose if you have a fever, for the duration of the fever. - If you cannot take anything by mouth (vomiting) or you have severe diarrhea so that you eliminate the hydrocortisone pills in your stool, please make sure that you get the hydrocortisone in the vein instead - go to the nearest emergency department/urgent care or you may go to your PCPs office   Please stop at the lab.  Please come back for a follow-up appointment in 1 year.

## 2020-07-29 NOTE — Progress Notes (Addendum)
Patient ID: Tracy Larson, female   DOB: 02/14/1956, 65 y.o.   MRN: [id number]  This visit occurred during the SARS-CoV-2 public health emergency.  Safety protocols were in place, including screening questions prior to the visit, additional usage of staff PPE, and extensive cleaning of exam room while observing appropriate contact time as indicated for disinfecting solutions.   HPI  Tracy Larson is a 65 y.o.-year-old female, initially referred by her PCP, Dr. Conley Rolls, returning for follow-up for congenital adrenal hyperplasia (most likely non-classical form), adrenal insufficiency, and vitamin D deficiency. She has been followed by Dr. Sharl Ma, but had to switch dr's b/c outstanding bill with his office. Last visit with me 4 months ago. PCP: Dr Kirtland Bouchard (?)  [facility]).  At last visit, she described abdominal pain ("contractions", like gas, similar to when she was 65 y/o), also diarrhea, headaches, dizziness.  She also described an absence seizures 2 weeks prior to our last appointment, witnessed by daughter (I was "dazed" - woke up when sprinkled with water).  All these have now resolved and she has no complaints at this visit.  Since last visit, she hydrocortisone dose to 20 mg 2x a day, but is working on decreasing the dose.  Reviewed and addended history: Pt. has been dx with adrenal insufficiency at birth and then 64 y/o - became pregnant (she had 4 children -no history of infertility).   She did not have to have vaginoplasty.  She tried fludrocortisone (but developed edema and hypertension and had to stop).  She has h/o adrenal crises:   1985   2005 - 2/2 flu [facility]).  She did not have genetic counseling at dx.  She is on replacement with hydrocortisone previously on high dose, up to 100 mg daily for 15 years when her children were growing up, then decreased to 50 mg daily for another 10 years, 40 mg daily for 16 years.  We decreased the dose and she tried several lower  doses, currently on 20 mg in a.m. (at 6 am, then goes back to bed), then wakes up definitively at 12 pm >> changed to 20 mg in am and 10 >> 15 mg in the pm - changed it in 06/2019.  She is aware she needs to increase the dose of glucocorticoid if she has fever and needs to get injectable steroids if she cannot take p.o.   No diabetes, hypertension, fractures.  She does have hirsutism on chin but no acne.  We tried to start eflornithine cream but this was not covered  Previous bone density scores were normal: 07/22/17 ([place]) Lumbar spine L1-L4 Femoral neck (FN) 33% distal radius  T-score 1.6 RFN: 0.4 LFN: 0.3 n/a   [facility], 2013 Lumbar spine (L1-L3) Femoral neck (FN) 33% distal radius  T-score + 0.5 RFN:- 0.1 LFN:+ 0.5 n/a   Reviewed pertinent labs: Her labs initially normalized (except for the 17 hydroxy progesterone) after she decreased the dose of hydrocortisone.  However, her testosterone started to increase afterwards.  At last visit, her ACTH was also high.  I checked with her and she was not getting testosterone transfer.  Component     Latest Ref Rng & Units 03/31/2020  Testosterone, Serum (Total)     ng/dL 83 (H)  % Free Testosterone     % 1.0  Free Testosterone, S     pg/mL 8.3 (H)  Sex Hormone Binding Globulin     nmol/L 47.6  DHEA-Sulfate, LCMS     ug/dL 36  17-OH-Progesterone, LC/MS/MS      ng/dL 1,6107,435  [id number] ACTH     6 - 50 pg/mL 297 (H)   Component     Latest Ref Rng & Units 08/03/2019  Testosterone, Serum (Total)     ng/dL 83 (H)  % Free Testosterone     % 1.4  Free Testosterone, S     pg/mL 12 (H)  Sex Hormone Binding Globulin     nmol/L 42.2  C206 ACTH     6 - 50 pg/mL 261 (H)  17-OH-Progesterone, LC/MS/MS     * ng/dL 5,4095,790  DHEA-Sulfate, LCMS     ug/dL 17   Component     Latest Ref Rng & Units 07/17/2018  Testosterone, Serum (Total)     ng/dL 61 (H)  % Free Testosterone     % 1.2  Free Testosterone, S     pg/mL 7.3 (H)  Sex Hormone  Binding Globulin     nmol/L 53.7  17-OH-Progesterone, LC/MS/MS     * ng/dL 8,1191,342  [id number] ACTH     6 - 50 pg/mL 32  DHEA-Sulfate, LCMS     ug/dL 11   Component     Latest Ref Rng & Units 07/17/2017  Testosterone, Serum (Total)     ng/dL 49  % Free Testosterone     % 1.1  Free Testosterone, S     pg/mL 5.4  Sex Hormone Binding Globulin     nmol/L 54.8  DHEA-Sulfate, LCMS     ug/dL 14  29-FA-OZHYQMVHQION17-OH-Progesterone, LC/MS/MS     ng/dL 629263  [id number] ACTH     6 - 50 pg/mL 9   Component     Latest Ref Rng & Units 02/07/2016  Testosterone     3 - 41 ng/dL 37  Sex Horm Binding Glob, Serum     17.3 - 125.0 nmol/L 46.7  Testosterone Free     0.0 - 4.2 pg/mL 1.8  ALDOSTERONE     0.0 - 30.0 ng/dL 1.0  Renin     1.3240.167 - 5.380 ng/mL/hr 1.048  ALDOS/RENIN RATIO     0.0 - 30.0 1.0  Potassium     3.5 - 5.1 mEq/L 3.9  DHEA-SO4     29.4 - 220.5 ug/dL 40.126.4 (L)  02-VOZDGUYQIHKVQQVZDGL17-Hydroxyprogesterone     ng/dL 875374  ACTH     7.2 - 64.363.3 pg/mL 9.2   Component     Latest Ref Rng & Units 08/06/2014 02/07/2015  Testosterone     10 - 70 ng/dL 66   Sex Horm Binding Glob, Serum     14 - 73 nmol/L 25   Testosterone Free     0.6 - 6.8 pg/mL 13.9 (H)   Testosterone-% Free     0.4 - 2.4 % 2.1   PRA LC/MS/MS     0.25 - 5.82 ng/mL/h 1.67 5.73  ALDO / PRA Ratio     0.9 - 28.9 Ratio 1.8 1.6  ALDOSTERONE     ng/dL 3 9  DHEA     329102 - 5,1881,185 ng/dL 27 (L)   Androstenedione     ng/dL 10   41-YS-AYTKZSWFUXNA17-OH-Progesterone, LC/MS/MS     ng/dL 3,5572,014 (H) 3,2201,574 (H)  [id number] ACTH     6 - 50 pg/mL 78 (H) 57 (H)  Potassium     3.5 - 5.1 mEq/L  3.9  DHEA-SO4     8 - 188 ug/dL  26   70/62/376203/26/2014: - 17 hydroxyprogesterone 6180 - Hemoglobin A1c 5.4% - Lipids:  176/94/40/126 - TSH 1.94 - LFTs normal - ACTH 201.7 - DHEAS 42.5 - Aldosterone <1, PRA 1.37  Previous records from Dr. Sharl Ma: - Patient was previously on fludrocortisone, however, this rendered her hypertensive and with edema >>  fludrocortisone was eventually stopped. Dr. Sharl Ma  mentioned that she is on a high dose of steroids, and probably her many years of over replacement with steroids have caused her secondary adrenal insufficiency.   Vitamin D deficiency  -Previously on ergocalciferol, then 1 cholecalciferol 12,000 units daily (but she felt this was causing lethargy...) >> at last visit she was on 8000 IU >> we increased to 10,000 units daily.  At last visit we continued the same dose but I advised her to add K2 vitamin D has the vitamin D absorption.  At today's visit, however, she tells me she stopped the vitamin D supplement 3 mo 05/2020 and now takes a bone and joint supplement (will send me the ingredients).  At last visit, vitamin D level was still low, but improving Lab Results  Component Value Date   VD25OH 27.34 (L) 03/31/2020   VD25OH 23.86 (L) 08/03/2019   VD25OH 34.12 07/17/2018   VD25OH 19.27 (L) 07/17/2017   VD25OH 16.22 (L) 02/07/2016   VD25OH 11.32 (L) 02/07/2015   Before the coronavirus pandemic she was going to the gym consistently, doing Silver sneakers.   She has a history of iritis. She was on 50 mg Hydrocortisone for the month of 06/2018 b/c of this, then decreased to 30 mg daily.  ROS: Constitutional: no weight gain/no weight loss, no fatigue, no subjective hyperthermia, no subjective hypothermia Eyes: no blurry vision, no xerophthalmia ENT: no sore throat, no nodules palpated in neck, no dysphagia, no odynophagia, no hoarseness Cardiovascular: no CP/no SOB/no palpitations/no leg swelling Respiratory: no cough/no SOB/no wheezing Gastrointestinal: no N/no V/no D/no C/no acid reflux Musculoskeletal: no muscle aches/no joint aches Skin: no rashes, no hair loss, + excessive hair on chin -not bothersome Neurological: no tremors/no numbness/no tingling/no dizziness  I reviewed pt's medications, allergies, PMH, social hx, family hx, and changes were documented in the history of present illness. Otherwise, unchanged from my initial visit  note.   Past Medical History:  Diagnosis Date  . Adrenal hyperplasia (HCC)    Past surgical history: - C sections  History   Social History  . Marital Status: Married    Spouse Name: [name]    Number of Children: 4   Occupational History  .  nurse tech    Social History Main Topics  . Smoking status: Never Smoker   . Smokeless tobacco: Not on file  . Alcohol Use: No  . Drug Use: No   Current Outpatient Medications on File Prior to Visit  Medication Sig Dispense Refill  . Ascorbic Acid (VITAMIN C PO) Take 500 tablets by mouth in the morning and at bedtime.    Katheran James Jelly (GINSENG COMPLEX/ROYAL JELLY PO) Take 800 mg by mouth 2 (two) times daily.    . hydrocortisone (CORTEF) 10 MG tablet TAKE 2 TABLETS BY MOUTH IN THE MORNING AND 1 IN THE EVENING (Patient taking differently: TAKE 2 TABLETS BY MOUTH IN THE MORNING AND 1 and a half IN THE EVENING) 360 tablet 4  . hydrocortisone sodium succinate (SOLU-CORTEF) 100 MG SOLR injection Inject 100 mg as needed for adrenal crisis 3 each prn  . KOREAN PANAX GINSENG PO Take 1,000 mg by mouth daily.    Marland Kitchen OVER THE COUNTER MEDICATION Motherwort tea    .  Saline Bacteriostatic (SODIUM CHLORIDE BACTERIOSTATIC) 0.9 % injection Inject 1 mL as directed as needed. 10 mL 3  . Syringe/Needle, Disp, (SYRINGE 3CC/23GX1") 23G X 1" 3 ML MISC Use once every 2 weeks to inject testosterone 50 each 1   No current facility-administered medications on file prior to visit.   No Known Allergies   No family history of diabetes, hypertension, hyperlipidemia, heart disease, cancer.   PE: BP 120/82   Pulse 83   Ht 5' 0.5" (1.537 m)   Wt 220 lb 9.6 oz (100.1 kg)   SpO2 99%   BMI 42.37 kg/m  Body mass index is 42.37 kg/m.  Wt Readings from Last 3 Encounters:  07/29/20 220 lb 9.6 oz (100.1 kg)  03/31/20 224 lb (101.6 kg)  07/17/18 218 lb (98.9 kg)   Constitutional: overweight, in NAD Eyes: PERRLA, EOMI, no exophthalmos ENT: moist mucous  membranes, no thyromegaly, no cervical lymphadenopathy Cardiovascular: RRR, No MRG Respiratory: CTA B Gastrointestinal: abdomen soft, NT, ND, BS+ Musculoskeletal: no deformities, strength intact in all 4 Skin: moist, warm, no rashes, + excess hair on chin and sideburns Neurological: no tremor with outstretched hands, DTR normal in all 4  ASSESSMENT: 1. Nonclassical CAH -In this condition, the adrenal glands cannot produce enough cortisol and the hormone production is redirected towards androgen production via increased ACTH. Therefore, we tx this with glucocorticoids not only to treat the adrenal insufficiency, but also to decrease the ACTH and, therefore, adrenal androgens. OCPs also help in reducing the androgens.   2. Adrenal insufficiency - Associated with her CAH   3. Vitamin D deficiency  PLAN:  1. And 2.  Patient has a lifelong history of CAH, most likely nonrelaxing low, but with adrenal insufficiency.  She did not have to have vaginoplasty and she was successfully getting pregnant with 4 children.  She does not have sodium and potassium abnormalities and no hypotension.  She is usually not orthostatics and did not have salt cravings in the past but describes this at last visit.  She also had some dizziness, abdominal pain, and diarrhea last year.  No weight loss or weight gain.  She also described an absence seizure at last visit, which happened 2 weeks prior to the appointment.  At last visit, I advised her to discuss with PCP and possibly be referred to neurology and possibly also GI.  I did not feel that the symptoms were related to adrenal insufficiency especially since she was very well replaced with hydrocortisone.  We did discuss at last visit about using injectable hydrocortisone and I advised her when to use it.  I sent it to the pharmacy. However, this was 2000$ >> could not get it. -She was on fludrocortisone in the past, but this was stopped due to developing hypotension and  edema.  She was using hydrocortisone doses at higher doses than recommended in the past as she did not feel well at physiologic doses.  The only positive outcome of statin practice was that, at higher doses, hydrocortisone confered some mineralocorticoid activity -We continued hydrocortisone 20 mg in a.m. and 15 mg in p.m. >> now 20 mg 2x a day, but is aware that she needs to stay with the lowest dose possible and is working on decreasing the dose.  I do suspect that she has decreased absorption of hydrocortisone so she benefits from a slightly higher dose of hydrocortisone than normal. -At last 2 visits, ACTH was higher and also testosterone was higher.  We did try to  start eflornithine cream but this was not covered by her insurance.  She is not getting testosterone from transfer.  She is not on spironolactone.  At this visit, we did discuss about the higher testosterone but she is not bothered by hirsutism and is not interested in treatment for now.  We briefly addressed spironolactone, but will hold off adding it for now -A DHEA-S was checked at last visit and it was normal.  I also checked a 17 hydroxyprogesterone, to see if this is not suppressed, which can be seen in the setting of over replacement with steroids -She had normal bone density scores in the past.  Plan to repeat another bone density scan in the next 1 to 2 years. -I refilled her hydrocortisone prescription at last visit, 4 months ago. - I suggested to: Patient Instructions  Please try to reduce:  -Hydrocortisone 20 mg in a.m. and 15 mg in p.m.  - You absolutely need to take this medication every day and not skip doses. - Please double the dose if you have a fever, for the duration of the fever. - If you cannot take anything by mouth (vomiting) or you have severe diarrhea so that you eliminate the hydrocortisone pills in your stool, please make sure that you get the hydrocortisone in the vein instead - go to the nearest emergency  department/urgent care or you may go to your PCPs office   Please stop at the lab.  Please come back for a follow-up appointment in 1 year.  3. Vitamin D deficiency -At last visit, vitamin D was still slightly low, on 10,000 units of vitamin D daily. -I advised her to add vitamin K2 for better absorption, but did not change the vitamin D dose -At this visit, she mentions that she actually stopped the vitamin D supplement and is taking a bone and joint supplement but she is not sure how much vitamin D contains.  She was sent me a gradients. -We will recheck the level in the next few days (she will return - lab closed now)  Orders Placed This Encounter  Procedures  . VITAMIN D 25 Hydroxy (Vit-D Deficiency, Fractures)   Patient sent me the gradients Cabergoline supplements: Osteo Bi-Flex  Joint health triple strength *vitamin D  Supplement Facts: serving size 2 Tablets serving per container 40 calories 10 total carbohydrate 2g vitamin D (as D3 cholecalciferol) 55mcg(2,000IU)250% sodium 20 mg <1% glucosamine HCI 1,500mg (1.5g) joint shield Boswellia serrata extract (resin)  5-loxin advanced 100mg  chondroitin/MSM complex chondronitin sulfaate, methyisulfonyimethane  275mg    Other Ingredients: cellulose(plant origin), crospovidone. contain <2% of: cellulose coating. guar gum, hydroxypropyl methylcellulose, magnesium silicate, maitodextrin, medium-chain triglycerides, polydextrose silica, titanium dioxide color, vegetable magnesium stearate. contains shellfish (crab, crayfish, lobster, shrimp) ingredients. this product  contains of vitamin D, which is equivalent to 2,000 IU free of gluten*non-GMO  Vitamin D level is still pending.  , MD PhD [facility]

## 2020-07-30 ENCOUNTER — Encounter: Payer: Self-pay | Admitting: Internal Medicine

## 2020-08-01 NOTE — Addendum Note (Signed)
Addended by: [name] on: 08/01/2020 12:34 PM   Modules accepted: Orders

## 2021-01-19 ENCOUNTER — Other Ambulatory Visit: Payer: Self-pay | Admitting: Internal Medicine

## 2021-01-19 DIAGNOSIS — E250 Congenital adrenogenital disorders associated with enzyme deficiency: Secondary | ICD-10-CM

## 2021-03-16 ENCOUNTER — Telehealth: Payer: Self-pay | Admitting: Internal Medicine

## 2021-03-16 DIAGNOSIS — Z0000 Encounter for general adult medical examination without abnormal findings: Secondary | ICD-10-CM | POA: Diagnosis not present

## 2021-03-16 DIAGNOSIS — E559 Vitamin D deficiency, unspecified: Secondary | ICD-10-CM | POA: Diagnosis not present

## 2021-03-16 DIAGNOSIS — R5383 Other fatigue: Secondary | ICD-10-CM | POA: Diagnosis not present

## 2021-03-16 DIAGNOSIS — R1011 Right upper quadrant pain: Secondary | ICD-10-CM | POA: Diagnosis not present

## 2021-03-16 DIAGNOSIS — E250 Congenital adrenogenital disorders associated with enzyme deficiency: Secondary | ICD-10-CM | POA: Diagnosis not present

## 2021-03-16 DIAGNOSIS — Z1159 Encounter for screening for other viral diseases: Secondary | ICD-10-CM | POA: Diagnosis not present

## 2021-03-16 DIAGNOSIS — Z131 Encounter for screening for diabetes mellitus: Secondary | ICD-10-CM | POA: Diagnosis not present

## 2021-03-16 NOTE — Telephone Encounter (Signed)
T, Please advise her that it is ok to have the CT scan ordered by PCP but it is expected that her adrenals appear abnormal since she does have a history of congenital adrenal hyperplasia.

## 2021-03-16 NOTE — Telephone Encounter (Signed)
Called and explained it was ok to have ordered CT scan. Pt will follow up and have scan done and will follow up afterwards to schedule follow up appt.

## 2021-03-16 NOTE — Telephone Encounter (Signed)
Patient states that she has a mass on adrenal gland per [place] order by primary care. Patient states that primary care recommends that we order a  stat CT of Adrenal gland. Please advise

## 2021-03-16 NOTE — Telephone Encounter (Signed)
Patient Requesting CT for Adrenal Glands - is having severe pain and another MD is advising that she needs a stat CT she [place] scared and confused.  Dr Ernestene Mention with [facility] is the recommending physician and patient is uncomfortable with him saying this and would like a call to [phone].

## 2021-03-30 ENCOUNTER — Ambulatory Visit: Payer: Medicare HMO | Admitting: Internal Medicine

## 2021-04-05 ENCOUNTER — Other Ambulatory Visit: Payer: Self-pay | Admitting: Adult Medicine

## 2021-04-05 DIAGNOSIS — Z1231 Encounter for screening mammogram for malignant neoplasm of breast: Secondary | ICD-10-CM | POA: Diagnosis not present

## 2021-04-10 DIAGNOSIS — R1011 Right upper quadrant pain: Secondary | ICD-10-CM | POA: Diagnosis not present

## 2021-04-16 ENCOUNTER — Encounter: Payer: Self-pay | Admitting: Internal Medicine

## 2021-04-28 DIAGNOSIS — R928 Other abnormal and inconclusive findings on diagnostic imaging of breast: Secondary | ICD-10-CM | POA: Diagnosis not present

## 2021-04-28 DIAGNOSIS — R319 Hematuria, unspecified: Secondary | ICD-10-CM | POA: Diagnosis not present

## 2021-04-28 DIAGNOSIS — R7401 Elevation of levels of liver transaminase levels: Secondary | ICD-10-CM | POA: Diagnosis not present

## 2021-04-28 DIAGNOSIS — E250 Congenital adrenogenital disorders associated with enzyme deficiency: Secondary | ICD-10-CM | POA: Diagnosis not present

## 2021-05-01 ENCOUNTER — Telehealth: Payer: Self-pay | Admitting: Internal Medicine

## 2021-05-01 NOTE — Telephone Encounter (Signed)
Pt calling to request a refill of a cream once prescribed to prevent/stop hair loss. Please send medication to:  [id number] - [place], [place] - [address] AT [facility] OF [address]  Pt contact [phone]

## 2021-05-01 NOTE — Telephone Encounter (Signed)
Please advise 

## 2021-05-02 ENCOUNTER — Encounter: Payer: Self-pay | Admitting: Internal Medicine

## 2021-05-02 ENCOUNTER — Other Ambulatory Visit: Payer: Self-pay | Admitting: Internal Medicine

## 2021-05-02 MED ORDER — EFLORNITHINE HCL 13.9 % EX CREA: TOPICAL_CREAM | CUTANEOUS | 1 refills | Status: DC

## 2021-05-02 NOTE — Telephone Encounter (Signed)
I sent her a message to clarify with her which cream she is referring to.

## 2021-06-09 DIAGNOSIS — Z6841 Body Mass Index (BMI) 40.0 and over, adult: Secondary | ICD-10-CM | POA: Diagnosis not present

## 2021-06-09 DIAGNOSIS — Z1211 Encounter for screening for malignant neoplasm of colon: Secondary | ICD-10-CM | POA: Diagnosis not present

## 2021-06-09 DIAGNOSIS — R030 Elevated blood-pressure reading, without diagnosis of hypertension: Secondary | ICD-10-CM | POA: Diagnosis not present

## 2021-06-09 DIAGNOSIS — R1084 Generalized abdominal pain: Secondary | ICD-10-CM | POA: Diagnosis not present

## 2021-07-12 DIAGNOSIS — Z01419 Encounter for gynecological examination (general) (routine) without abnormal findings: Secondary | ICD-10-CM | POA: Diagnosis not present

## 2021-07-12 DIAGNOSIS — Z3202 Encounter for pregnancy test, result negative: Secondary | ICD-10-CM | POA: Diagnosis not present

## 2021-07-12 DIAGNOSIS — R102 Pelvic and perineal pain: Secondary | ICD-10-CM | POA: Diagnosis not present

## 2021-07-17 ENCOUNTER — Emergency Department (HOSPITAL_COMMUNITY)
Admission: EM | Admit: 2021-07-17 | Discharge: 2021-07-17 | Disposition: A | Discharge status: Home / Self Care | Payer: Medicare HMO | Attending: Emergency Medicine | Admitting: Emergency Medicine

## 2021-07-17 ENCOUNTER — Encounter (HOSPITAL_COMMUNITY): Payer: Self-pay | Admitting: Emergency Medicine

## 2021-07-17 DIAGNOSIS — B029 Zoster without complications: Secondary | ICD-10-CM | POA: Diagnosis not present

## 2021-07-17 DIAGNOSIS — Z743 Need for continuous supervision: Secondary | ICD-10-CM | POA: Diagnosis not present

## 2021-07-17 DIAGNOSIS — R109 Unspecified abdominal pain: Secondary | ICD-10-CM | POA: Diagnosis not present

## 2021-07-17 DIAGNOSIS — I10 Essential (primary) hypertension: Secondary | ICD-10-CM | POA: Diagnosis not present

## 2021-07-17 DIAGNOSIS — R21 Rash and other nonspecific skin eruption: Secondary | ICD-10-CM | POA: Diagnosis not present

## 2021-07-17 DIAGNOSIS — R000 Tachycardia, unspecified: Secondary | ICD-10-CM | POA: Diagnosis not present

## 2021-07-17 LAB — COMPREHENSIVE METABOLIC PANEL
ALT: 62 U/L — ABNORMAL HIGH (ref 0–44)
AST: 51 U/L — ABNORMAL HIGH (ref 15–41)
Albumin: 4.1 g/dL (ref 3.5–5.0)
Alkaline Phosphatase: 66 U/L (ref 38–126)
Anion gap: 11 (ref 5–15)
BUN: 8 mg/dL (ref 8–23)
CO2: 21 mmol/L — ABNORMAL LOW (ref 22–32)
Calcium: 9.3 mg/dL (ref 8.9–10.3)
Chloride: 102 mmol/L (ref 98–111)
Creatinine, Ser: 0.82 mg/dL (ref 0.44–1.00)
GFR, Estimated: >60 mL/min (ref >60)
Glucose, Bld: 130 mg/dL — ABNORMAL HIGH (ref 70–99)
Potassium: 4.1 mmol/L (ref 3.5–5.1)
Sodium: 134 mmol/L — ABNORMAL LOW (ref 135–145)
Total Bilirubin: 0.7 mg/dL (ref 0.3–1.2)
Total Protein: 8.5 g/dL — ABNORMAL HIGH (ref 6.5–8.1)

## 2021-07-17 LAB — URINALYSIS, MICROSCOPIC (REFLEX): Bacteria, UA: NONE SEEN

## 2021-07-17 LAB — CBC
HCT: 47.6 % — ABNORMAL HIGH (ref 36.0–46.0)
Hemoglobin: 15.4 g/dL — ABNORMAL HIGH (ref 12.0–15.0)
MCH: 27.9 pg (ref 26.0–34.0)
MCHC: 32.4 g/dL (ref 30.0–36.0)
MCV: 86.4 fL (ref 80.0–100.0)
Platelets: 277 10*3/uL (ref 150–400)
RBC: 5.51 MIL/uL — ABNORMAL HIGH (ref 3.87–5.11)
RDW: 12.5 % (ref 11.5–15.5)
WBC: 6 10*3/uL (ref 4.0–10.5)
nRBC: 0 % (ref 0.0–0.2)

## 2021-07-17 LAB — URINALYSIS, ROUTINE W REFLEX MICROSCOPIC
Bilirubin Urine: NEGATIVE
Glucose, UA: NEGATIVE mg/dL
Ketones, ur: NEGATIVE mg/dL
Leukocytes,Ua: NEGATIVE
Nitrite: NEGATIVE
Protein, ur: NEGATIVE mg/dL
Specific Gravity, Urine: <1.005 — ABNORMAL LOW (ref 1.005–1.030)
pH: 6 (ref 5.0–8.0)

## 2021-07-17 LAB — LIPASE, BLOOD: Lipase: 48 U/L (ref 11–51)

## 2021-07-17 MED ORDER — ONDANSETRON 4 MG PO TBDP
4.0000 mg | ORAL_TABLET | Freq: Once | ORAL | Status: AC
  Administered 2021-07-17: 4 mg via ORAL
  Filled 2021-07-17: qty 1

## 2021-07-17 MED ORDER — VALACYCLOVIR HCL 1 G PO TABS: 1000.0000 mg | ORAL_TABLET | Freq: Three times a day (TID) | ORAL | 0 refills | Status: DC

## 2021-07-17 MED ORDER — KETOROLAC TROMETHAMINE 30 MG/ML IJ SOLN
30.0000 mg | Freq: Once | INTRAMUSCULAR | Status: AC
Start: 2021-07-17 — End: 2021-07-17
  Administered 2021-07-17: 30 mg via INTRAMUSCULAR
  Filled 2021-07-17: qty 1

## 2021-07-17 MED ORDER — OXYCODONE-ACETAMINOPHEN 5-325 MG PO TABS
1.0000 | ORAL_TABLET | Freq: Once | ORAL | Status: AC
  Administered 2021-07-17: 1 via ORAL
  Filled 2021-07-17: qty 1

## 2021-07-17 MED ORDER — OXYCODONE-ACETAMINOPHEN 5-325 MG PO TABS: 1.0000 | ORAL_TABLET | Freq: Three times a day (TID) | ORAL | 0 refills | Status: DC | PRN

## 2021-07-17 NOTE — ED Provider Notes (Signed)
[facility] EMERGENCY DEPARTMENT Provider Note   CSN: [id number] Arrival date & time: 07/17/21  1326     History  Chief Complaint  Patient presents with   Abdominal Pain    Tracy Larson is a 66 y.o. female.   Abdominal Pain Patient presents with abdominal pain.  States she has had pain for a year.  Has been seen extensively by her PCP and other providers for the pain.  States she is sure the pain is from her kidney or her adrenal.  Has a history of congenital adrenal hyperplasia.  States over the last few days though pain is gotten worse.  Now worse in the right flank/abdomen.  States it feels that it is deeper.  No nausea or vomiting.  No dysuria but states she was drinking water because she thought she may have a UTI.  Rash developed 2 days ago.  Patient states she is sure the pain is coming from her kidney or adrenal.  States she has had a CT scan recently and it showed a mass on the uterus.  States she went to OB/GYN but states they told her she was not pregnant but were unable to get into what the mass was.    Home Medications Prior to Admission medications   Medication Sig Start Date End Date Taking? Authorizing Provider  Ascorbic Acid (VITAMIN C PO) Take 500 tablets by mouth in the morning and at bedtime.   Yes [provider]  hydrocortisone (CORTEF) 10 MG tablet TAKE 2 TABLETS BY MOUTH IN THE MORNING AND 1 IN THE EVENING Patient taking differently: Take 30 mg by mouth daily. 01/19/21  Yes Philemon Kingdom, MD  Misc Natural Products [facility] COMPLEX PO) Take 2 tablets by mouth daily. 500 mg per patient   Yes [provider]  oxyCODONE-acetaminophen (PERCOCET/ROXICET) 5-325 MG tablet Take 1-2 tablets by mouth every 8 (eight) hours as needed for severe pain. 07/17/21  Yes Davonna Belling, MD  valACYclovir (VALTREX) 1000 MG tablet Take 1 tablet (1,000 mg total) by mouth 3 (three) times daily. 07/17/21  Yes Davonna Belling, MD  Eflornithine  HCl 13.9 % cream Use on desired area twice a day Patient not taking: Reported on 07/17/2021 05/02/21   Philemon Kingdom, MD  hydrocortisone sodium succinate (SOLU-CORTEF) 100 MG SOLR injection Inject 100 mg as needed for adrenal crisis Patient not taking: Reported on 07/29/2020 03/31/20   Philemon Kingdom, MD  Saline Bacteriostatic (SODIUM CHLORIDE BACTERIOSTATIC) 0.9 % injection Inject 1 mL as directed as needed. Patient not taking: Reported on 07/17/2021 03/31/20   Philemon Kingdom, MD  Syringe/Needle, Disp, (SYRINGE 3CC/23GX1") 23G X 1" 3 ML MISC Use once every 2 weeks to inject testosterone Patient not taking: Reported on 07/17/2021 03/31/20   Philemon Kingdom, MD      Allergies    Patient has no known allergies.    Review of Systems   Review of Systems  Gastrointestinal:  Positive for abdominal pain.   Physical Exam Updated Vital Signs BP 107/76    Pulse 77    Temp 98.1 F (36.7 C) (Oral)    Resp 18    SpO2 100%  Physical Exam Vitals and nursing note reviewed.  Cardiovascular:     Rate and Rhythm: Normal rate.  Pulmonary:     Breath sounds: Normal breath sounds.  Abdominal:     Palpations: There is no hepatomegaly.     Tenderness: There is abdominal tenderness.     Comments: Erythematous papular vesicular rash along  right flank along the dermatome.  Tender at this area.  No hernia palpated  Skin:    General: Skin is warm.  Neurological:     Mental Status: She is alert and oriented to person, place, and time.    ED Results / Procedures / Treatments   Labs (all labs ordered are listed, but only abnormal results are displayed) Labs Reviewed  COMPREHENSIVE METABOLIC PANEL - Abnormal; Notable for the following components:      Result Value   Sodium 134 (*)    CO2 21 (*)    Glucose, Bld 130 (*)    Total Protein 8.5 (*)    AST 51 (*)    ALT 62 (*)    All other components within normal limits  CBC - Abnormal; Notable for the following components:   RBC 5.51 (*)     Hemoglobin 15.4 (*)    HCT 47.6 (*)    All other components within normal limits  URINALYSIS, ROUTINE W REFLEX MICROSCOPIC - Abnormal; Notable for the following components:   Specific Gravity, Urine <1.005 (*)    Hgb urine dipstick SMALL (*)    All other components within normal limits  LIPASE, BLOOD  URINALYSIS, MICROSCOPIC (REFLEX)    EKG None  Radiology No results found.  Procedures Procedures    Medications Ordered in ED Medications  oxyCODONE-acetaminophen (PERCOCET/ROXICET) 5-325 MG per tablet 1 tablet (1 tablet Oral Given 07/17/21 1454)  ondansetron (ZOFRAN-ODT) disintegrating tablet 4 mg (4 mg Oral Given 07/17/21 1454)  ketorolac (TORADOL) 30 MG/ML injection 30 mg (30 mg Intramuscular Given 07/17/21 2007)  oxyCODONE-acetaminophen (PERCOCET/ROXICET) 5-325 MG per tablet 1 tablet (1 tablet Oral Given 07/17/21 2116)    ED Course/ Medical Decision Making/ A&P                           Medical Decision Making Amount and/or Complexity of Data Reviewed External Data Reviewed: notes. Labs:  Decision-making details documented in ED Course. Radiology: independent interpretation performed. Decision-making details documented in ED Course. ECG/medicine tests: independent interpretation performed. Decision-making details documented in ED Course.   Patient presents abdominal pain.  States she is had the pain for a year and abdomen.  Has had work-up that was unrevealing.  Reportedly had negative CT scans.  States she did have a "mass on her uterus and a mass by her kidney.  Reviewing notes it appears that the mass on her kidney may be congenital adrenal hyperplasia.  States she thought she had a UTI so she was drinking cranberry juice.  Urine does not show infection here lab work reassuring.  Hemoglobin mildly elevated but appears to be at baseline.  Normal white count.  However does have a rash on her right flank consistent with shingles/zoster.  I think this is the cause of the increased  pain.  Pain is increased over the last few days.  We will treat with pain medicines.  Had been given Toradol and oxycodone here.  We will treat with antivirals.  Follow-up with her endocrinologist to see if she needs increase of her chronic steroids.  I think the zoster is the cause of her increased pain and not an intra-abdominal pathology.  Do not feel as if I need to repeat a CT scan although I do not have access to 1 from prior.  We will follow-up as an outpatient with PCP.        Final Clinical Impression(s) / ED Diagnoses Final diagnoses:  Herpes zoster without complication    Rx / DC Orders ED Discharge Orders          Ordered    valACYclovir (VALTREX) 1000 MG tablet  3 times daily        07/17/21 2103    oxyCODONE-acetaminophen (PERCOCET/ROXICET) 5-325 MG tablet  Every 8 hours PRN        07/17/21 2104              Davonna Belling, MD 07/17/21 2309

## 2021-07-17 NOTE — Discharge Instructions (Addendum)
Call Dr. Cruzita Lederer about adjusting your steroids.  The pain medicine should hopefully help.  Your primary care doctor can help control the pain also.  Your urine did not show infection today and work-up was otherwise reassuring.

## 2021-07-17 NOTE — ED Triage Notes (Signed)
Pt BIB [facility] from home c/o a rash on the right side of her abdomen and is painful. The rash has been present since Saturday.

## 2021-07-17 NOTE — ED Provider Triage Note (Signed)
Emergency Medicine Provider Triage Evaluation Note  Tracy Larson , a 66 y.o. female  was evaluated in triage.  Pt complains of sharp, stabbing right lower quadrant abdominal pain that started 4 days ago.  She has a rash to the right lower quadrant region that started 3 days ago.  Has associated nausea x1 week.  Has not tried any medications for symptoms.  Denies emesis, diarrhea, fever, chills.  Patient still has her appendix and gallbladder.  She denies a past medical history of chickenpox however she notes she has been vaccinated in the past.  Review of Systems  Positive: As per HPI above Negative: Fever, chills  Physical Exam  Pulse 81    Temp 98.7 F (37.1 C) (Oral)    Resp (!) 24    SpO2 97%  Gen:   Awake, in acute distress Resp:  Normal effort  MSK:   Moves extremities without difficulty  Other:  No CVA tenderness bilaterally.  Moderate tenderness to palpation to right lower quadrant with light touch.  Vesicular rash noted to right sided following dermatome.  Medical Decision Making  Medically screening exam initiated at 2:39 PM.  Appropriate orders placed.  Tracy Larson was informed that the remainder of the evaluation will be completed by another provider, this initial triage assessment does not replace that evaluation, and the importance of remaining in the ED until their evaluation is complete.    Monasia Lair A, PA-C 07/17/21 1451

## 2021-07-17 NOTE — ED Triage Notes (Signed)
Patient complains of sharp, stabbing RLQ abdominal pain that started on Friday. Patient reports nausea for the last week,denies emesis and diarrhea. Patient alert, oriented, and complaining of pain.

## 2021-07-19 DIAGNOSIS — E250 Congenital adrenogenital disorders associated with enzyme deficiency: Secondary | ICD-10-CM | POA: Diagnosis not present

## 2021-07-19 DIAGNOSIS — Z789 Other specified health status: Secondary | ICD-10-CM | POA: Diagnosis not present

## 2021-07-19 DIAGNOSIS — B029 Zoster without complications: Secondary | ICD-10-CM | POA: Diagnosis not present

## 2021-07-19 DIAGNOSIS — Z6839 Body mass index (BMI) 39.0-39.9, adult: Secondary | ICD-10-CM | POA: Diagnosis not present

## 2021-07-19 DIAGNOSIS — R928 Other abnormal and inconclusive findings on diagnostic imaging of breast: Secondary | ICD-10-CM | POA: Diagnosis not present

## 2021-07-21 ENCOUNTER — Encounter: Payer: Self-pay | Admitting: Internal Medicine

## 2021-07-26 ENCOUNTER — Telehealth: Payer: Self-pay | Admitting: Internal Medicine

## 2021-07-26 DIAGNOSIS — R748 Abnormal levels of other serum enzymes: Secondary | ICD-10-CM | POA: Diagnosis not present

## 2021-07-26 DIAGNOSIS — Z6839 Body mass index (BMI) 39.0-39.9, adult: Secondary | ICD-10-CM | POA: Diagnosis not present

## 2021-07-26 DIAGNOSIS — E250 Congenital adrenogenital disorders associated with enzyme deficiency: Secondary | ICD-10-CM | POA: Diagnosis not present

## 2021-07-26 DIAGNOSIS — B029 Zoster without complications: Secondary | ICD-10-CM | POA: Diagnosis not present

## 2021-07-26 DIAGNOSIS — R928 Other abnormal and inconclusive findings on diagnostic imaging of breast: Secondary | ICD-10-CM | POA: Diagnosis not present

## 2021-07-26 DIAGNOSIS — R030 Elevated blood-pressure reading, without diagnosis of hypertension: Secondary | ICD-10-CM | POA: Diagnosis not present

## 2021-07-26 NOTE — Telephone Encounter (Signed)
Called and advised pt no extra equipment is required for virtual visit. If pt can get get her blood pressure and O2 it's helpful but not required. Pt verbalized understanding.

## 2021-07-26 NOTE — Telephone Encounter (Signed)
Patient scheduled a virtual visit for Thursday 08/03/2021 because she has shingles. She was wondering if we have the technology for her to "connect her home vital equipment to upload" her vitals.  I advised that I did not know but that we would check into that and let her know either before or on the day of her appointment.  Patients call back number is [phone]

## 2021-08-03 ENCOUNTER — Other Ambulatory Visit: Payer: Self-pay

## 2021-08-03 ENCOUNTER — Encounter: Payer: Self-pay | Admitting: Internal Medicine

## 2021-08-03 ENCOUNTER — Telehealth (INDEPENDENT_AMBULATORY_CARE_PROVIDER_SITE_OTHER): Payer: Medicare HMO | Admitting: Internal Medicine

## 2021-08-03 DIAGNOSIS — E250 Congenital adrenogenital disorders associated with enzyme deficiency: Secondary | ICD-10-CM | POA: Diagnosis not present

## 2021-08-03 DIAGNOSIS — E559 Vitamin D deficiency, unspecified: Secondary | ICD-10-CM | POA: Diagnosis not present

## 2021-08-03 DIAGNOSIS — E2740 Unspecified adrenocortical insufficiency: Secondary | ICD-10-CM

## 2021-08-03 NOTE — Patient Instructions (Addendum)
Please try to reduce the steroid dose slowly (by 10 mg every 1-2 weeks) to: -Hydrocortisone 20 mg in a.m. and 15 mg in p.m.  - You absolutely need to take this medication every day and not skip doses. - Please double the dose if you have a fever, for the duration of the fever. - If you cannot take anything by mouth (vomiting) or you have severe diarrhea so that you eliminate the hydrocortisone pills in your stool, please make sure that you get the hydrocortisone in the vein instead - go to the nearest emergency department/urgent care or you may go to your PCPs office   Please come back for a follow-up appointment in  6 months.

## 2021-08-03 NOTE — Progress Notes (Signed)
Patient ID: Tracy Larson, female   DOB: December 06, 1955, 66 y.o.   MRN: [id number]  Patient location: Home My location: Office Persons participating in the virtual visit: patient, provider  I connected with the patient on 08/03/21 at 10:27 AM EST by a video enabled telemedicine application and verified that I am speaking with the correct person.   I discussed the limitations of evaluation and management by telemedicine and the availability of in person appointments. The patient expressed understanding and agreed to proceed.   Details of the encounter are shown below.  HPI  Tracy Larson is a 66 y.o.-year-old female, initially referred by her PCP, Dr. Marin Comment, returning for follow-up for congenital adrenal hyperplasia (most likely non-classical form), adrenal insufficiency, and vitamin D deficiency. She has been followed by Dr. Buddy Duty, but had to switch dr's b/c outstanding bill with his office. Last visit with me 1 year ago. PCP: Dr Raliegh Ip (?)  [facility]).  Interim history: In 2021, she described abdominal pain ("contractions", like gas, similar to when she was 66 y/o), also diarrhea, headaches, dizziness.  She also described an absence seizures 2 weeks prior to our last appointment, witnessed by daughter (I was "dazed" - woke up when sprinkled with water).  All of these have resolved before last visit and did not recur afterwards. However, she developed shingles since last visit.  She was initially recommended prednisone, which she refused.  In that case, she was advised to increase the dose of hydrocortisone, but she contacted me for further instructions.  I advised her through MyChart that I cannot recommend the dose of hydrocortisone needed to treated shingles, but I did advise her that after the treatment is completed, we again discussed about how to decrease the dose back to the adrenal insufficiency replacement doses.  She is currently taking hydrocortisone - currently 80 mg a day, prev.  100 mg daily. Also on Claritin and Folic acid, which she feels both help with the shingles pain. She has back pain.  Reviewed and addended history: Pt. has been dx with adrenal insufficiency at birth and then 66 y/o - became pregnant (she had 4 children -no history of infertility).   She did not have to have vaginoplasty.  She tried fludrocortisone (but developed edema and hypertension and had to stop).  She has h/o adrenal crises:  1985  2005 - 2/2 flu [facility]).  She did not have genetic counseling at dx.  She is on replacement with hydrocortisone previously on high dose, up to 100 mg daily for 15 years when her children were growing up, then decreased to 50 mg daily for another 10 years, 40 mg daily for 16 years.  We decreased the dose and she tried several lower doses, currently on 20 mg in a.m. (at 6 am, then goes back to bed), then wakes up definitively at 12 pm >> changed to 20 mg in am and 10 >> 15 mg in the pm - changed it in 06/2019.  She is aware she needs to increase the dose of glucocorticoid if she has fever and needs to get injectable steroids if she cannot take p.o.   No diabetes, hypertension, fractures.  She does have hirsutism on chin but no acne.  We tried to start eflornithine cream but this was not covered.  Previous bone density scores were normal: 07/22/17 ([place]) Lumbar spine L1-L4 Femoral neck (FN) 33% distal radius  T-score 1.6 RFN: 0.4 LFN: 0.3 n/a   [facility], 2013 Lumbar spine (L1-L3) Femoral  neck (FN) 33% distal radius  T-score + 0.5 RFN:- 0.1 LFN:+ 0.5 n/a   Reviewed pertinent labs: Her labs initially normalized (except for the 17 hydroxy progesterone) after she decreased the dose of hydrocortisone.  However, her testosterone started to increase afterwards.  At last visit, her ACTH was also high.  I checked with her and she was not getting testosterone transfer.  Component     Latest Ref Rng & Units 03/31/2020  Testosterone, Serum  (Total)     ng/dL 83 (H)  % Free Testosterone     % 1.0  Free Testosterone, S     pg/mL 8.3 (H)  Sex Hormone Binding Globulin     nmol/L 47.6  DHEA-Sulfate, LCMS     ug/dL 36  06-[id number], LC/MS/MS      ng/dL 0,737  T062 ACTH     6 - 50 pg/mL 297 (H)   Component     Latest Ref Rng & Units 08/03/2019  Testosterone, Serum (Total)     ng/dL 83 (H)  % Free Testosterone     % 1.4  Free Testosterone, S     pg/mL 12 (H)  Sex Hormone Binding Globulin     nmol/L 42.2  C206 ACTH     6 - 50 pg/mL 261 (H)  17-OH-Progesterone, LC/MS/MS     * ng/dL 6,948  DHEA-Sulfate, LCMS     ug/dL 17   Component     Latest Ref Rng & Units 07/17/2018  Testosterone, Serum (Total)     ng/dL 61 (H)  % Free Testosterone     % 1.2  Free Testosterone, S     pg/mL 7.3 (H)  Sex Hormone Binding Globulin     nmol/L 53.7  17-OH-Progesterone, LC/MS/MS     * ng/dL 5,462  V035 ACTH     6 - 50 pg/mL 32  DHEA-Sulfate, LCMS     ug/dL 11   Component     Latest Ref Rng & Units 07/17/2017  Testosterone, Serum (Total)     ng/dL 49  % Free Testosterone     % 1.1  Free Testosterone, S     pg/mL 5.4  Sex Hormone Binding Globulin     nmol/L 54.8  DHEA-Sulfate, LCMS     ug/dL 14  00-XF-[id number], LC/MS/MS     ng/dL 789  F810 ACTH     6 - 50 pg/mL 9   Component     Latest Ref Rng & Units 02/07/2016  Testosterone     3 - 41 ng/dL 37  Sex Horm Binding Glob, Serum     17.3 - 125.0 nmol/L 46.7  Testosterone Free     0.0 - 4.2 pg/mL 1.8  ALDOSTERONE     0.0 - 30.0 ng/dL 1.0  Renin     1.751 - 5.380 ng/mL/hr 1.048  ALDOS/RENIN RATIO     0.0 - 30.0 1.0  Potassium     3.5 - 5.1 mEq/L 3.9  DHEA-SO4     29.4 - 220.5 ug/dL 02.5 (L)  [id number]     ng/dL 086  ACTH     7.2 - 76.1 pg/mL 9.2   Component     Latest Ref Rng & Units 08/06/2014 02/07/2015  Testosterone     10 - 70 ng/dL 66   Sex Horm Binding Glob, Serum     14 - 73 nmol/L 25   Testosterone Free     0.6 - 6.8 pg/mL  13.9 (H)   Testosterone-% Free  0.4 - 2.4 % 2.1   PRA LC/MS/MS     0.25 - 5.82 ng/mL/h 1.67 5.73  ALDO / PRA Ratio     0.9 - 28.9 Ratio 1.8 1.6  ALDOSTERONE     ng/dL 3 9  DHEA     102 - 1,185 ng/dL 27 (L)   Androstenedione     ng/dL 10   17-OH-Progesterone, LC/MS/MS     ng/dL 2,014 (H) 1,574 (H)  C206 ACTH     6 - 50 pg/mL 78 (H) 57 (H)  Potassium     3.5 - 5.1 mEq/L  3.9  DHEA-SO4     8 - 188 ug/dL  26   09/24/2012: - 17 hydroxyprogesterone 6180 - Hemoglobin A1c 5.4% - Lipids: 176/94/40/126 - TSH 1.94 - LFTs normal - ACTH 201.7 - DHEAS 42.5 - Aldosterone <1, PRA 1.37  Previous records from Dr. Buddy Duty: - Patient was previously on fludrocortisone, however, this rendered her hypertensive and with edema >>  fludrocortisone was eventually stopped. Dr. Buddy Duty mentioned that she is on a high dose of steroids, and probably her many years of over replacement with steroids have caused her secondary adrenal insufficiency.   Vitamin D deficiency  -Previously on ergocalciferol, then 1 cholecalciferol 12,000 units daily (but she felt this was causing lethargy...) >> at last visit she was on 8000 IU >> we increased to 10,000 units daily.  At last visit we continued the same dose but I advised her to add K2 vitamin D has the vitamin D absorption.  At today's visit, however, she tells me she stopped the vitamin D supplement 3 mo 05/2020 and now takes a bone and joint supplement (will send me the ingredients).  At last visit, vitamin D level was still low, but improving Lab Results  Component Value Date   VD25OH 27.34 (L) 03/31/2020   VD25OH 23.86 (L) 08/03/2019   VD25OH 34.12 07/17/2018   VD25OH 19.27 (L) 07/17/2017   VD25OH 16.22 (L) 02/07/2016   VD25OH 11.32 (L) 02/07/2015   Before the coronavirus pandemic she was going to the gym consistently, doing Silver sneakers.   She has a history of iritis. She was on 50 mg Hydrocortisone for the month of 06/2018 b/c of this, then  decreased to 30 mg daily.  ROS: + see HPI  I reviewed pt's medications, allergies, PMH, social hx, family hx, and changes were documented in the history of present illness. Otherwise, unchanged from my initial visit note.   Past Medical History:  Diagnosis Date   Adrenal hyperplasia ([place])    Past surgical history: - C sections  History   Social History   Marital Status: Married    Spouse Name: [name]    Number of Children: 4   Occupational History    Chartered certified accountant    Social History Main Topics   Smoking status: Never Smoker    Smokeless tobacco: Not on file   Alcohol Use: No   Drug Use: No   Current Outpatient Medications on File Prior to Visit  Medication Sig Dispense Refill   Ascorbic Acid (VITAMIN C PO) Take 500 tablets by mouth in the morning and at bedtime.     Eflornithine HCl 13.9 % cream Use on desired area twice a day (Patient not taking: Reported on 07/17/2021) 45 g 1   hydrocortisone (CORTEF) 10 MG tablet TAKE 2 TABLETS BY MOUTH IN THE MORNING AND 1 IN THE EVENING (Patient taking differently: Take 30 mg by mouth daily.) 360 tablet 1  hydrocortisone sodium succinate (SOLU-CORTEF) 100 MG SOLR injection Inject 100 mg as needed for adrenal crisis (Patient not taking: Reported on 07/29/2020) 3 each prn   Misc Natural Products (GINSENG COMPLEX PO) Take 2 tablets by mouth daily. 500 mg per patient     oxyCODONE-acetaminophen (PERCOCET/ROXICET) 5-325 MG tablet Take 1-2 tablets by mouth every 8 (eight) hours as needed for severe pain. 10 tablet 0   Saline Bacteriostatic (SODIUM CHLORIDE BACTERIOSTATIC) 0.9 % injection Inject 1 mL as directed as needed. (Patient not taking: Reported on 07/17/2021) 10 mL 3   Syringe/Needle, Disp, (SYRINGE 3CC/23GX1") 23G X 1" 3 ML MISC Use once every 2 weeks to inject testosterone (Patient not taking: Reported on 07/17/2021) 50 each 1   valACYclovir (VALTREX) 1000 MG tablet Take 1 tablet (1,000 mg total) by mouth 3 (three) times daily. 21 tablet 0    No current facility-administered medications on file prior to visit.   No Known Allergies   No family history of diabetes, hypertension, hyperlipidemia, heart disease, cancer.   PE: There were no vitals taken for this visit. There is no height or weight on file to calculate BMI.  Wt Readings from Last 3 Encounters:  07/29/20 220 lb 9.6 oz (100.1 kg)  03/31/20 224 lb (101.6 kg)  07/17/18 218 lb (98.9 kg)   Constitutional:  in NAD  The physical exam was not performed (virtual visit).  ASSESSMENT: 1. Nonclassical CAH -In this condition, the adrenal glands cannot produce enough cortisol and the hormone production is redirected towards androgen production via increased ACTH. Therefore, we tx this with glucocorticoids not only to treat the adrenal insufficiency, but also to decrease the ACTH and, therefore, adrenal androgens. OCPs also help in reducing the androgens.   2. Adrenal insufficiency - Associated with her CAH   3. Vitamin D deficiency  PLAN:  1. And 2.   -Patient has a lifelong history of CAH, most likely nonclassical, but with adrenal insufficiency.  She did not have to have vaginoplasty and she was successful getting pregnant with 4 children.  She does not have sodium and potassium abnormalities and no hypotension.  At last visit, she did describe salt craving and some dizziness but this was associated with abdominal pain and diarrhea.  She had no weight loss or weight gain.  She described an absence seizure at the previous visit, but this did not appear to be related to adrenal insufficiency, states she was taking plenty of hydrocortisone.  She was on fludrocortisone in the past, but this was stopped due to developing hypertension and edema.   -She is usually using hydrocortisone doses at higher doses as recommended in the past and she does not feel well at physiologic doses. -At last visit she was taking 20 mg of hydrocortisone twice a day.  We did discuss at last visit  than at previous visit about the need to stay with the lowest doses possible.  She may have decreased absorption of hydrocortisone or a degree of resistance to it.  She may benefit from a hydrocortisone pump when it becomes available. -Previously, ACTH and also testosterone was higher.  We tried to start eflornithine cream but this was not covered by her insurance.  I checked with her and she was not getting testosterone from transfer.  She is not on spironolactone.  We discussed about the higher testosterone level, but she was not bothered by hirsutism and she was not interested in treatment.  We held off adding spironolactone.  A DHEA-S level was  normal.  We also checked a 17 hydroxyprogesterone at our last in person visit and this was not suppressed, to indicate over replacement with hydrocortisone.  -She had normal bone density scores in the past.  Wound will need to repeat a DXA scan. -She contacted me since last visit as she developed shingles and the treating physician advised her to discuss with me about the dose of hydrocortisone that she needed to take for this.  I advised her that I cannot treat her shingles, and advised to discuss with the treating physician to see exactly how much to take and then we can reduce the dose to replacement doses after her shingles episode was resolved. -At this visit, I advised her that she can definitely take prednisone if so recommended from now on and then transition to hydrocortisone when she is over the acute episode. -For now, she is on quite high doses of hydrocortisone and we discussed about titrating this down by approximately 10 mg every 1 to 2 weeks - I suggested to: Patient Instructions  Please try to reduce the steroid dose slowly (by 10 mg every 1-2 weeks) to: -Hydrocortisone 20 mg in a.m. and 15 mg in p.m.  - You absolutely need to take this medication every day and not skip doses. - Please double the dose if you have a fever, for the duration of  the fever. - If you cannot take anything by mouth (vomiting) or you have severe diarrhea so that you eliminate the hydrocortisone pills in your stool, please make sure that you get the hydrocortisone in the vein instead - go to the nearest emergency department/urgent care or you may go to your PCPs office   Please come back for a follow-up appointment in 6 months.  3. Vitamin D deficiency -At last visit she was taking Osteo Bi-Flex with 2000 units vitamin D -We were not able to check a vitamin D level at that time as the appointment was virtual. -Reviewed latest vitamin D level available for review: Lab Results  Component Value Date   VD25OH 27.34 (L) 03/31/2020  -We will recheck her vitamin D level when she returns to the clinic  Philemon Kingdom, MD PhD [facility] Endocrinology

## 2021-08-07 DIAGNOSIS — Z0130 Encounter for examination of blood pressure without abnormal findings: Secondary | ICD-10-CM | POA: Diagnosis not present

## 2021-08-07 DIAGNOSIS — R928 Other abnormal and inconclusive findings on diagnostic imaging of breast: Secondary | ICD-10-CM | POA: Diagnosis not present

## 2021-08-07 DIAGNOSIS — B0229 Other postherpetic nervous system involvement: Secondary | ICD-10-CM | POA: Diagnosis not present

## 2021-08-07 DIAGNOSIS — Z6839 Body mass index (BMI) 39.0-39.9, adult: Secondary | ICD-10-CM | POA: Diagnosis not present

## 2021-08-10 DIAGNOSIS — B0229 Other postherpetic nervous system involvement: Secondary | ICD-10-CM | POA: Diagnosis not present

## 2021-08-10 DIAGNOSIS — Z6841 Body Mass Index (BMI) 40.0 and over, adult: Secondary | ICD-10-CM | POA: Diagnosis not present

## 2021-08-10 DIAGNOSIS — Z7689 Persons encountering health services in other specified circumstances: Secondary | ICD-10-CM | POA: Diagnosis not present

## 2021-08-10 DIAGNOSIS — Z79899 Other long term (current) drug therapy: Secondary | ICD-10-CM | POA: Diagnosis not present

## 2021-08-10 DIAGNOSIS — R928 Other abnormal and inconclusive findings on diagnostic imaging of breast: Secondary | ICD-10-CM | POA: Diagnosis not present

## 2021-08-10 DIAGNOSIS — R109 Unspecified abdominal pain: Secondary | ICD-10-CM | POA: Diagnosis not present

## 2021-08-15 DIAGNOSIS — R109 Unspecified abdominal pain: Secondary | ICD-10-CM | POA: Diagnosis not present

## 2021-08-15 DIAGNOSIS — Z780 Asymptomatic menopausal state: Secondary | ICD-10-CM | POA: Diagnosis not present

## 2021-08-23 ENCOUNTER — Other Ambulatory Visit: Payer: Self-pay | Admitting: Adult Medicine

## 2021-08-24 ENCOUNTER — Other Ambulatory Visit: Payer: Self-pay | Admitting: Internal Medicine

## 2021-08-24 DIAGNOSIS — Z6838 Body mass index (BMI) 38.0-38.9, adult: Secondary | ICD-10-CM | POA: Diagnosis not present

## 2021-08-24 DIAGNOSIS — B0229 Other postherpetic nervous system involvement: Secondary | ICD-10-CM | POA: Diagnosis not present

## 2021-08-24 DIAGNOSIS — R109 Unspecified abdominal pain: Secondary | ICD-10-CM | POA: Diagnosis not present

## 2021-08-24 DIAGNOSIS — R928 Other abnormal and inconclusive findings on diagnostic imaging of breast: Secondary | ICD-10-CM | POA: Diagnosis not present

## 2021-08-24 DIAGNOSIS — E250 Congenital adrenogenital disorders associated with enzyme deficiency: Secondary | ICD-10-CM

## 2021-08-29 ENCOUNTER — Other Ambulatory Visit: Payer: Self-pay | Admitting: Adult Medicine

## 2021-08-29 DIAGNOSIS — R928 Other abnormal and inconclusive findings on diagnostic imaging of breast: Secondary | ICD-10-CM

## 2021-09-02 DIAGNOSIS — R1031 Right lower quadrant pain: Secondary | ICD-10-CM | POA: Diagnosis not present

## 2021-09-02 DIAGNOSIS — Z1211 Encounter for screening for malignant neoplasm of colon: Secondary | ICD-10-CM | POA: Diagnosis not present

## 2021-09-05 DIAGNOSIS — Z1211 Encounter for screening for malignant neoplasm of colon: Secondary | ICD-10-CM | POA: Diagnosis not present

## 2021-09-05 LAB — HM COLONOSCOPY

## 2021-09-06 ENCOUNTER — Encounter: Payer: Self-pay | Admitting: Internal Medicine

## 2021-09-20 ENCOUNTER — Ambulatory Visit
Admission: RE | Admit: 2021-09-20 | Discharge: 2021-09-20 | Disposition: A | Discharge status: Home / Self Care | Payer: Medicare HMO | Source: Ambulatory Visit | Attending: Adult Medicine | Admitting: Adult Medicine

## 2021-09-20 ENCOUNTER — Ambulatory Visit: Payer: TRICARE For Life (TFL)

## 2021-09-20 DIAGNOSIS — R928 Other abnormal and inconclusive findings on diagnostic imaging of breast: Secondary | ICD-10-CM

## 2021-09-20 DIAGNOSIS — N6489 Other specified disorders of breast: Secondary | ICD-10-CM | POA: Diagnosis not present

## 2021-09-20 DIAGNOSIS — R922 Inconclusive mammogram: Secondary | ICD-10-CM | POA: Diagnosis not present

## 2021-09-20 LAB — HM MAMMOGRAPHY

## 2021-09-25 ENCOUNTER — Encounter: Payer: Self-pay | Admitting: Internal Medicine

## 2021-09-25 ENCOUNTER — Other Ambulatory Visit: Payer: Self-pay | Admitting: Internal Medicine

## 2021-09-25 DIAGNOSIS — R1011 Right upper quadrant pain: Secondary | ICD-10-CM | POA: Diagnosis not present

## 2021-09-25 DIAGNOSIS — R109 Unspecified abdominal pain: Secondary | ICD-10-CM | POA: Diagnosis not present

## 2021-09-25 DIAGNOSIS — Z1211 Encounter for screening for malignant neoplasm of colon: Secondary | ICD-10-CM | POA: Diagnosis not present

## 2021-09-25 DIAGNOSIS — E250 Congenital adrenogenital disorders associated with enzyme deficiency: Secondary | ICD-10-CM

## 2021-09-25 MED ORDER — HYDROCORTISONE 10 MG PO TABS: ORAL_TABLET | ORAL | 1 refills | Status: DC

## 2021-10-07 DIAGNOSIS — K2270 Barrett's esophagus without dysplasia: Secondary | ICD-10-CM | POA: Diagnosis not present

## 2021-10-11 DIAGNOSIS — R928 Other abnormal and inconclusive findings on diagnostic imaging of breast: Secondary | ICD-10-CM | POA: Diagnosis not present

## 2021-10-11 DIAGNOSIS — L98429 Non-pressure chronic ulcer of back with unspecified severity: Secondary | ICD-10-CM | POA: Diagnosis not present

## 2021-10-11 DIAGNOSIS — Z789 Other specified health status: Secondary | ICD-10-CM | POA: Diagnosis not present

## 2021-10-11 DIAGNOSIS — B0229 Other postherpetic nervous system involvement: Secondary | ICD-10-CM | POA: Diagnosis not present

## 2021-10-19 ENCOUNTER — Emergency Department (HOSPITAL_COMMUNITY)
Admission: EM | Admit: 2021-10-19 | Discharge: 2021-10-19 | Disposition: A | Discharge status: Home / Self Care | Payer: Medicare HMO | Attending: Emergency Medicine | Admitting: Emergency Medicine

## 2021-10-19 ENCOUNTER — Emergency Department (HOSPITAL_COMMUNITY): Payer: Medicare HMO

## 2021-10-19 ENCOUNTER — Encounter (HOSPITAL_COMMUNITY): Payer: Self-pay | Admitting: Emergency Medicine

## 2021-10-19 DIAGNOSIS — R109 Unspecified abdominal pain: Secondary | ICD-10-CM | POA: Insufficient documentation

## 2021-10-19 DIAGNOSIS — R9431 Abnormal electrocardiogram [ECG] [EKG]: Secondary | ICD-10-CM | POA: Diagnosis not present

## 2021-10-19 DIAGNOSIS — R0789 Other chest pain: Secondary | ICD-10-CM | POA: Diagnosis not present

## 2021-10-19 DIAGNOSIS — B0229 Other postherpetic nervous system involvement: Secondary | ICD-10-CM | POA: Diagnosis not present

## 2021-10-19 DIAGNOSIS — B009 Herpesviral infection, unspecified: Secondary | ICD-10-CM | POA: Insufficient documentation

## 2021-10-19 DIAGNOSIS — R079 Chest pain, unspecified: Secondary | ICD-10-CM | POA: Diagnosis not present

## 2021-10-19 LAB — CBC WITH DIFFERENTIAL/PLATELET
Abs Immature Granulocytes: 0.01 10*3/uL (ref 0.00–0.07)
Basophils Absolute: 0 10*3/uL (ref 0.0–0.1)
Basophils Relative: 0 %
Eosinophils Absolute: 0.1 10*3/uL (ref 0.0–0.5)
Eosinophils Relative: 2 %
HCT: 43.8 % (ref 36.0–46.0)
Hemoglobin: 14 g/dL (ref 12.0–15.0)
Immature Granulocytes: 0 %
Lymphocytes Relative: 37 %
Lymphs Abs: 2.3 10*3/uL (ref 0.7–4.0)
MCH: 28.5 pg (ref 26.0–34.0)
MCHC: 32 g/dL (ref 30.0–36.0)
MCV: 89 fL (ref 80.0–100.0)
Monocytes Absolute: 0.6 10*3/uL (ref 0.1–1.0)
Monocytes Relative: 10 %
Neutro Abs: 3.1 10*3/uL (ref 1.7–7.7)
Neutrophils Relative %: 51 %
Platelets: 273 10*3/uL (ref 150–400)
RBC: 4.92 MIL/uL (ref 3.87–5.11)
RDW: 13.2 % (ref 11.5–15.5)
WBC: 6.2 10*3/uL (ref 4.0–10.5)
nRBC: 0 % (ref 0.0–0.2)

## 2021-10-19 LAB — URINALYSIS, ROUTINE W REFLEX MICROSCOPIC
Bilirubin Urine: NEGATIVE
Glucose, UA: NEGATIVE mg/dL
Ketones, ur: NEGATIVE mg/dL
Nitrite: NEGATIVE
Protein, ur: NEGATIVE mg/dL
Specific Gravity, Urine: 1.012 (ref 1.005–1.030)
pH: 7 (ref 5.0–8.0)

## 2021-10-19 LAB — COMPREHENSIVE METABOLIC PANEL
ALT: 27 U/L (ref 0–44)
AST: 23 U/L (ref 15–41)
Albumin: 3.8 g/dL (ref 3.5–5.0)
Alkaline Phosphatase: 54 U/L (ref 38–126)
Anion gap: 8 (ref 5–15)
BUN: 9 mg/dL (ref 8–23)
CO2: 27 mmol/L (ref 22–32)
Calcium: 9.3 mg/dL (ref 8.9–10.3)
Chloride: 106 mmol/L (ref 98–111)
Creatinine, Ser: 0.79 mg/dL (ref 0.44–1.00)
GFR, Estimated: >60 mL/min (ref >60)
Glucose, Bld: 101 mg/dL — ABNORMAL HIGH (ref 70–99)
Potassium: 4.2 mmol/L (ref 3.5–5.1)
Sodium: 141 mmol/L (ref 135–145)
Total Bilirubin: 0.6 mg/dL (ref 0.3–1.2)
Total Protein: 7.3 g/dL (ref 6.5–8.1)

## 2021-10-19 LAB — LIPASE, BLOOD: Lipase: 55 U/L — ABNORMAL HIGH (ref 11–51)

## 2021-10-19 MED ORDER — OXYCODONE-ACETAMINOPHEN 5-325 MG PO TABS: 1.0000 | ORAL_TABLET | Freq: Four times a day (QID) | ORAL | 0 refills | Status: DC | PRN

## 2021-10-19 MED ORDER — OXYCODONE-ACETAMINOPHEN 5-325 MG PO TABS
1.0000 | ORAL_TABLET | Freq: Once | ORAL | Status: DC
  Filled 2021-10-19: qty 1

## 2021-10-19 NOTE — ED Triage Notes (Signed)
Patient here with complaint of continued right abdominal pain radiating to right chest that started in January and slowly increased in intensity over time to today. Diagnosed with shingles in January, patient states lesions have resolved but pain continues. ?

## 2021-10-19 NOTE — ED Provider Notes (Signed)
?[facility] EMERGENCY DEPARTMENT ?Provider Note ? ? ?CSN: [id number] ?Arrival date & time: 10/19/21  1601 ? ?  ? ?History ? ?Chief Complaint  ?Patient presents with  ? Abdominal Pain  ? ? ?Tracy Larson is a 66 y.o. female. ? ?HPI ?Patient presents for evaluation of right flank pain with chest tightness.  She has ongoing problems secondary to shingles infection, which started several months ago.  She has persistent pain in severity.  She was prescribed gabapentin but could not tolerate it.  She says her doctor has no answers for her.  She denies trauma.  She denies shortness of breath, weakness or dizziness.  She thinks that she has leakage from her spine. ? ?Home Medications ?Prior to Admission medications   ?Medication Sig Start Date End Date Taking? Authorizing Provider  ?oxyCODONE-acetaminophen (PERCOCET/ROXICET) 5-325 MG tablet Take 1 tablet by mouth every 6 (six) hours as needed for severe pain or moderate pain. 10/19/21  Yes Mancel Bale, MD  ?Ascorbic Acid (VITAMIN C PO) Take 500 tablets by mouth in the morning and at bedtime.    [provider]  ?Eflornithine HCl 13.9 % cream Use on desired area twice a day ?Patient not taking: Reported on 07/17/2021 05/02/21   [name], MD  ?hydrocortisone (CORTEF) 10 MG tablet TAKE 3 TABLETS BY MOUTH DAILY IN AM AND 2 TABLET IN  PM 09/25/21   [name], MD  ?hydrocortisone sodium succinate (SOLU-CORTEF) 100 MG SOLR injection Inject 100 mg as needed for adrenal crisis ?Patient not taking: Reported on 07/29/2020 03/31/20   [name], MD  ?Misc Natural Products [facility] COMPLEX PO) Take 2 tablets by mouth daily. 500 mg per patient    [provider]  ?Saline Bacteriostatic (SODIUM CHLORIDE BACTERIOSTATIC) 0.9 % injection Inject 1 mL as directed as needed. ?Patient not taking: Reported on 07/17/2021 03/31/20   [name], MD  ?Syringe/Needle, Disp, (SYRINGE 3CC/23GX1") 23G X 1" 3 ML MISC Use once every 2 weeks  to inject testosterone ?Patient not taking: Reported on 07/17/2021 03/31/20   [name], MD  ?valACYclovir (VALTREX) 1000 MG tablet Take 1 tablet (1,000 mg total) by mouth 3 (three) times daily. 07/17/21   [name], MD  ?   ? ?Allergies    ?Patient has no known allergies.   ? ?Review of Systems   ?Review of Systems ? ?Physical Exam ?Updated Vital Signs ?BP 115/78   Pulse 77   Temp 98.1 ?F (36.7 ?C) (Oral)   Resp 18   SpO2 98%  ?Physical Exam ?Vitals and nursing note reviewed.  ?Constitutional:   ?   General: She is in acute distress (Restless).  ?   Appearance: She is well-developed. She is obese. She is not ill-appearing.  ?HENT:  ?   Head: Normocephalic and atraumatic.  ?   Right Ear: External ear normal.  ?   Left Ear: External ear normal.  ?Eyes:  ?   Conjunctiva/sclera: Conjunctivae normal.  ?   Pupils: Pupils are equal, round, and reactive to light.  ?Neck:  ?   Trachea: Phonation normal.  ?Cardiovascular:  ?   Rate and Rhythm: Normal rate.  ?Pulmonary:  ?   Effort: Pulmonary effort is normal.  ?Abdominal:  ?   General: There is no distension.  ?Musculoskeletal:     ?   General: Normal range of motion.  ?   Cervical back: Normal range of motion and neck supple.  ?   Comments: Normal range of motion arms and  legs.  ?Skin: ?   General: Skin is warm and dry.  ?   Comments: Rash, right dermatome 3, 4, 5; appears to be healing with hyperpigmentation.  This is consistent with the history of shingles, diagnosed in January 2023.  This area is hyperesthetic to touch.  ?Neurological:  ?   Mental Status: She is alert and oriented to person, place, and time.  ?   Cranial Nerves: No cranial nerve deficit.  ?   Sensory: No sensory deficit.  ?   Motor: No abnormal muscle tone.  ?   Coordination: Coordination normal.  ?Psychiatric:     ?   Mood and Affect: Mood normal.     ?   Behavior: Behavior normal.     ?   Thought Content: Thought content normal.     ?   Judgment: Judgment normal.  ? ? ?ED Results /  Procedures / Treatments   ?Labs ?(all labs ordered are listed, but only abnormal results are displayed) ?Labs Reviewed  ?COMPREHENSIVE METABOLIC PANEL - Abnormal; Notable for the following components:  ?    Result Value  ? Glucose, Bld 101 (*)   ? All other components within normal limits  ?LIPASE, BLOOD - Abnormal; Notable for the following components:  ? Lipase 55 (*)   ? All other components within normal limits  ?URINALYSIS, ROUTINE W REFLEX MICROSCOPIC - Abnormal; Notable for the following components:  ? APPearance HAZY (*)   ? Hgb urine dipstick SMALL (*)   ? Leukocytes,Ua SMALL (*)   ? Bacteria, UA RARE (*)   ? All other components within normal limits  ?CBC WITH DIFFERENTIAL/PLATELET  ? ? ?EKG ?EKG Interpretation ? ?Date/Time:  Thursday October 19 2021 16:46:02 EDT ?Ventricular Rate:  98 ?PR Interval:  142 ?QRS Duration: 64 ?QT Interval:  326 ?QTC Calculation: 416 ?R Axis:   26 ?Text Interpretation: Normal sinus rhythm Minimal voltage criteria for LVH, may be normal variant ( R in aVL ) Borderline ECG When compared with ECG of 22-Mar-2016 23:56, PREVIOUS ECG IS PRESENT since last tracing no significant change Confirmed by Mancel Bale [id number]) on 10/19/2021 8:13:56 PM ? ?Radiology ?DG Chest 2 View ? ?Result Date: 10/19/2021 ?CLINICAL DATA:  Chest pain. EXAM: CHEST - 2 VIEW COMPARISON:  None. FINDINGS: The heart size and mediastinal contours are within normal limits. Both lungs are clear. The visualized skeletal structures are unremarkable. IMPRESSION: No active cardiopulmonary disease. Electronically Signed   By: Elgie Collard M.D.   On: 10/19/2021 19:42   ? ?Procedures ?Procedures  ? ? ?Medications Ordered in ED ?Medications - No data to display ? ?ED Course/ Medical Decision Making/ A&P ?  ?                        ?Medical Decision Making ?Patient presenting with ongoing symptoms consistent with postherpetic neuralgia.  Not currently being treated. ? ?Problems Addressed: ?Postherpetic neuralgia: chronic  illness or injury ? ?Amount and/or Complexity of Data Reviewed ?Labs: ordered. ?   Details: CBC, metabolic panel, urinalysis, lipase-essentially normal ?Radiology: ordered and independent interpretation performed. ?   Details: Chest x-ray-no infiltrate or edema. ? ?Risk ?Prescription drug management. ?Decision regarding hospitalization. ?Risk Details: Evaluation consistent with persistent neuralgia, secondary to herpes, right lower lumbar.  Patient did not tolerate gabapentin previously.  She will be given a short-term prescription for Percocet and referred to neurology ? ? ? ? ? ? ? ? ? ? ?Final Clinical Impression(s) / ED Diagnoses ?  Final diagnoses:  ?Postherpetic neuralgia  ? ? ?Rx / DC Orders ?ED Discharge Orders   ? ?      Ordered  ?  oxyCODONE-acetaminophen (PERCOCET/ROXICET) 5-325 MG tablet  Every 6 hours PRN       ? 10/19/21 2027  ?  Ambulatory referral to Neurology       ?Comments: An appointment is requested in approximately: 1 week; to be evaluated for postherpetic neuralgia  ? 10/19/21 2028  ? ?  ?  ? ?  ? ? ?  ?Mancel BaleWentz, Johanthan Kneeland, MD ?10/21/21 1326 ? ?

## 2021-10-19 NOTE — Discharge Instructions (Signed)
We are treating you with a pain medication, oxycodone to help you with the pain from the postherpetic neuralgia.  This is an inflammatory process after shingles.  Call the neurology office for a follow-up appointment to be seen for further care and treatment of it. ?

## 2021-10-19 NOTE — ED Provider Triage Note (Signed)
Emergency Medicine Provider Triage Evaluation Note ? ?Donnetta Hail , a 66 y.o. female  was evaluated in triage.  Pt complains of right lower quadrant abdominal pain and chest tightness.  Patient was diagnosed with shingles in January of this year.  States that her pain has dissipated but that she has internal pain which is severe.  This morning she began to have chest tightness and shortness of breath.  She denies nausea vomiting. ? ?Review of Systems  ?Positive: Right lower quadrant pain, chest tightness, shortness of breath ?Negative: Nausea, vomiting ? ?Physical Exam  ?BP (!) 132/92 (BP Location: Right Arm)   Pulse (!) 112   Temp 98.2 ?F (36.8 ?C) (Oral)   Resp 18   SpO2 94%  ?Gen:   Awake, no distress   ?Resp:  Normal effort  ?MSK:   Moves extremities without difficulty  ?Other:   ? ?Medical Decision Making  ?Medically screening exam initiated at 4:28 PM.  Appropriate orders placed.  AIZAH GEHLHAUSEN was informed that the remainder of the evaluation will be completed by another provider, this initial triage assessment does not replace that evaluation, and the importance of remaining in the ED until their evaluation is complete. ? ? ?  ?Darrick Grinder, PA-C ?10/19/21 1632 ? ?

## 2021-11-01 DIAGNOSIS — Z1211 Encounter for screening for malignant neoplasm of colon: Secondary | ICD-10-CM | POA: Diagnosis not present

## 2021-11-01 DIAGNOSIS — A048 Other specified bacterial intestinal infections: Secondary | ICD-10-CM | POA: Diagnosis not present

## 2021-11-02 ENCOUNTER — Encounter: Payer: Self-pay | Admitting: Diagnostic Neuroimaging

## 2021-11-02 ENCOUNTER — Ambulatory Visit (INDEPENDENT_AMBULATORY_CARE_PROVIDER_SITE_OTHER): Payer: Medicare HMO | Admitting: Diagnostic Neuroimaging

## 2021-11-02 VITALS — BP 114/84 | HR 71 | Ht 62.0 in | Wt 209.6 lb

## 2021-11-02 DIAGNOSIS — R1031 Right lower quadrant pain: Secondary | ICD-10-CM

## 2021-11-02 DIAGNOSIS — R21 Rash and other nonspecific skin eruption: Secondary | ICD-10-CM | POA: Diagnosis not present

## 2021-11-02 NOTE — Patient Instructions (Signed)
?  RIGHT LOWER QUADRANT ABDOMEN PAIN (since 2021) ?- unclear etiology; follow up with GI and PCP; however patient tells me she no longer has PCP; will refer to general surgery to rule out other causes (hernia, appendix, gall bladder, SBO etc) ?- does not sound like post-herpetic neuralgia, because pain started in 2021, and rash started in Jan 2023 ?- did not tolerate gabapentin; continue current pain meds ? ? ?RIGHT LOWER ABDOMEN RASH (Jan 2023, then resolved; now with new rash in lower back; ? cellulitis) ?- does not sound like shingles; also has posterior lumbar rash; more concern for cellulitis related to chronic steroids for adrenal insufficiency; will refer back to PCP, endocrinology, dermatology ?

## 2021-11-02 NOTE — Progress Notes (Signed)
? ?[facility] ? ?PATIENT: Tracy Larson ?DOB: [birth year] ? ?REFERRING CLINICIAN: Daleen Bo, MD ?HISTORY FROM: patient  ?REASON FOR VISIT: new consult ? ? ?HISTORICAL ? ?CHIEF COMPLAINT:  ?Chief Complaint  ?Patient presents with  ? Post herpetic neuralgia  ?  Rm 6 New Pt "shingles Jan 05/2022, GI has found bacteria imbedded in my intestines"  ? ? ?HISTORY OF PRESENT ILLNESS:  ? ? ?66 year old female here for evaluation of chronic right lower quadrant pain.  Symptoms started around 2021.  Initially this felt like mild pressure and pain sensations.  This is progressively worsened over time.  Now having intermittent sharp stabbing pain.  She feels pain internally.  Deep pressure in her right lower quadrant does trigger more pain.  She was following with PCP for evaluation. ? ?In January 2023 she went to the ER for evaluation of this right lower quadrant pain.  She was noted to have a rash in the right lower quadrant area and possibility of shingles was raised.  She was treated with valacyclovir and prednisone.  Symptoms did not improve.  Rash eventually resolved within a week or 2.  However patient having different type of rash in her lower back region. ? ?Patient has history of chronic adrenal insufficiency on medical therapy by endocrinology. ? ? ?REVIEW OF SYSTEMS: Full 14 system review of systems performed and negative with exception of: AS PER hpi. ? ?ALLERGIES: ?No Known Allergies ? ?HOME MEDICATIONS: ?Outpatient Medications Prior to Visit  ?Medication Sig Dispense Refill  ? Ascorbic Acid (VITAMIN C PO) Take 500 tablets by mouth in the morning and at bedtime.    ? Eflornithine HCl 13.9 % cream Use on desired area twice a day 45 g 1  ? hydrocortisone (CORTEF) 10 MG tablet TAKE 3 TABLETS BY MOUTH DAILY IN AM AND 2 TABLET IN  PM 360 tablet 1  ? hydrocortisone sodium succinate (SOLU-CORTEF) 100 MG SOLR injection Inject 100 mg as needed for adrenal crisis 3 each prn  ? Misc Natural Products  (GINSENG COMPLEX PO) Take 2 tablets by mouth daily. 500 mg per patient    ? omeprazole (PRILOSEC) 20 MG capsule Take 20 mg by mouth 2 (two) times daily.    ? oxyCODONE-acetaminophen (PERCOCET/ROXICET) 5-325 MG tablet Take 1 tablet by mouth every 6 (six) hours as needed for severe pain or moderate pain. 15 tablet 0  ? Saline Bacteriostatic (SODIUM CHLORIDE BACTERIOSTATIC) 0.9 % injection Inject 1 mL as directed as needed. 10 mL 3  ? Syringe/Needle, Disp, (SYRINGE 3CC/23GX1") 23G X 1" 3 ML MISC Use once every 2 weeks to inject testosterone 50 each 1  ? valACYclovir (VALTREX) 1000 MG tablet Take 1 tablet (1,000 mg total) by mouth 3 (three) times daily. 21 tablet 0  ? amoxicillin (AMOXIL) 500 MG tablet Take 1,000 mg by mouth 2 (two) times daily. (Patient not taking: Reported on 11/02/2021)    ? clarithromycin (BIAXIN) 500 MG tablet Take 500 mg by mouth 2 (two) times daily. (Patient not taking: Reported on 11/02/2021)    ? ?No facility-administered medications prior to visit.  ? ? ?PAST MEDICAL HISTORY: ?Past Medical History:  ?Diagnosis Date  ? Adrenal hyperplasia ([place])   ? Multiple gastric ulcers   ? Shingles   ? "internal"  ? ? ?PAST SURGICAL HISTORY: ?Past Surgical History:  ?Procedure Laterality Date  ? CESAREAN SECTION    ? ? ?FAMILY HISTORY: ?Family History  ?Problem Relation Age of Onset  ? Breast cancer Neg Hx   ? ? ?  SOCIAL HISTORY: ?Social History  ? ?Socioeconomic History  ? Marital status: Divorced  ?  Spouse name: Not on file  ? Number of children: 4  ? Years of education: Not on file  ? Highest education level: Some college, no degree  ?Occupational History  ? Not on file  ?Tobacco Use  ? Smoking status: Never  ? Smokeless tobacco: Never  ?Substance and Sexual Activity  ? Alcohol use: No  ? Drug use: No  ? Sexual activity: Not on file  ?Other Topics Concern  ? Not on file  ?Social History Narrative  ? Lives alone  ? ?Social Determinants of Health  ? ?Financial Resource Strain: Not on file  ?Food Insecurity: Not  on file  ?Transportation Needs: Not on file  ?Physical Activity: Not on file  ?Stress: Not on file  ?Social Connections: Not on file  ?Intimate Partner Violence: Not on file  ? ? ? ?PHYSICAL EXAM ? ?GENERAL EXAM/CONSTITUTIONAL: ?Vitals:  ?Vitals:  ? 11/02/21 1355  ?BP: 114/84  ?Pulse: 71  ?Weight: 209 lb 9.6 oz (95.1 kg)  ?Height: 5\' 2"  (1.575 m)  ? ?Body mass index is 38.34 kg/m?. ?Wt Readings from Last 3 Encounters:  ?11/02/21 209 lb 9.6 oz (95.1 kg)  ?07/29/20 220 lb 9.6 oz (100.1 kg)  ?03/31/20 224 lb (101.6 kg)  ? ?Patient is in no distress; well developed, nourished and groomed; neck is supple ? ?CARDIOVASCULAR: ?Examination of carotid arteries is normal; no carotid bruits ?Regular rate and rhythm, no murmurs ?Examination of peripheral vascular system by observation and palpation is normal ? ?EYES: ?Ophthalmoscopic exam of optic discs and posterior segments is normal; no papilledema or hemorrhages ?No results found. ? ?MUSCULOSKELETAL: ?Gait, strength, tone, movements noted in Neurologic exam below ? ?NEUROLOGIC: ?MENTAL STATUS:  ?   ? View : No data to display.  ?  ?  ?  ? ?awake, alert, oriented to person, place and time ?recent and remote memory intact ?normal attention and concentration ?language fluent, comprehension intact, naming intact ?fund of knowledge appropriate ? ?CRANIAL NERVE:  ?2nd - no papilledema on fundoscopic exam ?2nd, 3rd, 4th, 6th - pupils equal and reactive to light, visual fields full to confrontation, extraocular muscles intact, no nystagmus ?5th - facial sensation symmetric ?7th - facial strength symmetric ?8th - hearing intact ?9th - palate elevates symmetrically, uvula midline ?11th - shoulder shrug symmetric ?12th - tongue protrusion midline ? ?MOTOR:  ?normal bulk and tone, full strength in the BUE, BLE; LIMITED IN RLE DUE TO RLQ PAIN ? ?SENSORY:  ?normal and symmetric to light touch, temperature, vibration ? ?COORDINATION:  ?finger-nose-finger, fine finger movements  normal ? ?REFLEXES:  ?deep tendon reflexes TRACE and symmetric ? ?GAIT/STATION:  ?narrow based gait ? ? ? ? ?DIAGNOSTIC DATA (LABS, IMAGING, TESTING) ?- I reviewed patient records, labs, notes, testing and imaging myself where available. ? ?Lab Results  ?Component Value Date  ? WBC 6.2 10/19/2021  ? HGB 14.0 10/19/2021  ? HCT 43.8 10/19/2021  ? MCV 89.0 10/19/2021  ? PLT 273 10/19/2021  ? ?   ?Component Value Date/Time  ? NA 141 10/19/2021 1645  ? NA 141 07/17/2017 1722  ? K 4.2 10/19/2021 1645  ? CL 106 10/19/2021 1645  ? CO2 27 10/19/2021 1645  ? GLUCOSE 101 (H) 10/19/2021 1645  ? BUN 9 10/19/2021 1645  ? BUN 9 07/17/2017 1722  ? CREATININE 0.79 10/19/2021 1645  ? CREATININE 0.89 06/04/2014 1348  ? CALCIUM 9.3 10/19/2021 1645  ? PROT  7.3 10/19/2021 1645  ? ALBUMIN 3.8 10/19/2021 1645  ? AST 23 10/19/2021 1645  ? ALT 27 10/19/2021 1645  ? ALKPHOS 54 10/19/2021 1645  ? BILITOT 0.6 10/19/2021 1645  ? GFRNONAA >60 10/19/2021 1645  ? GFRNONAA 72 06/04/2014 1348  ? GFRAA 107 07/17/2017 1722  ? GFRAA 83 06/04/2014 1348  ? ?Lab Results  ?Component Value Date  ? CHOL 169 02/19/2014  ? HDL 42 02/19/2014  ? LDLCALC 102 (H) 02/19/2014  ? TRIG 124 02/19/2014  ? CHOLHDL 4.0 02/19/2014  ? ?Lab Results  ?Component Value Date  ? HGBA1C 5.7 02/19/2014  ? ?No results found for: VITAMINB12 ?Lab Results  ?Component Value Date  ? TSH 1.130 07/17/2017  ? ? ?11/27/14 MRI lumbar spine ?MRI LUMBAR SPINE: Grade 1 L4-5 anterolisthesis on degenerative basis ?without spondylolysis. No acute fracture. Probable L2-3 interspinous ?ligament strain. ?  ?Fibro lipomatosis changes of the filum terminale without cord ?tethering. ?  ?Moderate canal stenosis at L4-5, mild at L3-4 and L5-S1. ?  ?Neural foraminal narrowing L2-3 through L5-S1: Moderate to severe at ?L4-5. ?  ?L2-3 and L5-S1 annular fissures.  No suspicious enhancement. ? ? ? ?ASSESSMENT AND PLAN ? ?66 y.o. year old female here with: ? ? ?Dx: ? ?1. Rash   ?2. Right lower quadrant pain    ? ? ? ? ?PLAN: ? ?RIGHT LOWER QUADRANT ABDOMEN PAIN (since 2021) ?- unclear etiology; follow up with GI and PCP; however patient tells me she no longer has PCP; will refer to general surgery to rule out other cause

## 2021-11-06 ENCOUNTER — Telehealth: Payer: Self-pay | Admitting: Diagnostic Neuroimaging

## 2021-11-06 NOTE — Telephone Encounter (Signed)
Referral for General Surgery sent to [facility] [phone]. 

## 2021-11-06 NOTE — Telephone Encounter (Signed)
Referral for Internal Medicine sent to [facility] [phone]. ?

## 2021-11-06 NOTE — Telephone Encounter (Signed)
Referral for Dermatology sent to [facility] [phone]. ?

## 2021-11-30 DIAGNOSIS — R1031 Right lower quadrant pain: Secondary | ICD-10-CM | POA: Insufficient documentation

## 2021-12-06 ENCOUNTER — Other Ambulatory Visit: Payer: Self-pay | Admitting: General Surgery

## 2021-12-06 DIAGNOSIS — R109 Unspecified abdominal pain: Secondary | ICD-10-CM

## 2021-12-06 DIAGNOSIS — R1031 Right lower quadrant pain: Secondary | ICD-10-CM

## 2021-12-08 ENCOUNTER — Ambulatory Visit
Admission: RE | Admit: 2021-12-08 | Discharge: 2021-12-08 | Disposition: A | Discharge status: Home / Self Care | Payer: Medicare HMO | Source: Ambulatory Visit | Attending: General Surgery | Admitting: General Surgery

## 2021-12-08 DIAGNOSIS — R1031 Right lower quadrant pain: Secondary | ICD-10-CM

## 2021-12-08 DIAGNOSIS — R102 Pelvic and perineal pain: Secondary | ICD-10-CM | POA: Diagnosis not present

## 2021-12-08 DIAGNOSIS — N281 Cyst of kidney, acquired: Secondary | ICD-10-CM | POA: Diagnosis not present

## 2021-12-08 DIAGNOSIS — R109 Unspecified abdominal pain: Secondary | ICD-10-CM

## 2021-12-19 ENCOUNTER — Encounter (HOSPITAL_COMMUNITY): Payer: Self-pay | Admitting: *Deleted

## 2021-12-19 ENCOUNTER — Encounter (HOSPITAL_COMMUNITY): Payer: Self-pay

## 2021-12-19 ENCOUNTER — Other Ambulatory Visit: Payer: Self-pay

## 2021-12-19 ENCOUNTER — Emergency Department (HOSPITAL_COMMUNITY)
Admission: EM | Admit: 2021-12-19 | Discharge: 2021-12-20 | Disposition: A | Discharge status: Home / Self Care | Payer: Medicare HMO | Attending: Emergency Medicine | Admitting: Emergency Medicine

## 2021-12-19 ENCOUNTER — Ambulatory Visit (INDEPENDENT_AMBULATORY_CARE_PROVIDER_SITE_OTHER)
Admission: EM | Admit: 2021-12-19 | Discharge: 2021-12-19 | Disposition: A | Discharge status: Home / Self Care | Payer: Medicare HMO | Source: Home / Self Care

## 2021-12-19 DIAGNOSIS — N281 Cyst of kidney, acquired: Secondary | ICD-10-CM | POA: Diagnosis not present

## 2021-12-19 DIAGNOSIS — R350 Frequency of micturition: Secondary | ICD-10-CM | POA: Insufficient documentation

## 2021-12-19 DIAGNOSIS — R1031 Right lower quadrant pain: Secondary | ICD-10-CM | POA: Insufficient documentation

## 2021-12-19 DIAGNOSIS — R300 Dysuria: Secondary | ICD-10-CM | POA: Diagnosis not present

## 2021-12-19 DIAGNOSIS — N3001 Acute cystitis with hematuria: Secondary | ICD-10-CM | POA: Diagnosis not present

## 2021-12-19 DIAGNOSIS — R319 Hematuria, unspecified: Secondary | ICD-10-CM | POA: Diagnosis not present

## 2021-12-19 LAB — CBC
HCT: 43.5 % (ref 36.0–46.0)
Hemoglobin: 13.6 g/dL (ref 12.0–15.0)
MCH: 27.8 pg (ref 26.0–34.0)
MCHC: 31.3 g/dL (ref 30.0–36.0)
MCV: 88.8 fL (ref 80.0–100.0)
Platelets: 295 10*3/uL (ref 150–400)
RBC: 4.9 MIL/uL (ref 3.87–5.11)
RDW: 12.8 % (ref 11.5–15.5)
WBC: 8.5 10*3/uL (ref 4.0–10.5)
nRBC: 0 % (ref 0.0–0.2)

## 2021-12-19 LAB — POCT URINALYSIS DIPSTICK, ED / UC
Bilirubin Urine: NEGATIVE
Glucose, UA: NEGATIVE mg/dL
Ketones, ur: NEGATIVE mg/dL
Nitrite: NEGATIVE
Protein, ur: NEGATIVE mg/dL
Specific Gravity, Urine: <=1.005 (ref 1.005–1.030)
Urobilinogen, UA: 0.2 mg/dL (ref 0.0–1.0)
pH: 5.5 (ref 5.0–8.0)

## 2021-12-19 LAB — URINALYSIS, ROUTINE W REFLEX MICROSCOPIC
Bilirubin Urine: NEGATIVE
Glucose, UA: NEGATIVE mg/dL
Ketones, ur: NEGATIVE mg/dL
Nitrite: NEGATIVE
Protein, ur: NEGATIVE mg/dL
Specific Gravity, Urine: 1.006 (ref 1.005–1.030)
pH: 5 (ref 5.0–8.0)

## 2021-12-19 LAB — COMPREHENSIVE METABOLIC PANEL
ALT: 20 U/L (ref 0–44)
AST: 22 U/L (ref 15–41)
Albumin: 3.8 g/dL (ref 3.5–5.0)
Alkaline Phosphatase: 63 U/L (ref 38–126)
Anion gap: 11 (ref 5–15)
BUN: 8 mg/dL (ref 8–23)
CO2: 24 mmol/L (ref 22–32)
Calcium: 9.4 mg/dL (ref 8.9–10.3)
Chloride: 106 mmol/L (ref 98–111)
Creatinine, Ser: 0.7 mg/dL (ref 0.44–1.00)
GFR, Estimated: >60 mL/min (ref >60)
Glucose, Bld: 109 mg/dL — ABNORMAL HIGH (ref 70–99)
Potassium: 3.8 mmol/L (ref 3.5–5.1)
Sodium: 141 mmol/L (ref 135–145)
Total Bilirubin: 0.5 mg/dL (ref 0.3–1.2)
Total Protein: 7.9 g/dL (ref 6.5–8.1)

## 2021-12-19 LAB — LIPASE, BLOOD: Lipase: 50 U/L (ref 11–51)

## 2021-12-19 MED ORDER — PHENAZOPYRIDINE HCL 200 MG PO TABS: 200.0000 mg | ORAL_TABLET | Freq: Three times a day (TID) | ORAL | 0 refills | Status: DC

## 2021-12-19 MED ORDER — CEPHALEXIN 500 MG PO CAPS: 500.0000 mg | ORAL_CAPSULE | Freq: Two times a day (BID) | ORAL | 0 refills | Status: DC

## 2021-12-19 NOTE — ED Triage Notes (Signed)
Pt reports RLQ abd pain that radiates to her back x 1 year but is getting worse associated with nausea and chills. She was sent from Urgent Care today to have a CT scan to figure out what is going on. She also reports dysuria.

## 2021-12-19 NOTE — Discharge Instructions (Signed)
Please go to the nearest emergency department for evaluation for your abdomen pain, as you is already completed an ultrasound you will need a CT to further evaluate your organs

## 2021-12-19 NOTE — ED Provider Notes (Signed)
[facility]    CSN: [id number] Arrival date & time: 12/19/21  1701      History   Chief Complaint Chief Complaint  Patient presents with   UTI    HPI Tracy Larson is a 66 y.o. female.    Patient presents with dysuria, urinary frequency and urgency and weakened urinary stream beginning 1 day ago.  Patient endorses right lower quadrant abdominal pain occurring for greater than 6 months.  Endorses that the abdominal pain has worsened over the last week.  Has attempted use of tea tree oil and increase fluid intake which has been minimally effective.  Patient is followed by general surgery for management of right lower quadrant pain .  Denies hematuria, fever, chills, vaginal discharge, itching or odor, flank pain, nausea, vomiting, diarrhea.   Past Medical History:  Diagnosis Date   Adrenal hyperplasia (HCC)    Multiple gastric ulcers    Shingles    "internal"    Patient Active Problem List   Diagnosis Date Noted   Adrenal insufficiency (HCC) 07/29/2020   Chest pain 08/17/2017   Vitamin D deficiency 02/14/2016   Long term current use of systemic steroids 02/10/2015   Osteopenia 02/10/2015   Notalgia    Bilateral thoracic back pain    Absence of bladder continence    Fecal incontinence    Congenital adrenal hyperplasia (HCC)    Back pain 11/27/2014   Morbid obesity (HCC) 11/08/2014   Arthritis 06/04/2014    Past Surgical History:  Procedure Laterality Date   CESAREAN SECTION      OB History   No obstetric history on file.      Home Medications    Prior to Admission medications   Medication Sig Start Date End Date Taking? Authorizing Provider  amoxicillin (AMOXIL) 500 MG tablet Take 1,000 mg by mouth 2 (two) times daily. Patient not taking: Reported on 11/02/2021 11/01/21   [provider]  Ascorbic Acid (VITAMIN C PO) Take 500 tablets by mouth in the morning and at bedtime.    [provider]  clarithromycin (BIAXIN) 500 MG  tablet Take 500 mg by mouth 2 (two) times daily. Patient not taking: Reported on 11/02/2021 11/01/21   [provider]  Eflornithine HCl 13.9 % cream Use on desired area twice a day 05/02/21   [name], MD  hydrocortisone (CORTEF) 10 MG tablet TAKE 3 TABLETS BY MOUTH DAILY IN AM AND 2 TABLET IN  PM 09/25/21   [name], MD  hydrocortisone sodium succinate (SOLU-CORTEF) 100 MG SOLR injection Inject 100 mg as needed for adrenal crisis 03/31/20   [name], MD  Misc Natural Products [facility] COMPLEX PO) Take 2 tablets by mouth daily. 500 mg per patient    [provider]  omeprazole (PRILOSEC) 20 MG capsule Take 20 mg by mouth 2 (two) times daily. 11/01/21   [provider]  oxyCODONE-acetaminophen (PERCOCET/ROXICET) 5-325 MG tablet Take 1 tablet by mouth every 6 (six) hours as needed for severe pain or moderate pain. 10/19/21   Mancel Bale, MD  Saline Bacteriostatic (SODIUM CHLORIDE BACTERIOSTATIC) 0.9 % injection Inject 1 mL as directed as needed. 03/31/20   [name], MD  Syringe/Needle, Disp, (SYRINGE 3CC/23GX1") 23G X 1" 3 ML MISC Use once every 2 weeks to inject testosterone 03/31/20   [name], MD    Family History Family History  Problem Relation Age of Onset   Breast cancer Neg Hx     Social History Social History  Tobacco Use   Smoking status: Never   Smokeless tobacco: Never  Substance Use Topics   Alcohol use: No   Drug use: No     Allergies   Other   Review of Systems Review of Systems  Constitutional: Negative.   HENT: Negative.    Respiratory: Negative.    Cardiovascular: Negative.   Genitourinary:  Positive for dysuria, frequency, pelvic pain and urgency. Negative for decreased urine volume, difficulty urinating, dyspareunia, enuresis, flank pain, genital sores, hematuria, menstrual problem, vaginal bleeding, vaginal discharge and vaginal pain.  Skin: Negative.   Neurological: Negative.       Physical Exam Triage Vital Signs ED Triage Vitals  Enc Vitals Group     BP 12/19/21 1824 107/61     Pulse Rate 12/19/21 1824 77     Resp 12/19/21 1824 18     Temp 12/19/21 1824 98.3 F (36.8 C)     Temp src --      SpO2 12/19/21 1824 98 %     Weight --      Height --      Head Circumference --      Peak Flow --      Pain Score 12/19/21 1821 10     Pain Loc --      Pain Edu? --      Excl. in GC? --    No data found.  Updated Vital Signs BP 107/61   Pulse 77   Temp 98.3 F (36.8 C)   Resp 18   SpO2 98%   Visual Acuity Right Eye Distance:   Left Eye Distance:   Bilateral Distance:    Right Eye Near:   Left Eye Near:    Bilateral Near:     Physical Exam Constitutional:      Appearance: Normal appearance.  Eyes:     Extraocular Movements: Extraocular movements intact.  Pulmonary:     Effort: Pulmonary effort is normal.  Abdominal:     General: Abdomen is flat. Bowel sounds are normal.     Palpations: Abdomen is soft.     Tenderness: There is abdominal tenderness in the right lower quadrant. There is no right CVA tenderness or left CVA tenderness.  Skin:    General: Skin is warm and dry.  Neurological:     Mental Status: She is alert and oriented to person, place, and time. Mental status is at baseline.  Psychiatric:        Mood and Affect: Mood normal.        Behavior: Behavior normal.      UC Treatments / Results  Labs (all labs ordered are listed, but only abnormal results are displayed) Labs Reviewed  POCT URINALYSIS DIPSTICK, ED / UC - Abnormal; Notable for the following components:      Result Value   Hgb urine dipstick SMALL (*)    Leukocytes,Ua SMALL (*)    All other components within normal limits  URINE CULTURE    EKG   Radiology No results found.  Procedures Procedures (including critical care time)  Medications Ordered in UC Medications - No data to display  Initial Impression / Assessment and Plan / UC Course  I have  reviewed the triage vital signs and the nursing notes.  Pertinent labs & imaging results that were available during my care of the patient were reviewed by me and considered in my medical decision making (see chart for details).  Urinary frequency and right lower quadrant pain  Vital  signs are stable, patient has significant right lower quadrant abdominal pain and right-sided flank pain on exam, unable to sit comfortably in exam chair and is wincing with in the room, urinalysis is negative for infection, showing [place] Cohoon blood cells and hemoglobin, discussed findings with patient, patient declining STI testing, endorses that she is not sexually active, declined testing for yeast and bacterial vaginosis as well, based on significance of abdominal pain she is being sent to the nearest emergency department for further evaluation, completed recent abdominal ultrasound on 12/08/2021 which is negative for acute findings, patient to escort self Final Clinical Impressions(s) / UC Diagnoses   Final diagnoses:  None   Discharge Instructions   None    ED Prescriptions   None    PDMP not reviewed this encounter.   Valinda Hoar, NP 12/19/21 1927

## 2021-12-19 NOTE — ED Provider Triage Note (Signed)
Emergency Medicine Provider Triage Evaluation Note  Tracy Larson , a 66 y.o. female  was evaluated in triage.  Pt complains of right lower quadrant abdominal pain onset 1 year acutely worsening in January 2023.  Has associated nausea and chills.  Also notes subjective fever.  No meds tried prior to arrival.  Has dysuria as well.  Denies vomiting, hematuria.  Was sent from urgent care today to have a CT scan.  Review of Systems  Positive: As per HPI Negative:   Physical Exam  BP 121/70 (BP Location: Right Arm)   Pulse 74   Temp 98.1 F (36.7 C) (Oral)   Resp 17   Ht 5\' 2"  (1.575 m)   Wt 90.7 kg   SpO2 98%   BMI 36.58 kg/m  Gen:   Awake, no distress   Resp:  Normal effort  MSK:   Moves extremities without difficulty  Other:  Right CVA tenderness to palpation.  Unable to determine abdominal tenderness to palpation as patient is jumpy during exam.  Medical Decision Making  Medically screening exam initiated at 8:03 PM.  Appropriate orders placed.  [name] was informed that the remainder of the evaluation will be completed by another provider, this initial triage assessment does not replace that evaluation, and the importance of remaining in the ED until their evaluation is complete.  Work-up initiated   Zaire Levesque A, PA-C 12/19/21 2004

## 2021-12-19 NOTE — ED Triage Notes (Signed)
Pt reports she has a UTI that started yesterday. Pt drank tea tree oil and lots of water. Pt still has dysuria

## 2021-12-20 ENCOUNTER — Emergency Department (HOSPITAL_COMMUNITY): Payer: Medicare HMO

## 2021-12-20 DIAGNOSIS — R300 Dysuria: Secondary | ICD-10-CM | POA: Diagnosis not present

## 2021-12-20 DIAGNOSIS — N281 Cyst of kidney, acquired: Secondary | ICD-10-CM | POA: Diagnosis not present

## 2021-12-20 DIAGNOSIS — R319 Hematuria, unspecified: Secondary | ICD-10-CM | POA: Diagnosis not present

## 2021-12-20 DIAGNOSIS — N3001 Acute cystitis with hematuria: Secondary | ICD-10-CM | POA: Diagnosis not present

## 2021-12-20 MED ORDER — CEPHALEXIN 250 MG PO CAPS
500.0000 mg | ORAL_CAPSULE | Freq: Once | ORAL | Status: AC
  Administered 2021-12-20: 500 mg via ORAL
  Filled 2021-12-20: qty 2

## 2021-12-20 MED ORDER — CEPHALEXIN 500 MG PO CAPS: 500.0000 mg | ORAL_CAPSULE | Freq: Three times a day (TID) | ORAL | 0 refills | Status: DC

## 2021-12-20 MED ORDER — PHENAZOPYRIDINE HCL 95 MG PO TABS: 95.0000 mg | ORAL_TABLET | Freq: Three times a day (TID) | ORAL | 0 refills | Status: DC | PRN

## 2021-12-20 NOTE — ED Provider Notes (Signed)
[facility] EMERGENCY DEPARTMENT Provider Note   CSN: [id number] Arrival date & time: 12/19/21  1928     History  Chief Complaint  Patient presents with   Abdominal Pain    Tracy Larson is a 66 y.o. female.  The history is provided by the patient and medical records.  Abdominal Pain Associated symptoms: dysuria and hematuria    66 year old female with history of adrenal insufficiency, obesity, vitamin D deficiency, chronic abdominal pain, presenting to the ED with right-sided flank and abdominal pain.  States over the past 24 hours she has had new onset of dysuria, cloudy urine, and right-sided flank pain.  At baseline she has pain in her right lower abdomen, however feels this more in her back and in her genital region.  Denies vaginal discharge or bleeding. She is not sexually active.  States while in the ER lobby she did start having blood in her urine as well.  She was seen at urgent care earlier today and told she likely had UTI, sent to the ER for further evaluation.  She denies any fever or chills.  No nausea or vomiting, she has been eating and drinking well.  Bowel movements are normal.  She does follow with general surgery for ongoing right lower abdominal pain, again notes that current pain is different.  Home Medications Prior to Admission medications   Medication Sig Start Date End Date Taking? Authorizing Provider  amoxicillin (AMOXIL) 500 MG tablet Take 1,000 mg by mouth 2 (two) times daily. Patient not taking: Reported on 11/02/2021 11/01/21   [provider]  Ascorbic Acid (VITAMIN C PO) Take 500 tablets by mouth in the morning and at bedtime.    [provider]  cephALEXin (KEFLEX) 500 MG capsule Take 1 capsule (500 mg total) by mouth 2 (two) times daily for 5 days. 12/19/21 12/24/21  Valinda Hoar, NP  clarithromycin (BIAXIN) 500 MG tablet Take 500 mg by mouth 2 (two) times daily. Patient not taking: Reported on 11/02/2021 11/01/21    [provider]  Eflornithine HCl 13.9 % cream Use on desired area twice a day 05/02/21   [name], MD  hydrocortisone (CORTEF) 10 MG tablet TAKE 3 TABLETS BY MOUTH DAILY IN AM AND 2 TABLET IN  PM 09/25/21   [name], MD  hydrocortisone sodium succinate (SOLU-CORTEF) 100 MG SOLR injection Inject 100 mg as needed for adrenal crisis 03/31/20   [name], MD  Misc Natural Products [facility] COMPLEX PO) Take 2 tablets by mouth daily. 500 mg per patient    [provider]  omeprazole (PRILOSEC) 20 MG capsule Take 20 mg by mouth 2 (two) times daily. 11/01/21   [provider]  oxyCODONE-acetaminophen (PERCOCET/ROXICET) 5-325 MG tablet Take 1 tablet by mouth every 6 (six) hours as needed for severe pain or moderate pain. 10/19/21   Mancel Bale, MD  phenazopyridine (PYRIDIUM) 200 MG tablet Take 1 tablet (200 mg total) by mouth 3 (three) times daily. 12/19/21   White, Elita Boone, NP  Saline Bacteriostatic (SODIUM CHLORIDE BACTERIOSTATIC) 0.9 % injection Inject 1 mL as directed as needed. 03/31/20   [name], MD  Syringe/Needle, Disp, (SYRINGE 3CC/23GX1") 23G X 1" 3 ML MISC Use once every 2 weeks to inject testosterone 03/31/20   [name], MD      Allergies    Other    Review of Systems   Review of Systems  Gastrointestinal:  Positive for abdominal pain.  Genitourinary:  Positive for dysuria  and hematuria.  All other systems reviewed and are negative.   Physical Exam Updated Vital Signs BP 124/66 (BP Location: Right Arm)   Pulse 79   Temp 98.4 F (36.9 C) (Axillary)   Resp 17   Ht 5\' 2"  (1.575 m)   Wt 90.7 kg   SpO2 97%   BMI 36.58 kg/m   Physical Exam Vitals and nursing note reviewed.  Constitutional:      Appearance: She is well-developed.  HENT:     Head: Normocephalic and atraumatic.  Eyes:     Conjunctiva/sclera: Conjunctivae normal.     Pupils: Pupils are equal, round, and reactive to light.  Cardiovascular:      Rate and Rhythm: Normal rate and regular rhythm.     Heart sounds: Normal heart sounds.  Pulmonary:     Effort: Pulmonary effort is normal.     Breath sounds: Normal breath sounds.  Abdominal:     General: Bowel sounds are normal.     Palpations: Abdomen is soft.     Tenderness: There is no abdominal tenderness. There is no rebound. Negative signs include Murphy's sign.  Musculoskeletal:        General: Normal range of motion.     Cervical back: Normal range of motion.  Skin:    General: Skin is warm and dry.  Neurological:     Mental Status: She is alert and oriented to person, place, and time.     ED Results / Procedures / Treatments   Labs (all labs ordered are listed, but only abnormal results are displayed) Labs Reviewed  COMPREHENSIVE METABOLIC PANEL - Abnormal; Notable for the following components:      Result Value   Glucose, Bld 109 (*)    All other components within normal limits  URINALYSIS, ROUTINE W REFLEX MICROSCOPIC - Abnormal; Notable for the following components:   APPearance HAZY (*)    Hgb urine dipstick SMALL (*)    Leukocytes,Ua LARGE (*)    Bacteria, UA MANY (*)    All other components within normal limits  URINE CULTURE  LIPASE, BLOOD  CBC    EKG None  Radiology CT Renal Stone Study  Result Date: 12/20/2021 CLINICAL DATA:  Dysuria, hematuria, and right flank pain. EXAM: CT ABDOMEN AND PELVIS WITHOUT CONTRAST TECHNIQUE: Multidetector CT imaging of the abdomen and pelvis was performed following the standard protocol without IV contrast. RADIATION DOSE REDUCTION: This exam was performed according to the departmental dose-optimization program which includes automated exposure control, adjustment of the mA and/or kV according to patient size and/or use of iterative reconstruction technique. COMPARISON:  11/10/2014 FINDINGS: Lower chest: Unremarkable. Hepatobiliary: No suspicious focal abnormality in the liver on this study without intravenous contrast.  There is no evidence for gallstones, gallbladder wall thickening, or pericholecystic fluid. No intrahepatic or extrahepatic biliary dilation. Pancreas: No focal mass lesion. No dilatation of the main duct. No intraparenchymal cyst. No peripancreatic edema. Spleen: No splenomegaly. No focal mass lesion. Adrenals/Urinary Tract: No adrenal nodule or mass. 9 mm exophytic lesion upper pole right kidney on 25/3 is new in the interval with attenuation too high to be a simple cyst. Similar subcapsular 8 mm hyperattenuating lesion noted interpolar left kidney on 33/3. No renal or ureteral calculi. No secondary changes in either kidney or ureter. No stones in the urinary bladder. Several tiny gas bubbles are seen in the bladder lumen, presumably from recent instrumentation. Stomach/Bowel: Stomach is unremarkable. No gastric wall thickening. No evidence of outlet obstruction. Duodenum is  normally positioned as is the ligament of Treitz. No small bowel wall thickening. No small bowel dilatation. The terminal ileum is normal. The appendix is normal. No gross colonic mass. No colonic wall thickening. Vascular/Lymphatic: No abdominal aortic aneurysm. No abdominal aortic atherosclerotic calcification. There is no gastrohepatic or hepatoduodenal ligament lymphadenopathy. No retroperitoneal or mesenteric lymphadenopathy. No pelvic sidewall lymphadenopathy. Reproductive: Unremarkable. Other: No intraperitoneal free fluid. Musculoskeletal: No worrisome lytic or sclerotic osseous abnormality. IMPRESSION: 1. No acute findings in the abdomen or pelvis. Specifically, no findings to explain the patient's history of hematuria. 2. Several tiny gas bubbles are seen in the bladder lumen, presumably from recent instrumentation. In the absence of recent instrumentation, bladder infection should be considered. 3. 9 mm exophytic lesion upper pole right kidney and similar 8 mm subcapsular hyperattenuating lesion noted interpolar left kidney. These  are likely cysts complicated by proteinaceous debris or hemorrhage, but nonemergent follow-up abdominal MRI with and without contrast recommended to confirm. Electronically Signed   By: Kennith Center M.D.   On: 12/20/2021 06:06    Procedures Procedures    Medications Ordered in ED Medications  cephALEXin (KEFLEX) capsule 500 mg (has no administration in time range)    ED Course/ Medical Decision Making/ A&P                           Medical Decision Making Amount and/or Complexity of Data Reviewed Labs: ordered. Radiology: ordered and independent interpretation performed. ECG/medicine tests: ordered and independent interpretation performed.  Risk Prescription drug management.   66 y.o. F here with dysuria, hematuria, and right sided flank pain for the past 24 hours.  Seen at [facility], told likely UTI but sent here for CT scan.  She denies hx of kidney stones.    Unfortunately, patient with prolonged wait time of nearly 10 hours by time of my evaluation.  Biggest complaint now is dysuria and hematuria.  She reports pain in genital area and right flank.  Labs were obtained from triage--no leukocytosis, no electrolyte derangement.  Lipase is normal.  UA does appear infectious with many bacteria, large leuks, 21-50 WBC's, small hemoglobin.  CT renal study was obtained, no obstructive uropathy.  Does have tiny gas bubbles in the bladder, likely secondary to infection.  Also has incidental finding of likely renal cyst bilaterally recommended for non-emergent MRI follow-up.  Patient was made aware of these findings on CT. She has previously scheduled follow-up with general surgery on 01/12/2022 for ongoing evaluation of continued right-sided abdominal pain, will have surgery review CT scan from today's visit and arrange MRI.  Will start on Keflex for UTI, first dose given here in the ED.  Discharge home with continued symptomatic care.  She should return here for any new or acute changes including  difficulty urinating, uncontrolled fever, vomiting, etc.  Final Clinical Impression(s) / ED Diagnoses Final diagnoses:  Acute cystitis with hematuria  Dysuria    Rx / DC Orders ED Discharge Orders          Ordered    cephALEXin (KEFLEX) 500 MG capsule  3 times daily        12/20/21 0620              Garlon Hatchet, PA-C 12/20/21 [id number]    Gilda Crease, MD 12/20/21 [phone]

## 2021-12-20 NOTE — Discharge Instructions (Signed)
Take the prescribed medication as directed.  Make sure to finish all the antibiotic. As we discussed, some cysts on the kidney that needs to be followed up.  Primary care or surgery can do this for you so please have them review the results from CT scan. Return to the ED for new or worsening symptoms.

## 2021-12-20 NOTE — ED Notes (Signed)
Patient verbalizes understanding of discharge instructions. Opportunity for questioning and answers were provided. Armband removed by staff, pt discharged from ED ambulatory.   

## 2021-12-20 NOTE — ED Notes (Signed)
Pt refused discharge vitals 

## 2021-12-21 LAB — URINE CULTURE: Culture: 30000 — AB

## 2021-12-22 LAB — URINE CULTURE: Culture: >=100000 — AB

## 2021-12-23 ENCOUNTER — Telehealth (HOSPITAL_BASED_OUTPATIENT_CLINIC_OR_DEPARTMENT_OTHER): Payer: Self-pay | Admitting: *Deleted

## 2022-01-12 DIAGNOSIS — N289 Disorder of kidney and ureter, unspecified: Secondary | ICD-10-CM | POA: Diagnosis not present

## 2022-01-12 DIAGNOSIS — R1031 Right lower quadrant pain: Secondary | ICD-10-CM | POA: Diagnosis not present

## 2022-01-12 DIAGNOSIS — R102 Pelvic and perineal pain unspecified side: Secondary | ICD-10-CM | POA: Insufficient documentation

## 2022-01-12 DIAGNOSIS — N3000 Acute cystitis without hematuria: Secondary | ICD-10-CM | POA: Diagnosis not present

## 2022-01-12 DIAGNOSIS — N390 Urinary tract infection, site not specified: Secondary | ICD-10-CM | POA: Insufficient documentation

## 2022-01-16 ENCOUNTER — Emergency Department (HOSPITAL_COMMUNITY)
Admission: EM | Admit: 2022-01-16 | Discharge: 2022-01-17 | Disposition: A | Discharge status: Home / Self Care | Payer: Medicare HMO | Attending: Emergency Medicine | Admitting: Emergency Medicine

## 2022-01-16 DIAGNOSIS — R1013 Epigastric pain: Secondary | ICD-10-CM | POA: Diagnosis not present

## 2022-01-16 DIAGNOSIS — L500 Allergic urticaria: Secondary | ICD-10-CM | POA: Insufficient documentation

## 2022-01-16 DIAGNOSIS — R000 Tachycardia, unspecified: Secondary | ICD-10-CM | POA: Diagnosis not present

## 2022-01-16 DIAGNOSIS — R0789 Other chest pain: Secondary | ICD-10-CM | POA: Diagnosis not present

## 2022-01-16 DIAGNOSIS — R0600 Dyspnea, unspecified: Secondary | ICD-10-CM | POA: Insufficient documentation

## 2022-01-16 DIAGNOSIS — R21 Rash and other nonspecific skin eruption: Secondary | ICD-10-CM | POA: Insufficient documentation

## 2022-01-16 DIAGNOSIS — N3090 Cystitis, unspecified without hematuria: Secondary | ICD-10-CM | POA: Diagnosis not present

## 2022-01-16 DIAGNOSIS — R0602 Shortness of breath: Secondary | ICD-10-CM | POA: Diagnosis not present

## 2022-01-16 DIAGNOSIS — L509 Urticaria, unspecified: Secondary | ICD-10-CM | POA: Diagnosis not present

## 2022-01-16 NOTE — ED Triage Notes (Signed)
Pt w "all over rash" beginning today approx 5pm, chest tightness. Recently began dicyclomine 7/14. States she had liver w dinner, began feeling "strange, like the two were counteracting each other." Denies difficulty swallowing, tongue swelling, additional symptoms.

## 2022-01-17 ENCOUNTER — Encounter (HOSPITAL_COMMUNITY): Payer: Self-pay | Admitting: Emergency Medicine

## 2022-01-17 ENCOUNTER — Ambulatory Visit (INDEPENDENT_AMBULATORY_CARE_PROVIDER_SITE_OTHER)
Admission: EM | Admit: 2022-01-17 | Discharge: 2022-01-17 | Disposition: A | Discharge status: Home / Self Care | Payer: Medicare HMO | Source: Home / Self Care | Attending: Family Medicine | Admitting: Family Medicine

## 2022-01-17 ENCOUNTER — Telehealth: Payer: Self-pay

## 2022-01-17 ENCOUNTER — Emergency Department (HOSPITAL_COMMUNITY): Payer: Medicare HMO

## 2022-01-17 DIAGNOSIS — R0789 Other chest pain: Secondary | ICD-10-CM | POA: Diagnosis not present

## 2022-01-17 DIAGNOSIS — R0602 Shortness of breath: Secondary | ICD-10-CM | POA: Diagnosis not present

## 2022-01-17 DIAGNOSIS — L500 Allergic urticaria: Secondary | ICD-10-CM | POA: Insufficient documentation

## 2022-01-17 DIAGNOSIS — N3090 Cystitis, unspecified without hematuria: Secondary | ICD-10-CM | POA: Insufficient documentation

## 2022-01-17 LAB — CBC WITH DIFFERENTIAL/PLATELET
Abs Immature Granulocytes: 0.03 10*3/uL (ref 0.00–0.07)
Basophils Absolute: 0 10*3/uL (ref 0.0–0.1)
Basophils Relative: 0 %
Eosinophils Absolute: 0 10*3/uL (ref 0.0–0.5)
Eosinophils Relative: 0 %
HCT: 45.1 % (ref 36.0–46.0)
Hemoglobin: 14.7 g/dL (ref 12.0–15.0)
Immature Granulocytes: 0 %
Lymphocytes Relative: 12 %
Lymphs Abs: 1 10*3/uL (ref 0.7–4.0)
MCH: 28.4 pg (ref 26.0–34.0)
MCHC: 32.6 g/dL (ref 30.0–36.0)
MCV: 87.1 fL (ref 80.0–100.0)
Monocytes Absolute: 0.1 10*3/uL (ref 0.1–1.0)
Monocytes Relative: 1 %
Neutro Abs: 7.5 10*3/uL (ref 1.7–7.7)
Neutrophils Relative %: 87 %
Platelets: 248 10*3/uL (ref 150–400)
RBC: 5.18 MIL/uL — ABNORMAL HIGH (ref 3.87–5.11)
RDW: 13.2 % (ref 11.5–15.5)
WBC: 8.7 10*3/uL (ref 4.0–10.5)
nRBC: 0 % (ref 0.0–0.2)

## 2022-01-17 LAB — COMPREHENSIVE METABOLIC PANEL
ALT: 27 U/L (ref 0–44)
AST: 33 U/L (ref 15–41)
Albumin: 3.8 g/dL (ref 3.5–5.0)
Alkaline Phosphatase: 63 U/L (ref 38–126)
Anion gap: 15 (ref 5–15)
BUN: 11 mg/dL (ref 8–23)
CO2: 22 mmol/L (ref 22–32)
Calcium: 9.7 mg/dL (ref 8.9–10.3)
Chloride: 106 mmol/L (ref 98–111)
Creatinine, Ser: 0.83 mg/dL (ref 0.44–1.00)
GFR, Estimated: >60 mL/min (ref >60)
Glucose, Bld: 145 mg/dL — ABNORMAL HIGH (ref 70–99)
Potassium: 4.5 mmol/L (ref 3.5–5.1)
Sodium: 143 mmol/L (ref 135–145)
Total Bilirubin: 0.8 mg/dL (ref 0.3–1.2)
Total Protein: 8.1 g/dL (ref 6.5–8.1)

## 2022-01-17 LAB — LIPASE, BLOOD: Lipase: 38 U/L (ref 11–51)

## 2022-01-17 LAB — TROPONIN I (HIGH SENSITIVITY)
Troponin I (High Sensitivity): 3 ng/L (ref <18)
Troponin I (High Sensitivity): 3 ng/L (ref <18)

## 2022-01-17 LAB — BRAIN NATRIURETIC PEPTIDE: B Natriuretic Peptide: 19.6 pg/mL (ref 0.0–100.0)

## 2022-01-17 MED ORDER — FAMOTIDINE 20 MG PO TABS: 20.0000 mg | ORAL_TABLET | Freq: Two times a day (BID) | ORAL | 0 refills | Status: DC

## 2022-01-17 MED ORDER — CIPROFLOXACIN HCL 250 MG PO TABS: 250.0000 mg | ORAL_TABLET | Freq: Two times a day (BID) | ORAL | 0 refills | Status: DC

## 2022-01-17 MED ORDER — DIPHENHYDRAMINE HCL 50 MG/ML IJ SOLN
25.0000 mg | Freq: Once | INTRAMUSCULAR | Status: AC
  Administered 2022-01-17: 25 mg via INTRAVENOUS
  Filled 2022-01-17: qty 1

## 2022-01-17 MED ORDER — FAMOTIDINE IN NACL 20-0.9 MG/50ML-% IV SOLN
20.0000 mg | Freq: Once | INTRAVENOUS | Status: AC
  Administered 2022-01-17: 20 mg via INTRAVENOUS
  Filled 2022-01-17: qty 50

## 2022-01-17 MED ORDER — CIPROFLOXACIN HCL 500 MG PO TABS: 500.0000 mg | ORAL_TABLET | Freq: Two times a day (BID) | ORAL | 0 refills | Status: DC

## 2022-01-17 MED ORDER — LORAZEPAM 2 MG/ML IJ SOLN: 1.0000 mg | Freq: Once | INTRAMUSCULAR | Status: DC

## 2022-01-17 MED ORDER — FENTANYL CITRATE PF 50 MCG/ML IJ SOSY
50.0000 ug | PREFILLED_SYRINGE | Freq: Once | INTRAMUSCULAR | Status: AC
  Administered 2022-01-17: 50 ug via INTRAVENOUS
  Filled 2022-01-17: qty 1

## 2022-01-17 MED ORDER — DIPHENHYDRAMINE HCL 50 MG/ML IJ SOLN
25.0000 mg | Freq: Once | INTRAMUSCULAR | Status: DC
  Filled 2022-01-17: qty 1

## 2022-01-17 MED ORDER — DICYCLOMINE HCL 10 MG/5ML PO SOLN
10.0000 mg | Freq: Once | ORAL | Status: AC
  Administered 2022-01-17: 10 mg via ORAL
  Filled 2022-01-17: qty 5

## 2022-01-17 MED ORDER — ALUM & MAG HYDROXIDE-SIMETH 200-200-20 MG/5ML PO SUSP
30.0000 mL | Freq: Once | ORAL | Status: AC
  Administered 2022-01-17: 30 mL via ORAL
  Filled 2022-01-17: qty 30

## 2022-01-17 MED ORDER — LORAZEPAM 2 MG/ML IJ SOLN: 0.5000 mg | Freq: Once | INTRAMUSCULAR | Status: DC

## 2022-01-17 MED ORDER — DIPHENHYDRAMINE HCL 25 MG PO TABS: 25.0000 mg | ORAL_TABLET | Freq: Four times a day (QID) | ORAL | 0 refills | Status: DC | PRN

## 2022-01-17 MED ORDER — ONDANSETRON HCL 4 MG/2ML IJ SOLN
4.0000 mg | Freq: Once | INTRAMUSCULAR | Status: AC
  Administered 2022-01-17: 4 mg via INTRAVENOUS
  Filled 2022-01-17: qty 2

## 2022-01-17 MED ORDER — METHYLPREDNISOLONE SODIUM SUCC 125 MG IJ SOLR
125.0000 mg | Freq: Once | INTRAMUSCULAR | Status: AC
  Administered 2022-01-17: 125 mg via INTRAVENOUS
  Filled 2022-01-17: qty 2

## 2022-01-17 MED ORDER — ALBUTEROL SULFATE HFA 108 (90 BASE) MCG/ACT IN AERS
2.0000 | INHALATION_SPRAY | Freq: Once | RESPIRATORY_TRACT | Status: AC
  Administered 2022-01-17: 2 via RESPIRATORY_TRACT
  Filled 2022-01-17: qty 6.7

## 2022-01-17 NOTE — Telephone Encounter (Signed)
Received inbound call from patient stating she is "breaking out in hives" again, patient reports she is no longer having shortness of breath. RNCM questioned patient if she is taking her medications that was prescribed at discharge. Patient reports she did not pick up her medications from the pharmacy. Patient then reports she was told by her surgeon to never take benadryl. Patient was unsure as to why she was told not to take. Per chart review patient felt better after taking benadryl. This RNCM encouraged patient to pick up her medications, or go to urgent care or ED if she starts to feel worst after taking her prescribed medications from last ED visit.   No additional TOC needs at this time.

## 2022-01-17 NOTE — ED Provider Notes (Signed)
Accepted handoff at shift change from RS PA-C. Please see prior provider note for more detail.   Briefly: Patient is 66 y.o. per prior provider note:   "Tracy Larson is a 66 y.o. female who presents with "rash all over her body" started around 5 PM with associated chest tightness.  Patient has been taking dicyclomine for over a week with significant proved in her chronic abdominal pain without reaction.  She states that she took a dose of this before dinner and then subsequently ate a liver with her dinner and began to feel itchy after that.  Denies any difficulty swallowing nausea vomiting, tongue swelling, sore throat, lightheadedness, or syncope.   Primary concern at this time is a cramping sensation in her epigastrium/lower chest that lasts for few seconds at a time.  Patient appears exceptionally anxious. I personally this patient medical records.  She is history of congenital adrenal hyperplasia and took 30 mg of Solu-Cortef prior to arrival.  Also has history of shingles in January with persistent postherpetic neuralgia on the right flank.  She is not anticoagulated."    DDX: concern for   Plan: re-assess. Likely DC home     Physical Exam  BP 117/65   Pulse 87   Temp 99.2 F (37.3 C)   Resp 19   SpO2 96%   Physical Exam  Procedures  Procedures  ED Course / MDM    Medical Decision Making Amount and/or Complexity of Data Reviewed Labs: ordered. Radiology: ordered.  Risk OTC drugs. Prescription drug management.   Reassessed. Pt is feeling better after benadryl and other meds from prior provider.   Will DC home now.     Gailen Shelter, [place] 01/17/22 0919    Melene Plan, DO 01/17/22 1128

## 2022-01-17 NOTE — ED Provider Notes (Signed)
[facility]    CSN: [id number] Arrival date & time: 01/17/22  1416      History   Chief Complaint Chief Complaint  Patient presents with   Urticaria    HPI Tracy Larson is a 66 y.o. female.    Urticaria   Here for rash and itching that began yesterday.  She was seen in the ER this morning and was prescribed Pepcid and Benadryl but she has been told not to take Benadryl so she comes here to be seen again.  She is taking dicyclomine for stomach cramps and is concerned that sweats causing the allergy.  She did take extra of her hydrocortisone that she takes for hypoadrenalism twice today already.  She also wants to be seen for persistent urinary symptoms.  She is having dysuria and urinary frequency.  She was treated with Keflex on June 20.  On culture that E. coli was pansensitive  Past Medical History:  Diagnosis Date   Adrenal hyperplasia (HCC)    Multiple gastric ulcers    Shingles    "internal"    Patient Active Problem List   Diagnosis Date Noted   Adrenal insufficiency (HCC) 07/29/2020   Chest pain 08/17/2017   Vitamin D deficiency 02/14/2016   Long term current use of systemic steroids 02/10/2015   Osteopenia 02/10/2015   Notalgia    Bilateral thoracic back pain    Absence of bladder continence    Fecal incontinence    Congenital adrenal hyperplasia (HCC)    Back pain 11/27/2014   Morbid obesity (HCC) 11/08/2014   Arthritis 06/04/2014    Past Surgical History:  Procedure Laterality Date   CESAREAN SECTION      OB History   No obstetric history on file.      Home Medications    Prior to Admission medications   Medication Sig Start Date End Date Taking? Authorizing Provider  ciprofloxacin (CIPRO) 250 MG tablet Take 1 tablet (250 mg total) by mouth every 12 (twelve) hours. 01/17/22  Yes Zenia Resides, MD  famotidine (PEPCID) 20 MG tablet Take 1 tablet (20 mg total) by mouth 2 (two) times daily. 01/17/22  Yes Zenia Resides,  MD  Ascorbic Acid (VITAMIN C PO) Take 500 tablets by mouth in the morning and at bedtime.    [provider]  Eflornithine HCl 13.9 % cream Use on desired area twice a day 05/02/21   [name], MD  hydrocortisone (CORTEF) 10 MG tablet TAKE 3 TABLETS BY MOUTH DAILY IN AM AND 2 TABLET IN  PM 09/25/21   [name], MD  hydrocortisone sodium succinate (SOLU-CORTEF) 100 MG SOLR injection Inject 100 mg as needed for adrenal crisis 03/31/20   [name], MD  Misc Natural Products [facility] COMPLEX PO) Take 2 tablets by mouth daily. 500 mg per patient    [provider]  omeprazole (PRILOSEC) 20 MG capsule Take 20 mg by mouth 2 (two) times daily. 11/01/21   [provider]  oxyCODONE-acetaminophen (PERCOCET/ROXICET) 5-325 MG tablet Take 1 tablet by mouth every 6 (six) hours as needed for severe pain or moderate pain. 10/19/21   Mancel Bale, MD  phenazopyridine (PYRIDIUM) 95 MG tablet Take 1 tablet (95 mg total) by mouth 3 (three) times daily as needed (dysuria). 12/20/21   Garlon Hatchet, PA-C  Saline Bacteriostatic (SODIUM CHLORIDE BACTERIOSTATIC) 0.9 % injection Inject 1 mL as directed as needed. 03/31/20   [name], MD  Syringe/Needle, Disp, (SYRINGE 3CC/23GX1") 23G X 1"  3 ML MISC Use once every 2 weeks to inject testosterone 03/31/20   [name], MD    Family History Family History  Problem Relation Age of Onset   Breast cancer Neg Hx     Social History Social History   Tobacco Use   Smoking status: Never   Smokeless tobacco: Never  Substance Use Topics   Alcohol use: No   Drug use: No     Allergies   Other   Review of Systems Review of Systems   Physical Exam Triage Vital Signs ED Triage Vitals  Enc Vitals Group     BP 01/17/22 1438 128/62     Pulse Rate 01/17/22 1438 (!) 110     Resp 01/17/22 1438 20     Temp 01/17/22 1438 98.2 F (36.8 C)     Temp Source 01/17/22 1438 Oral     SpO2 01/17/22 1438 94 %      Weight --      Height --      Head Circumference --      Peak Flow --      Pain Score 01/17/22 1437 0     Pain Loc --      Pain Edu? --      Excl. in GC? --    No data found.  Updated Vital Signs BP 128/62 (BP Location: Right Arm)   Pulse (!) 110   Temp 98.2 F (36.8 C) (Oral)   Resp 20   SpO2 94%   Visual Acuity Right Eye Distance:   Left Eye Distance:   Bilateral Distance:    Right Eye Near:   Left Eye Near:    Bilateral Near:     Physical Exam   UC Treatments / Results  Labs (all labs ordered are listed, but only abnormal results are displayed) Labs Reviewed  URINE CULTURE    EKG   Radiology DG Chest Portable 1 View  Result Date: 01/17/2022 CLINICAL DATA:  Shortness of breath. EXAM: PORTABLE CHEST 1 VIEW COMPARISON:  10/19/2021. FINDINGS: The heart is borderline enlarged and the mediastinal contours are within normal limits. Lung volumes are low. No consolidation, effusion, or pneumothorax. No acute osseous abnormality. IMPRESSION: No active disease. Electronically Signed   By: Thornell Sartorius M.D.   On: 01/17/2022 03:51    Procedures Procedures (including critical care time)  Medications Ordered in UC Medications - No data to display  Initial Impression / Assessment and Plan / UC Course  I have reviewed the triage vital signs and the nursing notes.  Pertinent labs & imaging results that were available during my care of the patient were reviewed by me and considered in my medical decision making (see chart for details).     I will treat with a different antibiotic and culture the urine.  She is given contact information for urology.  She states she is already been given information on scheduling a primary care appointment with a new office.  She will try taking Zyrtec instead of Benadryl as needed and I went ahead and send in her Pepcid so that is separate from the Benadryl prescription that was done earlier Final Clinical Impressions(s) / UC Diagnoses    Final diagnoses:  Cystitis  Allergic urticaria     Discharge Instructions      Take cetirizine/Zyrtec 10 mg 1 daily as needed for itching and allergy  I sent your famotidine or Pepcid 20 mg--take 1-2 times a day.  This is an H2 blocker and  should help your allergic reaction also  Take Cipro 250 mg--1 tablet 2 times daily for 7 days     ED Prescriptions     Medication Sig Dispense Auth. Provider   ciprofloxacin (CIPRO) 500 MG tablet  (Status: Discontinued) Take 1 tablet (500 mg total) by mouth 2 (two) times daily for 7 days. 14 tablet Othel Hoogendoorn, Janace Aris, MD   famotidine (PEPCID) 20 MG tablet Take 1 tablet (20 mg total) by mouth 2 (two) times daily. 30 tablet Yaminah Clayborn, Janace Aris, MD   ciprofloxacin (CIPRO) 250 MG tablet Take 1 tablet (250 mg total) by mouth every 12 (twelve) hours. 10 tablet Marlinda Mike Janace Aris, MD      PDMP not reviewed this encounter.   Zenia Resides, MD 01/17/22 1520

## 2022-01-17 NOTE — Discharge Instructions (Signed)
Please take Benadryl and Pepcid as prescribed.  Please follow up with a primary care provider if you do not have 1 please call to make an appointment.  I have given you the information for the [facility] health and wellness office.

## 2022-01-17 NOTE — Discharge Instructions (Addendum)
Take cetirizine/Zyrtec 10 mg 1 daily as needed for itching and allergy  I sent your famotidine or Pepcid 20 mg--take 1-2 times a day.  This is an H2 blocker and should help your allergic reaction also  Take Cipro 250 mg--1 tablet 2 times daily for 7 days

## 2022-01-17 NOTE — ED Provider Triage Note (Signed)
Emergency Medicine Provider Triage Evaluation Note  Tracy Larson , a 66 y.o. female  was evaluated in triage.  Pt complains of allergic reaction that began @ 1700. Patient reports shortly after eating dinner she developed pruritic hives and subsequent dyspnea/chest tightness. Sxs persisted prompting ED visit. No alleviating/aggravating factors. Denies sensation of throat closing, nausea, vomiting, dizziness, facial swelling or syncope. Recently started dicyclomine a few days ago and ate liver for the first time while taking this- she is unsure if this was the combination, however she has had liver before without issue.   Review of Systems  Per above  Physical Exam  BP (!) 148/102   Pulse (!) 103   Temp 98 F (36.7 C) (Oral)   Resp 20   SpO2 100%  Gen:   Awake, no distress   Resp:  Normal effort, no stridor, no wheezing.  MSK:   Moves extremities without difficulty  Other:  Mildly tachycardic. No angioedema, airway is patent.  Urticaria to trunk.   Medical Decision Making  Medically screening exam initiated at 00:30  Appropriate orders placed.  Tracy Larson was informed that the remainder of the evaluation will be completed by another provider, this initial triage assessment does not replace that evaluation, and the importance of remaining in the ED until their evaluation is complete.  Benadryl, pepcid, and steroids ordered.  No angioedema or sensation of throat closing, airway is patent, sxs present for several hours, do not feel patient needs epi at this time. Discussed w/ attending.   Allergic reaction.    Cherly Anderson, [place] 01/17/22 [phone]

## 2022-01-17 NOTE — ED Provider Notes (Signed)
[facility] EMERGENCY DEPARTMENT Provider Note   CSN: [id number] Arrival date & time: 01/16/22  2350     History  Chief Complaint  Patient presents with   Allergic Reaction    Tracy Larson is a 66 y.o. female who presents with "rash all over her body" started around 5 PM with associated chest tightness.  Patient has been taking dicyclomine for over a week with significant proved in her chronic abdominal pain without reaction.  She states that she took a dose of this before dinner and then subsequently ate a liver with her dinner and began to feel itchy after that.  Denies any difficulty swallowing nausea vomiting, tongue swelling, sore throat, lightheadedness, or syncope.  Primary concern at this time is a cramping sensation in her epigastrium/lower chest that lasts for few seconds at a time.  Patient appears exceptionally anxious. I personally this patient medical records.  She is history of congenital adrenal hyperplasia and took 30 mg of Solu-Cortef prior to arrival.  Also has history of shingles in January with persistent postherpetic neuralgia on the right flank.  She is not anticoagulated.  HPI     Home Medications Prior to Admission medications   Medication Sig Start Date End Date Taking? Authorizing Provider  amoxicillin (AMOXIL) 500 MG tablet Take 1,000 mg by mouth 2 (two) times daily. Patient not taking: Reported on 11/02/2021 11/01/21   [provider]  Ascorbic Acid (VITAMIN C PO) Take 500 tablets by mouth in the morning and at bedtime.    [provider]  cephALEXin (KEFLEX) 500 MG capsule Take 1 capsule (500 mg total) by mouth 3 (three) times daily. 12/20/21   Garlon Hatchet, PA-C  clarithromycin (BIAXIN) 500 MG tablet Take 500 mg by mouth 2 (two) times daily. Patient not taking: Reported on 11/02/2021 11/01/21   [provider]  Eflornithine HCl 13.9 % cream Use on desired area twice a day 05/02/21   [name], MD   hydrocortisone (CORTEF) 10 MG tablet TAKE 3 TABLETS BY MOUTH DAILY IN AM AND 2 TABLET IN  PM 09/25/21   [name], MD  hydrocortisone sodium succinate (SOLU-CORTEF) 100 MG SOLR injection Inject 100 mg as needed for adrenal crisis 03/31/20   [name], MD  Misc Natural Products [facility] COMPLEX PO) Take 2 tablets by mouth daily. 500 mg per patient    [provider]  omeprazole (PRILOSEC) 20 MG capsule Take 20 mg by mouth 2 (two) times daily. 11/01/21   [provider]  oxyCODONE-acetaminophen (PERCOCET/ROXICET) 5-325 MG tablet Take 1 tablet by mouth every 6 (six) hours as needed for severe pain or moderate pain. 10/19/21   Mancel Bale, MD  phenazopyridine (PYRIDIUM) 95 MG tablet Take 1 tablet (95 mg total) by mouth 3 (three) times daily as needed (dysuria). 12/20/21   Garlon Hatchet, PA-C  Saline Bacteriostatic (SODIUM CHLORIDE BACTERIOSTATIC) 0.9 % injection Inject 1 mL as directed as needed. 03/31/20   [name], MD  Syringe/Needle, Disp, (SYRINGE 3CC/23GX1") 23G X 1" 3 ML MISC Use once every 2 weeks to inject testosterone 03/31/20   [name], MD      Allergies    Other    Review of Systems   Review of Systems  Respiratory:  Positive for chest tightness.   Skin:  Positive for rash.    Physical Exam Updated Vital Signs BP 132/72   Pulse 81   Temp 97.7 F (36.5 C) (Oral)   Resp 20  SpO2 100%  Physical Exam Vitals and nursing note reviewed.  Constitutional:      Appearance: She is obese. She is not ill-appearing or toxic-appearing.  HENT:     Head: Normocephalic and atraumatic.     Mouth/Throat:     Mouth: Mucous membranes are moist.     Pharynx: No oropharyngeal exudate or posterior oropharyngeal erythema.  Eyes:     General:        Right eye: No discharge.        Left eye: No discharge.     Extraocular Movements: Extraocular movements intact.     Conjunctiva/sclera: Conjunctivae normal.     Pupils: Pupils are equal, round,  and reactive to light.  Cardiovascular:     Rate and Rhythm: Normal rate and regular rhythm.     Pulses: Normal pulses.     Heart sounds: Normal heart sounds. No murmur heard. Pulmonary:     Effort: Pulmonary effort is normal. No respiratory distress.     Breath sounds: Normal breath sounds. No wheezing or rales.  Abdominal:     General: Bowel sounds are normal. There is no distension.     Palpations: Abdomen is soft.     Tenderness: There is no abdominal tenderness. There is no guarding or rebound.  Musculoskeletal:        General: No deformity.     Cervical back: Neck supple.     Right lower leg: No edema.     Left lower leg: No edema.  Skin:    General: Skin is warm and dry.     Capillary Refill: Capillary refill takes less than 2 seconds.  Neurological:     General: No focal deficit present.     Mental Status: She is alert and oriented to person, place, and time. Mental status is at baseline.  Psychiatric:        Mood and Affect: Mood normal.     ED Results / Procedures / Treatments   Labs (all labs ordered are listed, but only abnormal results are displayed) Labs Reviewed  CBC WITH DIFFERENTIAL/PLATELET  COMPREHENSIVE METABOLIC PANEL  BRAIN NATRIURETIC PEPTIDE  TROPONIN I (HIGH SENSITIVITY)    EKG None  Radiology No results found.  Procedures Procedures    Medications Ordered in ED Medications  diphenhydrAMINE (BENADRYL) injection 25 mg (25 mg Intravenous Not Given 01/17/22 0033)  fentaNYL (SUBLIMAZE) injection 50 mcg (has no administration in time range)  ondansetron (ZOFRAN) injection 4 mg (has no administration in time range)  famotidine (PEPCID) IVPB 20 mg premix (0 mg Intravenous Stopped 01/17/22 0115)  methylPREDNISolone sodium succinate (SOLU-MEDROL) 125 mg/2 mL injection 125 mg (125 mg Intravenous Given 01/17/22 0032)  albuterol (VENTOLIN HFA) 108 (90 Base) MCG/ACT inhaler 2 puff (2 puffs Inhalation Given 01/17/22 0034)    ED Course/ Medical Decision  Making/ A&P                           Medical Decision Making 66 year old with intermittent cramping chest pain and urticarial rash at home.  Tachycardic and hypertensive on intake, tachypneic.  Vital signs otherwise normal.  Administered Solu-Medrol, albuterol, and Pepcid in triage, but patient refused Benadryl.  Cardiopulmonary exam is normal, epigastric tenderness palpation on my evaluation.  Postherpetic hyperpigmentation tenderness palpation of the right flank consistent with patient's known postherpetic neuralgia.  Neurovascular intact, resting calmly in bed time my arrival to the room.  Patient's expression of her discomfort escalates significantly when the provider  is in the room, however when patient is visualized through the window, lies comfortably in her bed without apparent distress.  Doubt ACS, no dysrhythmia evident on EKG. must also consider dissection, pleural effusion, Gastritis, GERD, esophagitis.  Amount and/or Complexity of Data Reviewed Labs: ordered.    Details: CBC is unremarkable, CMP unremarkable, lipase is normal opponent is normal, BNP is is unremarkable Radiology: ordered.    Details: .  Chest x-ray negative for acute cardiopulmonary disease ECG/medicine tests:     Details: EKG had sinus tachycardia with rate of 105.  Risk OTC drugs. Prescription drug management.    Patient with significant improvement in her rash, none visible at time initial evaluation, administered Zofran and fentanyl as well as GI cocktail with Maalox with significant improvement in her tachypnea, suspect this is related to her anxiety.  Does not continue to endorse intermittent sharp upper abdominal pain.  Recommend trialing Bentyl as patient is tolerated in the past, however patient refused and is refusing to trial p.o.  Care of this patient signed out to oncoming ED provider Solon Augusta, PA-C at time of shift change. All pertinent HPI, physical exam, and laboratory findings were  discussed with them prior to my departure. Disposition of patient pending completion of workup, reevaluation, and clinical judgement of oncoming ED provider.   Tracy Larson  voiced understanding of her medical evaluation and treatment plan. Each of their questions answered to their expressed satisfaction.    This chart was dictated using voice recognition software, Dragon. Despite the best efforts of this provider to proofread and correct errors, errors may still occur which can change documentation meaning.  Final Clinical Impression(s) / ED Diagnoses Final diagnoses:  None    Rx / DC Orders ED Discharge Orders     None         Sherrilee Gilles 01/17/22 6629    Shon Baton, MD 01/22/22 0001

## 2022-01-17 NOTE — ED Triage Notes (Signed)
Pt reports since yesterday having hives and itching. Was seen at ED today where prescribed pepcid and benadryl but reports didn't get filled bc was told by surgeon not to take Benadryl.   Pt taking Doxycycline for infection.  Pt also wanting to be seen for her UTI symptoms that arent getting better with antibiotics.

## 2022-01-17 NOTE — ED Notes (Addendum)
Pt complained that it was hard for her to breathe. NT rechecked pts VS and all were within normal limits. Will move pt back to room as soon as it is ready.

## 2022-01-18 LAB — URINE CULTURE: Culture: <10000 — AB

## 2022-01-19 ENCOUNTER — Emergency Department (HOSPITAL_COMMUNITY): Payer: Medicare HMO

## 2022-01-19 ENCOUNTER — Encounter (HOSPITAL_COMMUNITY): Payer: Self-pay | Admitting: Emergency Medicine

## 2022-01-19 ENCOUNTER — Emergency Department (HOSPITAL_COMMUNITY)
Admission: EM | Admit: 2022-01-19 | Discharge: 2022-01-19 | Disposition: A | Discharge status: Home / Self Care | Payer: Medicare HMO | Attending: Emergency Medicine | Admitting: Emergency Medicine

## 2022-01-19 ENCOUNTER — Other Ambulatory Visit: Payer: Self-pay

## 2022-01-19 DIAGNOSIS — R7989 Other specified abnormal findings of blood chemistry: Secondary | ICD-10-CM | POA: Diagnosis not present

## 2022-01-19 DIAGNOSIS — R0789 Other chest pain: Secondary | ICD-10-CM | POA: Insufficient documentation

## 2022-01-19 DIAGNOSIS — R0602 Shortness of breath: Secondary | ICD-10-CM | POA: Insufficient documentation

## 2022-01-19 DIAGNOSIS — R918 Other nonspecific abnormal finding of lung field: Secondary | ICD-10-CM | POA: Diagnosis not present

## 2022-01-19 DIAGNOSIS — R791 Abnormal coagulation profile: Secondary | ICD-10-CM | POA: Insufficient documentation

## 2022-01-19 DIAGNOSIS — R079 Chest pain, unspecified: Secondary | ICD-10-CM

## 2022-01-19 DIAGNOSIS — R739 Hyperglycemia, unspecified: Secondary | ICD-10-CM | POA: Insufficient documentation

## 2022-01-19 DIAGNOSIS — L509 Urticaria, unspecified: Secondary | ICD-10-CM | POA: Diagnosis present

## 2022-01-19 DIAGNOSIS — R109 Unspecified abdominal pain: Secondary | ICD-10-CM | POA: Diagnosis not present

## 2022-01-19 DIAGNOSIS — D72829 Elevated white blood cell count, unspecified: Secondary | ICD-10-CM | POA: Insufficient documentation

## 2022-01-19 DIAGNOSIS — R000 Tachycardia, unspecified: Secondary | ICD-10-CM | POA: Insufficient documentation

## 2022-01-19 DIAGNOSIS — J9811 Atelectasis: Secondary | ICD-10-CM | POA: Diagnosis not present

## 2022-01-19 LAB — CBC WITH DIFFERENTIAL/PLATELET
Abs Immature Granulocytes: 0.03 10*3/uL (ref 0.00–0.07)
Basophils Absolute: 0 10*3/uL (ref 0.0–0.1)
Basophils Relative: 0 %
Eosinophils Absolute: 0 10*3/uL (ref 0.0–0.5)
Eosinophils Relative: 0 %
HCT: 40.7 % (ref 36.0–46.0)
Hemoglobin: 12.9 g/dL (ref 12.0–15.0)
Immature Granulocytes: 0 %
Lymphocytes Relative: 8 %
Lymphs Abs: 0.8 10*3/uL (ref 0.7–4.0)
MCH: 28 pg (ref 26.0–34.0)
MCHC: 31.7 g/dL (ref 30.0–36.0)
MCV: 88.5 fL (ref 80.0–100.0)
Monocytes Absolute: 0.3 10*3/uL (ref 0.1–1.0)
Monocytes Relative: 3 %
Neutro Abs: 9.5 10*3/uL — ABNORMAL HIGH (ref 1.7–7.7)
Neutrophils Relative %: 89 %
Platelets: 226 10*3/uL (ref 150–400)
RBC: 4.6 MIL/uL (ref 3.87–5.11)
RDW: 13.2 % (ref 11.5–15.5)
WBC: 10.6 10*3/uL — ABNORMAL HIGH (ref 4.0–10.5)
nRBC: 0 % (ref 0.0–0.2)

## 2022-01-19 LAB — COMPREHENSIVE METABOLIC PANEL
ALT: 20 U/L (ref 0–44)
AST: 35 U/L (ref 15–41)
Albumin: 3.4 g/dL — ABNORMAL LOW (ref 3.5–5.0)
Alkaline Phosphatase: 54 U/L (ref 38–126)
Anion gap: 8 (ref 5–15)
BUN: 10 mg/dL (ref 8–23)
CO2: 23 mmol/L (ref 22–32)
Calcium: 8.4 mg/dL — ABNORMAL LOW (ref 8.9–10.3)
Chloride: 107 mmol/L (ref 98–111)
Creatinine, Ser: 0.85 mg/dL (ref 0.44–1.00)
GFR, Estimated: >60 mL/min (ref >60)
Glucose, Bld: 136 mg/dL — ABNORMAL HIGH (ref 70–99)
Potassium: 4.1 mmol/L (ref 3.5–5.1)
Sodium: 138 mmol/L (ref 135–145)
Total Bilirubin: 1.1 mg/dL (ref 0.3–1.2)
Total Protein: 7 g/dL (ref 6.5–8.1)

## 2022-01-19 LAB — TROPONIN I (HIGH SENSITIVITY): Troponin I (High Sensitivity): 6 ng/L (ref <18)

## 2022-01-19 LAB — D-DIMER, QUANTITATIVE: D-Dimer, Quant: 5.81 ug/mL-FEU — ABNORMAL HIGH (ref 0.00–0.50)

## 2022-01-19 MED ORDER — FAMOTIDINE 20 MG PO TABS: 20.0000 mg | ORAL_TABLET | Freq: Once | ORAL | Status: DC

## 2022-01-19 MED ORDER — FAMOTIDINE IN NACL 20-0.9 MG/50ML-% IV SOLN
20.0000 mg | Freq: Once | INTRAVENOUS | Status: AC
  Administered 2022-01-19: 20 mg via INTRAVENOUS
  Filled 2022-01-19: qty 50

## 2022-01-19 MED ORDER — IOHEXOL 350 MG/ML SOLN
80.0000 mL | Freq: Once | INTRAVENOUS | Status: AC | PRN
  Administered 2022-01-19: 80 mL via INTRAVENOUS

## 2022-01-19 MED ORDER — SODIUM CHLORIDE 0.9 % IV BOLUS
1000.0000 mL | Freq: Once | INTRAVENOUS | Status: AC
  Administered 2022-01-19: 1000 mL via INTRAVENOUS

## 2022-01-19 MED ORDER — LORAZEPAM 1 MG PO TABS: 1.0000 mg | ORAL_TABLET | Freq: Once | ORAL | Status: DC

## 2022-01-19 MED ORDER — LORAZEPAM 2 MG/ML IJ SOLN
1.0000 mg | Freq: Once | INTRAMUSCULAR | Status: AC
  Administered 2022-01-19: 1 mg via INTRAVENOUS
  Filled 2022-01-19: qty 1

## 2022-01-19 MED ORDER — DIPHENHYDRAMINE HCL 25 MG PO CAPS: 50.0000 mg | ORAL_CAPSULE | Freq: Once | ORAL | Status: DC

## 2022-01-19 MED ORDER — DIPHENHYDRAMINE HCL 25 MG PO CAPS
25.0000 mg | ORAL_CAPSULE | Freq: Once | ORAL | Status: AC
  Administered 2022-01-19: 25 mg via ORAL
  Filled 2022-01-19: qty 1

## 2022-01-19 MED ORDER — LACTATED RINGERS IV BOLUS
1000.0000 mL | Freq: Once | INTRAVENOUS | Status: AC
  Administered 2022-01-19: 1000 mL via INTRAVENOUS

## 2022-01-19 NOTE — ED Provider Notes (Signed)
[facility] EMERGENCY DEPARTMENT Provider Note   CSN: [id number] Arrival date & time: 01/19/22  0534     History  Chief Complaint  Patient presents with   Urticaria    Tracy Larson is a 66 y.o. female.  HPI 66 year old female presents with chest pain, shortness of breath, and rash.  She has multiple complaints.  The chest pain is described as a pressure constantly, 24/7 for at least 5 or more days.  When the rash she has been experiencing over the same timeframe causes her to itch a lot, she will also get a sharp component.  She is also short of breath, especially with exertion.  The rash has been present for the last 5 days or so.  It is quite itchy and diffuse.  When she got here last night she was given medicine and states that the itchiness is a lot better.  She is currently being treated for a UTI with ciprofloxacin.  She states she has chronic UTIs and was started on this 2 days ago.  Rash started before that.  Otherwise she has cut back on a lot of her medicines as she needs to have certain medicines cut off for 4 weeks for a gastric procedure.  She has chronic abdominal pain.  She is still taking her Solu-Cortef as instructed.  She reports subjective fever as well as chills throughout this past week.  She has not taken her temperature.  Home Medications Prior to Admission medications   Medication Sig Start Date End Date Taking? Authorizing Provider  Ascorbic Acid (VITAMIN C PO) Take 500 tablets by mouth in the morning and at bedtime.    [provider]  ciprofloxacin (CIPRO) 250 MG tablet Take 1 tablet (250 mg total) by mouth every 12 (twelve) hours. 01/17/22   Zenia Resides, MD  Eflornithine HCl 13.9 % cream Use on desired area twice a day 05/02/21   [name], MD  famotidine (PEPCID) 20 MG tablet Take 1 tablet (20 mg total) by mouth 2 (two) times daily. 01/17/22   Zenia Resides, MD  hydrocortisone (CORTEF) 10 MG tablet TAKE 3  TABLETS BY MOUTH DAILY IN AM AND 2 TABLET IN  PM 09/25/21   [name], MD  hydrocortisone sodium succinate (SOLU-CORTEF) 100 MG SOLR injection Inject 100 mg as needed for adrenal crisis 03/31/20   [name], MD  Misc Natural Products [facility] COMPLEX PO) Take 2 tablets by mouth daily. 500 mg per patient    [provider]  omeprazole (PRILOSEC) 20 MG capsule Take 20 mg by mouth 2 (two) times daily. 11/01/21   [provider]  oxyCODONE-acetaminophen (PERCOCET/ROXICET) 5-325 MG tablet Take 1 tablet by mouth every 6 (six) hours as needed for severe pain or moderate pain. 10/19/21   Mancel Bale, MD  phenazopyridine (PYRIDIUM) 95 MG tablet Take 1 tablet (95 mg total) by mouth 3 (three) times daily as needed (dysuria). 12/20/21   Garlon Hatchet, PA-C  Saline Bacteriostatic (SODIUM CHLORIDE BACTERIOSTATIC) 0.9 % injection Inject 1 mL as directed as needed. 03/31/20   [name], MD  Syringe/Needle, Disp, (SYRINGE 3CC/23GX1") 23G X 1" 3 ML MISC Use once every 2 weeks to inject testosterone 03/31/20   [name], MD      Allergies    Other    Review of Systems   Review of Systems  Constitutional:  Positive for chills and fever.  Respiratory:  Positive for shortness of breath. Negative for cough.  Cardiovascular:  Positive for chest pain.  Gastrointestinal:  Positive for abdominal pain.  Skin:  Positive for rash.    Physical Exam Updated Vital Signs BP 114/72   Pulse (!) 108   Temp 98.2 F (36.8 C) (Oral)   Resp (!) 21   SpO2 100%  Physical Exam Vitals and nursing note reviewed.  Constitutional:      General: She is not in acute distress.    Appearance: She is well-developed. She is not ill-appearing or diaphoretic.  HENT:     Head: Normocephalic and atraumatic.     Mouth/Throat:     Comments: No lesions seen in oropharynx Cardiovascular:     Rate and Rhythm: Regular rhythm. Tachycardia present.     Heart sounds: Normal heart sounds.      Comments: HR low 100s Pulmonary:     Effort: Pulmonary effort is normal.     Breath sounds: Normal breath sounds.  Abdominal:     Palpations: Abdomen is soft.     Tenderness: There is abdominal tenderness.  Skin:    General: Skin is warm and dry.     Findings: Rash present.     Comments: Patient has irregularly shaped and scattered rash consistent with hives.  Most prominent on bilateral lateral thighs and posterior neck/upper back.  Not much in her anterior abdomen that she states it has receded some.  No skin sloughing.  Neurological:     Mental Status: She is alert.     ED Results / Procedures / Treatments   Labs (all labs ordered are listed, but only abnormal results are displayed) Labs Reviewed  COMPREHENSIVE METABOLIC PANEL - Abnormal; Notable for the following components:      Result Value   Glucose, Bld 136 (*)    Calcium 8.4 (*)    Albumin 3.4 (*)    All other components within normal limits  CBC WITH DIFFERENTIAL/PLATELET - Abnormal; Notable for the following components:   WBC 10.6 (*)    Neutro Abs 9.5 (*)    All other components within normal limits  D-DIMER, QUANTITATIVE - Abnormal; Notable for the following components:   D-Dimer, Quant 5.81 (*)    All other components within normal limits  TROPONIN I (HIGH SENSITIVITY)    EKG EKG Interpretation  Date/Time:  Friday January 19 2022 10:07:51 EDT Ventricular Rate:  97 PR Interval:  142 QRS Duration: 94 QT Interval:  335 QTC Calculation: 426 R Axis:   64 Text Interpretation: Sinus rhythm Abnormal R-wave progression, early transition no acute ST/T changes Confirmed by Pricilla Loveless [id number]) on 01/19/2022 10:15:35 AM  Radiology CT Angio Chest PE W and/or Wo Contrast  Result Date: 01/19/2022 CLINICAL DATA:  Pulmonary embolism (PE) suspected, positive D-dimer EXAM: CT ANGIOGRAPHY CHEST WITH CONTRAST TECHNIQUE: Multidetector CT imaging of the chest was performed using the standard protocol during bolus  administration of intravenous contrast. Multiplanar CT image reconstructions and MIPs were obtained to evaluate the vascular anatomy. RADIATION DOSE REDUCTION: This exam was performed according to the departmental dose-optimization program which includes automated exposure control, adjustment of the mA and/or kV according to patient size and/or use of iterative reconstruction technique. CONTRAST:  108mL OMNIPAQUE IOHEXOL 350 MG/ML SOLN COMPARISON:  None Available. FINDINGS: Cardiovascular: Satisfactory opacification of the pulmonary arteries to the segmental level. No evidence of pulmonary embolism. Normal heart size. No pericardial effusion. Mediastinum/Nodes: No enlarged nodes. Thoracic aorta is normal in caliber. Included thyroid is unremarkable. Esophagus is unremarkable. Lungs/Pleura: Mild right basilar atelectasis/scarring. No pleural  effusion pneumothorax. Upper Abdomen: No acute abnormality. Musculoskeletal: No acute osseous abnormality. Review of the MIP images confirms the above findings. IMPRESSION: No acute pulmonary embolism or other acute abnormality. Electronically Signed   By: Guadlupe Spanish M.D.   On: 01/19/2022 12:12   DG Chest 1 View  Result Date: 01/19/2022 CLINICAL DATA:  Hives and shortness of breath EXAM: CHEST  1 VIEW COMPARISON:  Two days ago FINDINGS: Normal heart size and mediastinal contours. No acute infiltrate or edema. No effusion or pneumothorax. No acute osseous findings. IMPRESSION: No evidence of active disease. Electronically Signed   By: Tiburcio Pea M.D.   On: 01/19/2022 07:40    Procedures Procedures    Medications Ordered in ED Medications  sodium chloride 0.9 % bolus 1,000 mL (0 mLs Intravenous Stopped 01/19/22 0641)  LORazepam (ATIVAN) injection 1 mg (1 mg Intravenous Given 01/19/22 1517)  famotidine (PEPCID) IVPB 20 mg premix (0 mg Intravenous Stopped 01/19/22 0641)  lactated ringers bolus 1,000 mL (0 mLs Intravenous Stopped 01/19/22 1226)  iohexol (OMNIPAQUE)  350 MG/ML injection 80 mL (80 mLs Intravenous Contrast Given 01/19/22 1155)  diphenhydrAMINE (BENADRYL) capsule 25 mg (25 mg Oral Given 01/19/22 1237)    ED Course/ Medical Decision Making/ A&P                           Medical Decision Making Amount and/or Complexity of Data Reviewed External Data Reviewed: notes. Labs: ordered.    Details: Unremarkable electrolytes save for mild hypocalcemia and mild hyperglycemia.  Troponin normal.  Slight leukocytosis, elevated D-dimer Radiology: ordered and independent interpretation performed.    Details: CT angio without acute pulmonary embolus ECG/medicine tests: ordered and independent interpretation performed.    Details: Initial ECG with significant tachycardia but repeat shows no acute ST changes and normal heart rate  Risk Prescription drug management.   Unclear cause of the patient's urticaria at this point.  She was recently put on Cipro which I have advised her to stop though this was put on after she had already developed the symptoms.  She was given Keflex over a week ago but I am not sure if this would be the cause either as she is not taking it.  Could be a vasculitis issue or endocrine issue.  Not sure if it is truly related to her chest pain or shortness of breath.  She is afebrile here.  Work-up was obtained given her significant tachycardia on arrival though based on triage report it seems like this is more of an anxiety issue.  Work-up for PE as above and is negative.  With more than 24 hours of continuous chest pain symptoms and negative troponin, I doubt ACS.  She will be given Benadryl here as she starting to have more itching just prior to discharge which accounts for her tachycardia as otherwise her heart rate had normalized.  From a rash perspective, I would normally consider putting her on steroids but she is already on hydrocortisone chronically and I think she will need to discuss with her endocrinologist if going up would be  okay.  At this point, I think she is stable for discharge home and will refer to dermatology.  Should also follow-up with PCP.        Final Clinical Impression(s) / ED Diagnoses Final diagnoses:  Urticaria  Nonspecific chest pain    Rx / DC Orders ED Discharge Orders     None  Pricilla Loveless, MD 01/19/22 1247

## 2022-01-19 NOTE — ED Triage Notes (Signed)
Pt reported to ED with c/o hives and shortness of breath. States hives have been present for close to one week but shortness of breath is new. Pt states she also feels swelling to lips.

## 2022-01-19 NOTE — Discharge Instructions (Signed)
If you develop recurrent, continued, or worsening chest pain, shortness of breath, fever, vomiting, abdominal or back pain, worsening rash, or any other new/concerning symptoms then return to the ER for evaluation.

## 2022-01-19 NOTE — ED Provider Triage Note (Signed)
  Emergency Medicine Provider Triage Evaluation Note  MRN:  [id number]  Arrival date & time: 01/19/22    Medically screening exam initiated at 5:56 AM.   CC:   Urticaria   HPI:  Tracy Larson is a 66 y.o. year-old female presents to the ED with chief complaint of hives.  States it's been ongoing for several days.  She states she feels very anxious.  She reports new SOB, but seems to be hyperventilating.  History provided by patient. ROS:  -As included in HPI PE:   Vitals:   01/19/22 0540  BP: 117/75  Pulse: (!) 140  Resp: (!) 22  Temp: 98 F (36.7 C)  SpO2: 95%    Very anxious appearing Tachypnea, but no wheezing Diffuse hives MDM:  Patient here with hives.  Very anxious.  Tachycardic, but not hypotensive.  Seems more consistent with panic attack.  Will give fluids and meds.  Patient was informed that the remainder of the evaluation will be completed by another provider, this initial triage assessment does not replace that evaluation, and the importance of remaining in the ED until their evaluation is complete.    Roxy Horseman, PA-C 01/19/22 0600

## 2022-01-19 NOTE — ED Notes (Signed)
Called pt x3 for room, checked triage. No response.

## 2022-01-25 ENCOUNTER — Other Ambulatory Visit: Payer: Self-pay | Admitting: Urology

## 2022-01-25 DIAGNOSIS — N390 Urinary tract infection, site not specified: Secondary | ICD-10-CM | POA: Diagnosis not present

## 2022-01-25 DIAGNOSIS — R351 Nocturia: Secondary | ICD-10-CM | POA: Diagnosis not present

## 2022-01-25 DIAGNOSIS — R3121 Asymptomatic microscopic hematuria: Secondary | ICD-10-CM | POA: Diagnosis not present

## 2022-01-25 DIAGNOSIS — R350 Frequency of micturition: Secondary | ICD-10-CM | POA: Diagnosis not present

## 2022-01-31 ENCOUNTER — Ambulatory Visit (INDEPENDENT_AMBULATORY_CARE_PROVIDER_SITE_OTHER): Payer: Medicare HMO | Admitting: Internal Medicine

## 2022-01-31 ENCOUNTER — Encounter: Payer: Self-pay | Admitting: Internal Medicine

## 2022-01-31 VITALS — BP 128/74 | HR 69 | Ht 62.0 in | Wt 201.0 lb

## 2022-01-31 DIAGNOSIS — E559 Vitamin D deficiency, unspecified: Secondary | ICD-10-CM | POA: Diagnosis not present

## 2022-01-31 DIAGNOSIS — Z7952 Long term (current) use of systemic steroids: Secondary | ICD-10-CM

## 2022-01-31 DIAGNOSIS — E250 Congenital adrenogenital disorders associated with enzyme deficiency: Secondary | ICD-10-CM

## 2022-01-31 DIAGNOSIS — E2740 Unspecified adrenocortical insufficiency: Secondary | ICD-10-CM | POA: Diagnosis not present

## 2022-01-31 MED ORDER — HYDROCORTISONE 10 MG PO TABS: ORAL_TABLET | ORAL | 3 refills | Status: DC

## 2022-01-31 MED ORDER — HYDROCORTISONE SOD SUC (PF) 100 MG IJ SOLR: INTRAMUSCULAR | 99 refills | Status: DC

## 2022-01-31 NOTE — Progress Notes (Signed)
Patient ID: Tracy Larson, female   DOB: 06/03/1956, 66 y.o.   MRN: [id number]  HPI  Tracy Larson is a 66 y.o.-year-old female, initially referred by her PCP, Dr. Conley Rolls, returning for follow-up for congenital adrenal hyperplasia (most likely non-classical form), adrenal insufficiency, and vitamin D deficiency. She has been followed by Dr. Sharl Ma, but had to switch dr's b/c outstanding bill with his office. Last visit with me 6 months ago. PCP: [facility]) - will need a new PCP.  Interim history: Since last visit, she was in the emergency room several times: Postherpetic neuralgia, cystitis, hives.  She was not given prednisone at her ED visit with hives due to her adrenal insufficiency and pending her getting in touch with me about this.   She continues to have back pain and stomach spasms.  Because of this, she stopped her vitamin D supplement. She is currently on hydrocortisone 30 mg in am and 10 mg in 3 more doses (total 60 mg a day), dose increased from 40 mg daily 2/2 postherpetic neuralgia since last visit. She had sq Solucortef once since last OV. She lost 20 lbs since last in-person visit. No salt craving, dizziness, leg swelling.  Reviewed history: Pt. has been dx with adrenal insufficiency at birth and then 66 y/o - became pregnant (she had 4 children -no history of infertility).   She did not have to have vaginoplasty.  She tried fludrocortisone (but developed edema and hypertension and had to stop).  She has h/o adrenal crises:  1985  2005 - 2/2 flu [facility]).  She did not have genetic counseling at dx.  She is on replacement with hydrocortisone previously on high dose, up to 100 mg daily for 15 years when her children were growing up, then decreased to 50 mg daily for another 10 years, 40 mg daily for 16 years.  We decreased the dose and she tried several lower doses, currently on 20 mg in a.m. (at 6 am, then goes back to bed), then wakes up definitively  at 12 pm >> changed to 20 mg in am and 10 >> 15 mg in the pm - changed it in 06/2019.  She is aware she needs to increase the dose of glucocorticoid if she has fever and needs to get injectable steroids if she cannot take p.o.   No diabetes, hypertension, fractures.  She does have hirsutism on chin but no acne.  We tried to start eflornithine cream but this was not covered.  Previous bone density scores were normal: 07/22/17 ([facility]) Lumbar spine L1-L4 Femoral neck (FN) 33% distal radius  T-score 1.6 RFN: 0.4 LFN: 0.3 n/a   [facility], 2013 Lumbar spine (L1-L3) Femoral neck (FN) 33% distal radius  T-score + 0.5 RFN:- 0.1 LFN:+ 0.5 n/a   Reviewed pertinent labs: Her labs initially normalized (except for the 17 hydroxy progesterone) after she decreased the dose of hydrocortisone.  However, her testosterone started to increase afterwards.  At last visit, her ACTH was also high.  I checked with her and she was not getting testosterone transfer.  Component     Latest Ref Rng & Units 03/31/2020  Testosterone, Serum (Total)     ng/dL 83 (H)  % Free Testosterone     % 1.0  Free Testosterone, S     pg/mL 8.3 (H)  Sex Hormone Binding Globulin     nmol/L 47.6  DHEA-Sulfate, LCMS     ug/dL 36  12-IN-OMVEHMCNOBSJ, LC/MS/MS  ng/dL 6,0457,435  [id number] ACTH     6 - 50 pg/mL 297 (H)   Component     Latest Ref Rng & Units 08/03/2019  Testosterone, Serum (Total)     ng/dL 83 (H)  % Free Testosterone     % 1.4  Free Testosterone, S     pg/mL 12 (H)  Sex Hormone Binding Globulin     nmol/L 42.2  C206 ACTH     6 - 50 pg/mL 261 (H)  17-OH-Progesterone, LC/MS/MS     * ng/dL 1,1915,790  DHEA-Sulfate, LCMS     ug/dL 17   Component     Latest Ref Rng & Units 07/17/2018  Testosterone, Serum (Total)     ng/dL 61 (H)  % Free Testosterone     % 1.2  Free Testosterone, S     pg/mL 7.3 (H)  Sex Hormone Binding Globulin     nmol/L 53.7  17-OH-Progesterone, LC/MS/MS     * ng/dL 4,7821,342  [id number]  ACTH     6 - 50 pg/mL 32  DHEA-Sulfate, LCMS     ug/dL 11   Component     Latest Ref Rng & Units 07/17/2017  Testosterone, Serum (Total)     ng/dL 49  % Free Testosterone     % 1.1  Free Testosterone, S     pg/mL 5.4  Sex Hormone Binding Globulin     nmol/L 54.8  DHEA-Sulfate, LCMS     ug/dL 14  13-YQ-MVHQIONGEXBM17-OH-Progesterone, LC/MS/MS     ng/dL 841263  L244C206 ACTH     6 - 50 pg/mL 9   Component     Latest Ref Rng & Units 02/07/2016  Testosterone     3 - 41 ng/dL 37  Sex Horm Binding Glob, Serum     17.3 - 125.0 nmol/L 46.7  Testosterone Free     0.0 - 4.2 pg/mL 1.8  ALDOSTERONE     0.0 - 30.0 ng/dL 1.0  Renin     0.1020.167 - 5.380 ng/mL/hr 1.048  ALDOS/RENIN RATIO     0.0 - 30.0 1.0  Potassium     3.5 - 5.1 mEq/L 3.9  DHEA-SO4     29.4 - 220.5 ug/dL 72.526.4 (L)  36-UYQIHKVQQVZDGLOVFIE17-Hydroxyprogesterone     ng/dL 332374  ACTH     7.2 - 95.163.3 pg/mL 9.2   Component     Latest Ref Rng & Units 08/06/2014 02/07/2015  Testosterone     10 - 70 ng/dL 66   Sex Horm Binding Glob, Serum     14 - 73 nmol/L 25   Testosterone Free     0.6 - 6.8 pg/mL 13.9 (H)   Testosterone-% Free     0.4 - 2.4 % 2.1   PRA LC/MS/MS     0.25 - 5.82 ng/mL/h 1.67 5.73  ALDO / PRA Ratio     0.9 - 28.9 Ratio 1.8 1.6  ALDOSTERONE     ng/dL 3 9  DHEA     884102 - 1,6601,185 ng/dL 27 (L)   Androstenedione     ng/dL 10   63-KZ-SWFUXNATFTDD17-OH-Progesterone, LC/MS/MS     ng/dL 2,2022,014 (H) 5,4271,574 (H)  C623C206 ACTH     6 - 50 pg/mL 78 (H) 57 (H)  Potassium     3.5 - 5.1 mEq/L  3.9  DHEA-SO4     8 - 188 ug/dL  26   76/28/315103/26/2014: - 17 hydroxyprogesterone 6180 - Hemoglobin A1c 5.4% - Lipids: 176/94/40/126 - TSH 1.94 - LFTs normal -  ACTH 201.7 - DHEAS 42.5 - Aldosterone <1, PRA 1.37  Previous records from Dr. Sharl Ma: - Patient was previously on fludrocortisone, however, this rendered her hypertensive and with edema >>  fludrocortisone was eventually stopped. Dr. Sharl Ma mentioned that she is on a high dose of steroids, and probably her many years of over replacement  with steroids have caused her secondary adrenal insufficiency.   Vitamin D deficiency  -Previously on ergocalciferol, then 1 cholecalciferol 12,000 units daily (but she felt this was causing lethargy...) >> at last visit she was on 8000 IU >> we increased to 10,000 units daily.  At last visit we continued the same dose but I advised her to add K2 vitamin D has the vitamin D absorption.  At last visit she was on Osteo Bi-Flex with 2000 units vitamin D daily.  We continued this - now off 2/2 stomach.  Reviewed vitamin D levels: Lab Results  Component Value Date   VD25OH 27.34 (L) 03/31/2020   VD25OH 23.86 (L) 08/03/2019   VD25OH 34.12 07/17/2018   VD25OH 19.27 (L) 07/17/2017   VD25OH 16.22 (L) 02/07/2016   VD25OH 11.32 (L) 02/07/2015   Before the coronavirus pandemic she was going to the gym consistently, doing Silver sneakers.   She has a history of iritis. She was on 50 mg Hydrocortisone for the month of 06/2018 b/c of this, then decreased to 30 mg daily.  ROS: + see HPI  I reviewed pt's medications, allergies, PMH, social hx, family hx, and changes were documented in the history of present illness. Otherwise, unchanged from my initial visit note.   Past Medical History:  Diagnosis Date   Adrenal hyperplasia (HCC)    Multiple gastric ulcers    Shingles    "internal"   Past surgical history: - C sections  History   Social History   Marital Status: Married    Spouse Name: [name]    Number of Children: 4   Occupational History    nurse tech    Social History Main Topics   Smoking status: Never Smoker    Smokeless tobacco: Not on file   Alcohol Use: No   Drug Use: No   Current Outpatient Medications on File Prior to Visit  Medication Sig Dispense Refill   Ascorbic Acid (VITAMIN C PO) Take 500 tablets by mouth in the morning and at bedtime.     ciprofloxacin (CIPRO) 250 MG tablet Take 1 tablet (250 mg total) by mouth every 12 (twelve) hours. 10 tablet 0   Eflornithine  HCl 13.9 % cream Use on desired area twice a day 45 g 1   famotidine (PEPCID) 20 MG tablet Take 1 tablet (20 mg total) by mouth 2 (two) times daily. 30 tablet 0   hydrocortisone (CORTEF) 10 MG tablet TAKE 3 TABLETS BY MOUTH DAILY IN AM AND 2 TABLET IN  PM 360 tablet 1   hydrocortisone sodium succinate (SOLU-CORTEF) 100 MG SOLR injection Inject 100 mg as needed for adrenal crisis 3 each prn   Misc Natural Products (GINSENG COMPLEX PO) Take 2 tablets by mouth daily. 500 mg per patient     omeprazole (PRILOSEC) 20 MG capsule Take 20 mg by mouth 2 (two) times daily.     oxyCODONE-acetaminophen (PERCOCET/ROXICET) 5-325 MG tablet Take 1 tablet by mouth every 6 (six) hours as needed for severe pain or moderate pain. 15 tablet 0   phenazopyridine (PYRIDIUM) 95 MG tablet Take 1 tablet (95 mg total) by mouth 3 (three) times daily as needed (  dysuria). 9 tablet 0   Saline Bacteriostatic (SODIUM CHLORIDE BACTERIOSTATIC) 0.9 % injection Inject 1 mL as directed as needed. 10 mL 3   Syringe/Needle, Disp, (SYRINGE 3CC/23GX1") 23G X 1" 3 ML MISC Use once every 2 weeks to inject testosterone 50 each 1   No current facility-administered medications on file prior to visit.   Allergies  Allergen Reactions   Other Anaphylaxis    Iodine    No family history of diabetes, hypertension, hyperlipidemia, heart disease, cancer.   PE: BP 128/74 (BP Location: Right Arm, Patient Position: Sitting, Cuff Size: Normal)   Pulse 69   Ht 5\' 2"  (1.575 m)   Wt 201 lb (91.2 kg)   SpO2 98%   BMI 36.76 kg/m    Wt Readings from Last 3 Encounters:  01/31/22 201 lb (91.2 kg)  12/19/21 200 lb (90.7 kg)  11/02/21 209 lb 9.6 oz (95.1 kg)   Constitutional: overweight, in NAD Eyes: no exophthalmos ENT: moist mucous membranes, no masses palpated in neck, no cervical lymphadenopathy Cardiovascular: RRR, No MRG Respiratory: CTA B Musculoskeletal: no deformities Skin: moist, warm, no rashes Neurological: no tremor with  outstretched hands  ASSESSMENT: 1. Nonclassical CAH -In this condition, the adrenal glands cannot produce enough cortisol and the hormone production is redirected towards androgen production via increased ACTH. Therefore, we tx this with glucocorticoids not only to treat the adrenal insufficiency, but also to decrease the ACTH and, therefore, adrenal androgens. OCPs also help in reducing the androgens.   2. Adrenal insufficiency - Associated with her CAH   3. Vitamin D deficiency  PLAN:  1. And 2.   -Patient has a lifelong history of CAH, most likely nonclassical, but with adrenal insufficiency.  She did not have to have vaginoplasty and she was successful getting pregnant with 4 children.  She does not have sodium and potassium abnormalities and no hypotension.  No salt cravings and dizziness. She had an absence seizure in the past but this did not appear to be related to adrenal insufficiency as she was taking plenty of hydrocortisone at that time.  She was previously also on fludrocortisone in the past, but this was stopped due to developing hypertension and edema. -Unfortunately, she was on hydrocortisone doses higher than recommended and it is difficult to decrease the doses after getting used to these high doses.  She does not feel well at physiological doses of hydrocortisone. -She was previously on 20 mg of hydrocortisone twice a day and we discussed at previous visit and again today about the need to stay with the lowest doses possible to avoid overtreatment.  She may have decreased absorption of hydrocortisone or a degree of resistance to it.  At last visit, she was taking a large dose: 80 mg daily.  I advised her to try to decrease the doses to 20 mg in a.m. and 15 mg in p.m. she started to decrease it and got to 40 mg a day, but she increased it since then to 60 mg daily. -She recently presented to the emergency room several times, with postherpetic neuralgia in 09/2021, cystitis in  11/2021, and also hives in 12/2021.  At that time, she was not given prednisone due to her history of adrenal insufficiency.  I did discuss with her at last visit and again today that if the prescribing physician believes that she needs prednisone, she absolutely needs to get it (without checking with me or having me prescribe it) and when she finishes the course, to go  back to her previous hydrocortisone regimen.   -She had previous higher ACTH and testosterone.  We tried to start eflornithine cream but this was not covered by her insurance.  She is not on spironolactone.  She was not interested in treatment for the high testosterone since this was mild and she was not bothered by hirsutism.  Of note, DHEA-S level was normal.  17 hydroxyprogesterone checked at our last in person visit was not suppressed to indicate over replacement with hydrocortisone. -She had normal bone density T-scores in the past.  She needs another DXA scan -ordered today. -At today's visit, I again advised her to try to reduce the hydrocortisone.  -Of note, she lost 20 pounds since our last in person visit -Otherwise, since there were no changes in her steroid regimen, no adrenal labs are needed - I suggested to: Patient Instructions  Please try to reduce the steroid dose slowly (by 10 mg every 4 weeks) to: - Hydrocortisone 20 mg in a.m. and 10 mg in p.m.  - You absolutely need to take this medication every day and not skip doses. - Please double the dose if you have a fever, for the duration of the fever. - If you cannot take anything by mouth (vomiting) or you have severe diarrhea so that you eliminate the hydrocortisone pills in your stool, please make sure that you get the hydrocortisone in the vein instead - go to the nearest emergency department/urgent care or you may go to your PCPs office   Please call and schedule bone density scan at the [facility] Office: [phone].   After the stomach pain improves, try to  start 2000 units vitamin D daily.  Let's repeat the vitamin D in 2-3 months after you start.  Please come back for a follow-up appointment in 1 year.  3. Vitamin D deficiency -Previously on Osteo Bi-Flex with 2000 units vitamin D daily -but stopped due to stomach pain.  I advised her to restart her vitamin D supplement as soon as her stomach comes down. -The latest vitamin D available for review was still mildly low: Lab Results  Component Value Date   VD25OH 27.34 (L) 03/31/2020  -We will recheck a vitamin D level in 2 to 3 months after she restart the vitamin D  Orders Placed This Encounter  Procedures   DG Bone Density   Vitamin D, 25-hydroxy   [name], MD PhD [facility]

## 2022-01-31 NOTE — Patient Instructions (Addendum)
Please try to reduce the steroid dose slowly (by 10 mg every 4 weeks) to: - Hydrocortisone 20 mg in a.m. and 10 mg in p.m.  - You absolutely need to take this medication every day and not skip doses. - Please double the dose if you have a fever, for the duration of the fever. - If you cannot take anything by mouth (vomiting) or you have severe diarrhea so that you eliminate the hydrocortisone pills in your stool, please make sure that you get the hydrocortisone in the vein instead - go to the nearest emergency department/urgent care or you may go to your PCPs office   Please call and schedule bone density scan at the [facility] Office: [phone].   After the stomach pain improves, try to start 2000 units vitamin D daily.  Let's repeat the vitamin D in 2-3 months after you start.  Please come back for a follow-up appointment in 1 year.

## 2022-02-09 ENCOUNTER — Ambulatory Visit
Admission: RE | Admit: 2022-02-09 | Discharge: 2022-02-09 | Disposition: A | Discharge status: Home / Self Care | Payer: Medicare HMO | Source: Ambulatory Visit | Attending: Urology | Admitting: Urology

## 2022-02-09 DIAGNOSIS — R3121 Asymptomatic microscopic hematuria: Secondary | ICD-10-CM

## 2022-02-09 DIAGNOSIS — R300 Dysuria: Secondary | ICD-10-CM | POA: Diagnosis not present

## 2022-02-09 MED ORDER — GADOBENATE DIMEGLUMINE 529 MG/ML IV SOLN
19.0000 mL | Freq: Once | INTRAVENOUS | Status: AC | PRN
  Administered 2022-02-09: 19 mL via INTRAVENOUS

## 2022-02-22 DIAGNOSIS — R1084 Generalized abdominal pain: Secondary | ICD-10-CM | POA: Diagnosis not present

## 2022-02-22 DIAGNOSIS — A048 Other specified bacterial intestinal infections: Secondary | ICD-10-CM | POA: Diagnosis not present

## 2022-03-06 DIAGNOSIS — B0222 Postherpetic trigeminal neuralgia: Secondary | ICD-10-CM | POA: Diagnosis not present

## 2022-03-06 DIAGNOSIS — L810 Postinflammatory hyperpigmentation: Secondary | ICD-10-CM | POA: Diagnosis not present

## 2022-03-06 DIAGNOSIS — B0229 Other postherpetic nervous system involvement: Secondary | ICD-10-CM | POA: Diagnosis not present

## 2022-03-22 DIAGNOSIS — Z6835 Body mass index (BMI) 35.0-35.9, adult: Secondary | ICD-10-CM | POA: Diagnosis not present

## 2022-03-22 DIAGNOSIS — G8929 Other chronic pain: Secondary | ICD-10-CM | POA: Diagnosis not present

## 2022-03-22 DIAGNOSIS — Z008 Encounter for other general examination: Secondary | ICD-10-CM | POA: Diagnosis not present

## 2022-03-22 DIAGNOSIS — E669 Obesity, unspecified: Secondary | ICD-10-CM | POA: Diagnosis not present

## 2022-03-22 DIAGNOSIS — E2740 Unspecified adrenocortical insufficiency: Secondary | ICD-10-CM | POA: Diagnosis not present

## 2022-03-22 DIAGNOSIS — Z91013 Allergy to seafood: Secondary | ICD-10-CM | POA: Diagnosis not present

## 2022-03-22 DIAGNOSIS — I10 Essential (primary) hypertension: Secondary | ICD-10-CM | POA: Diagnosis not present

## 2022-03-22 DIAGNOSIS — Z7952 Long term (current) use of systemic steroids: Secondary | ICD-10-CM | POA: Diagnosis not present

## 2022-04-19 ENCOUNTER — Ambulatory Visit (INDEPENDENT_AMBULATORY_CARE_PROVIDER_SITE_OTHER): Payer: Medicare HMO | Admitting: Nurse Practitioner

## 2022-04-19 VITALS — BP 118/72 | HR 85 | Temp 98.1°F | Ht 62.0 in | Wt 195.5 lb

## 2022-04-19 DIAGNOSIS — R1031 Right lower quadrant pain: Secondary | ICD-10-CM | POA: Diagnosis not present

## 2022-04-19 DIAGNOSIS — Z6835 Body mass index (BMI) 35.0-35.9, adult: Secondary | ICD-10-CM

## 2022-04-19 DIAGNOSIS — E66812 Obesity, class 2: Secondary | ICD-10-CM | POA: Insufficient documentation

## 2022-04-19 DIAGNOSIS — E669 Obesity, unspecified: Secondary | ICD-10-CM | POA: Diagnosis not present

## 2022-04-19 DIAGNOSIS — G8929 Other chronic pain: Secondary | ICD-10-CM | POA: Diagnosis not present

## 2022-04-19 LAB — COMPREHENSIVE METABOLIC PANEL
ALT: 19 U/L (ref 0–35)
AST: 23 U/L (ref 0–37)
Albumin: 4.1 g/dL (ref 3.5–5.2)
Alkaline Phosphatase: 63 U/L (ref 39–117)
BUN: 11 mg/dL (ref 6–23)
CO2: 26 mEq/L (ref 19–32)
Calcium: 9.5 mg/dL (ref 8.4–10.5)
Chloride: 105 mEq/L (ref 96–112)
Creatinine, Ser: 0.71 mg/dL (ref 0.40–1.20)
GFR: 89.17 mL/min (ref >60.00)
Glucose, Bld: 97 mg/dL (ref 70–99)
Potassium: 3.8 mEq/L (ref 3.5–5.1)
Sodium: 139 mEq/L (ref 135–145)
Total Bilirubin: 0.7 mg/dL (ref 0.2–1.2)
Total Protein: 7.9 g/dL (ref 6.0–8.3)

## 2022-04-19 LAB — LIPID PANEL
Cholesterol: 194 mg/dL (ref 0–200)
HDL: 57.3 mg/dL (ref >39.00)
LDL Cholesterol: 111 mg/dL — ABNORMAL HIGH (ref 0–99)
NonHDL: 136.8
Total CHOL/HDL Ratio: 3
Triglycerides: 128 mg/dL (ref 0.0–149.0)
VLDL: 25.6 mg/dL (ref 0.0–40.0)

## 2022-04-19 LAB — CBC
HCT: 43.7 % (ref 36.0–46.0)
Hemoglobin: 13.9 g/dL (ref 12.0–15.0)
MCHC: 31.9 g/dL (ref 30.0–36.0)
MCV: 85.8 fl (ref 78.0–100.0)
Platelets: 255 10*3/uL (ref 150.0–400.0)
RBC: 5.1 Mil/uL (ref 3.87–5.11)
RDW: 13.6 % (ref 11.5–15.5)
WBC: 8.5 10*3/uL (ref 4.0–10.5)

## 2022-04-19 LAB — TSH: TSH: 1.8 u[IU]/mL (ref 0.35–5.50)

## 2022-04-19 LAB — HEMOGLOBIN A1C: Hgb A1c MFr Bld: 5.4 % (ref 4.6–6.5)

## 2022-04-19 NOTE — Assessment & Plan Note (Signed)
Etiology unclear, will review previous medical records.  Wonder if this could be from ovarian cysts versus postherpetic neuralgia?  Further recommendations may be made after medical record review.  Will refer to OB/GYN today however for further assistance with evaluation.

## 2022-04-19 NOTE — Progress Notes (Signed)
New Patient Office Visit  Subjective    Patient ID: Tracy Larson, female    DOB: 08-21-1955  Age: 66 y.o. MRN: [id number]  CC:  Chief Complaint  Patient presents with   Abdominal Pain    HPI Tracy Larson presents to establish care She was recently discharged from her previous primary care provider because they were unable to determine etiology of her abdominal pain.  She reports she has had right lower quadrant pain since 2020 after experiencing a fall.  At that time she had x-ray of hips and legs and reports no fracture was noted.  She then started experiencing excruciating pain and abdominal distention.  She reports at 1 point she looked like she was pregnant and experienced a bursting in her abdomen which resulted in distention rapidly improving.  This occurred in December 2022, and she was told she may have had an ectopic pregnancy.  She was referred to OB/GYN and was told that she never did have a pregnancy.  She reports in January 2023 she broke out in her rash on her right lower abdomen and was diagnosed with shingles.  This rash has now healed.  She was treated with gabapentin and valacyclovir.  Today, she reports that she continues to have right lower quadrant abdominal pain that she describes as contractions.  They occur daily and nightly.  They are intermittent, they will occur acutely but then will resolve spontaneously on their own.  She cannot identify any triggers or alleviating factors.  She also has adrenal insufficiency and takes hydrocortisone for this.  She follows with endocrinology (Dr. Cassandria Anger).  Outpatient Encounter Medications as of 04/19/2022  Medication Sig   hydrocortisone (CORTEF) 10 MG tablet TAKE 3 TABLETS BY MOUTH DAILY IN AM AND 3 TABLET IN  PM   hydrocortisone sodium succinate (SOLU-CORTEF) 100 MG injection Inject 100 mg as needed for adrenal crisis   Misc Natural Products (GINSENG COMPLEX PO) Take 2 tablets by mouth daily. 500 mg per patient    [DISCONTINUED] Ascorbic Acid (VITAMIN C PO) Take 500 tablets by mouth in the morning and at bedtime.   [DISCONTINUED] ciprofloxacin (CIPRO) 250 MG tablet Take 1 tablet (250 mg total) by mouth every 12 (twelve) hours.   [DISCONTINUED] Eflornithine HCl 13.9 % cream Use on desired area twice a day   [DISCONTINUED] famotidine (PEPCID) 20 MG tablet Take 1 tablet (20 mg total) by mouth 2 (two) times daily.   [DISCONTINUED] omeprazole (PRILOSEC) 20 MG capsule Take 20 mg by mouth 2 (two) times daily.   [DISCONTINUED] oxyCODONE-acetaminophen (PERCOCET/ROXICET) 5-325 MG tablet Take 1 tablet by mouth every 6 (six) hours as needed for severe pain or moderate pain.   [DISCONTINUED] phenazopyridine (PYRIDIUM) 95 MG tablet Take 1 tablet (95 mg total) by mouth 3 (three) times daily as needed (dysuria).   [DISCONTINUED] Saline Bacteriostatic (SODIUM CHLORIDE BACTERIOSTATIC) 0.9 % injection Inject 1 mL as directed as needed.   [DISCONTINUED] Syringe/Needle, Disp, (SYRINGE 3CC/23GX1") 23G X 1" 3 ML MISC Use once every 2 weeks to inject testosterone   No facility-administered encounter medications on file as of 04/19/2022.    Past Medical History:  Diagnosis Date   Adrenal hyperplasia (HCC)    Multiple gastric ulcers    Shingles    "internal"    Past Surgical History:  Procedure Laterality Date   CESAREAN SECTION      Family History  Problem Relation Age of Onset   Breast cancer Neg Hx     Social History  Socioeconomic History   Marital status: Divorced    Spouse name: Not on file   Number of children: 4   Years of education: Not on file   Highest education level: Some college, no degree  Occupational History   Not on file  Tobacco Use   Smoking status: Never   Smokeless tobacco: Never  Substance and Sexual Activity   Alcohol use: No   Drug use: No   Sexual activity: Not on file  Other Topics Concern   Not on file  Social History Narrative   Lives alone   Social Determinants of  Health   Financial Resource Strain: Not on file  Food Insecurity: Not on file  Transportation Needs: Not on file  Physical Activity: Not on file  Stress: Not on file  Social Connections: Not on file  Intimate Partner Violence: Not on file    ROS: See HPI      Objective    BP 118/72   Pulse 85   Temp 98.1 F (36.7 C) (Oral)   Ht 5\' 2"  (1.575 m)   Wt 195 lb 8 oz (88.7 kg)   SpO2 98%   BMI 35.76 kg/m   Physical Exam Vitals reviewed.  Constitutional:      General: She is not in acute distress.    Appearance: Normal appearance.  HENT:     Head: Normocephalic and atraumatic.  Neck:     Vascular: No carotid bruit.  Cardiovascular:     Rate and Rhythm: Normal rate and regular rhythm.     Pulses: Normal pulses.     Heart sounds: Normal heart sounds.  Pulmonary:     Effort: Pulmonary effort is normal.     Breath sounds: Normal breath sounds.  Abdominal:     General: Abdomen is flat. Bowel sounds are normal. There is no distension.     Palpations: Abdomen is soft. There is no mass.     Tenderness: There is abdominal tenderness in the right lower quadrant.       Comments: Redline indicates area of hyperpigmentation, this is at the site of her shingles rash that she had earlier this year.  Skin:    General: Skin is warm and dry.  Neurological:     General: No focal deficit present.     Mental Status: She is alert and oriented to person, place, and time.  Psychiatric:        Mood and Affect: Mood normal.        Behavior: Behavior normal.        Judgment: Judgment normal.         Assessment & Plan:   Problem List Items Addressed This Visit       Other   Chronic RLQ pain - Primary    Etiology unclear, will review previous medical records.  Wonder if this could be from ovarian cysts versus postherpetic neuralgia?  Further recommendations may be made after medical record review.  Will refer to OB/GYN today however for further assistance with evaluation.       Relevant Orders   Ambulatory referral to Obstetrics / Gynecology   Class 2 obesity without serious comorbidity with body mass index (BMI) of 35.0 to 35.9 in adult    Labs ordered today, for recommendations may be made based upon the results.      Relevant Orders   TSH   Hemoglobin A1c   Lipid panel   Comprehensive metabolic panel   CBC    Return in  about 1 month (around 05/20/2022) for f/u with Jermone Geister.   Elenore Paddy, NP

## 2022-04-19 NOTE — Assessment & Plan Note (Signed)
Labs ordered today, for recommendations may be made based upon the results.

## 2022-05-17 ENCOUNTER — Encounter: Payer: Self-pay | Admitting: Nurse Practitioner

## 2022-05-17 DIAGNOSIS — K2270 Barrett's esophagus without dysplasia: Secondary | ICD-10-CM | POA: Insufficient documentation

## 2022-06-06 ENCOUNTER — Emergency Department (HOSPITAL_BASED_OUTPATIENT_CLINIC_OR_DEPARTMENT_OTHER): Payer: Medicare HMO

## 2022-06-06 ENCOUNTER — Emergency Department (HOSPITAL_BASED_OUTPATIENT_CLINIC_OR_DEPARTMENT_OTHER): Payer: Medicare HMO | Admitting: Radiology

## 2022-06-06 ENCOUNTER — Emergency Department (HOSPITAL_BASED_OUTPATIENT_CLINIC_OR_DEPARTMENT_OTHER)
Admission: EM | Admit: 2022-06-06 | Discharge: 2022-06-06 | Disposition: A | Discharge status: Home / Self Care | Payer: Medicare HMO | Attending: Emergency Medicine | Admitting: Emergency Medicine

## 2022-06-06 ENCOUNTER — Encounter (HOSPITAL_BASED_OUTPATIENT_CLINIC_OR_DEPARTMENT_OTHER): Payer: Self-pay

## 2022-06-06 ENCOUNTER — Other Ambulatory Visit: Payer: Self-pay

## 2022-06-06 DIAGNOSIS — M5441 Lumbago with sciatica, right side: Secondary | ICD-10-CM | POA: Insufficient documentation

## 2022-06-06 DIAGNOSIS — M545 Low back pain, unspecified: Secondary | ICD-10-CM | POA: Diagnosis not present

## 2022-06-06 DIAGNOSIS — M62830 Muscle spasm of back: Secondary | ICD-10-CM | POA: Diagnosis not present

## 2022-06-06 DIAGNOSIS — M4807 Spinal stenosis, lumbosacral region: Secondary | ICD-10-CM | POA: Diagnosis not present

## 2022-06-06 DIAGNOSIS — M4316 Spondylolisthesis, lumbar region: Secondary | ICD-10-CM | POA: Diagnosis not present

## 2022-06-06 DIAGNOSIS — N281 Cyst of kidney, acquired: Secondary | ICD-10-CM | POA: Diagnosis not present

## 2022-06-06 DIAGNOSIS — R109 Unspecified abdominal pain: Secondary | ICD-10-CM | POA: Diagnosis not present

## 2022-06-06 DIAGNOSIS — M549 Dorsalgia, unspecified: Secondary | ICD-10-CM | POA: Diagnosis present

## 2022-06-06 LAB — CBC WITH DIFFERENTIAL/PLATELET
Abs Immature Granulocytes: 0.01 10*3/uL (ref 0.00–0.07)
Basophils Absolute: 0 10*3/uL (ref 0.0–0.1)
Basophils Relative: 0 %
Eosinophils Absolute: 0.2 10*3/uL (ref 0.0–0.5)
Eosinophils Relative: 2 %
HCT: 44.5 % (ref 36.0–46.0)
Hemoglobin: 14.4 g/dL (ref 12.0–15.0)
Immature Granulocytes: 0 %
Lymphocytes Relative: 42 %
Lymphs Abs: 3 10*3/uL (ref 0.7–4.0)
MCH: 28 pg (ref 26.0–34.0)
MCHC: 32.4 g/dL (ref 30.0–36.0)
MCV: 86.6 fL (ref 80.0–100.0)
Monocytes Absolute: 0.5 10*3/uL (ref 0.1–1.0)
Monocytes Relative: 7 %
Neutro Abs: 3.5 10*3/uL (ref 1.7–7.7)
Neutrophils Relative %: 49 %
Platelets: 268 10*3/uL (ref 150–400)
RBC: 5.14 MIL/uL — ABNORMAL HIGH (ref 3.87–5.11)
RDW: 13.3 % (ref 11.5–15.5)
WBC: 7.2 10*3/uL (ref 4.0–10.5)
nRBC: 0 % (ref 0.0–0.2)

## 2022-06-06 LAB — URINALYSIS, ROUTINE W REFLEX MICROSCOPIC
Bilirubin Urine: NEGATIVE
Glucose, UA: NEGATIVE mg/dL
Hgb urine dipstick: NEGATIVE
Ketones, ur: 15 mg/dL — AB
Nitrite: NEGATIVE
Specific Gravity, Urine: 1.023 (ref 1.005–1.030)
pH: 6 (ref 5.0–8.0)

## 2022-06-06 LAB — COMPREHENSIVE METABOLIC PANEL
ALT: 10 U/L (ref 0–44)
AST: 21 U/L (ref 15–41)
Albumin: 4.4 g/dL (ref 3.5–5.0)
Alkaline Phosphatase: 59 U/L (ref 38–126)
Anion gap: 12 (ref 5–15)
BUN: 11 mg/dL (ref 8–23)
CO2: 22 mmol/L (ref 22–32)
Calcium: 9.7 mg/dL (ref 8.9–10.3)
Chloride: 105 mmol/L (ref 98–111)
Creatinine, Ser: 0.73 mg/dL (ref 0.44–1.00)
GFR, Estimated: >60 mL/min (ref >60)
Glucose, Bld: 98 mg/dL (ref 70–99)
Potassium: 3.8 mmol/L (ref 3.5–5.1)
Sodium: 139 mmol/L (ref 135–145)
Total Bilirubin: 0.6 mg/dL (ref 0.3–1.2)
Total Protein: 8.4 g/dL — ABNORMAL HIGH (ref 6.5–8.1)

## 2022-06-06 LAB — LIPASE, BLOOD: Lipase: 45 U/L (ref 11–51)

## 2022-06-06 LAB — LACTIC ACID, PLASMA
Lactic Acid, Venous: 2.2 mmol/L (ref 0.5–1.9)
Lactic Acid, Venous: 1.4 mmol/L (ref 0.5–1.9)

## 2022-06-06 MED ORDER — ACETAMINOPHEN 325 MG PO TABS
650.0000 mg | ORAL_TABLET | Freq: Once | ORAL | Status: AC
  Administered 2022-06-06: 650 mg via ORAL
  Filled 2022-06-06: qty 2

## 2022-06-06 MED ORDER — LIDOCAINE 5 % EX PTCH: 1.0000 | MEDICATED_PATCH | CUTANEOUS | 0 refills | Status: DC

## 2022-06-06 MED ORDER — KETOROLAC TROMETHAMINE 60 MG/2ML IM SOLN
30.0000 mg | Freq: Once | INTRAMUSCULAR | Status: AC
  Administered 2022-06-06: 30 mg via INTRAMUSCULAR
  Filled 2022-06-06: qty 2

## 2022-06-06 MED ORDER — HYDROCORTISONE SOD SUC (PF) 100 MG IJ SOLR
50.0000 mg | Freq: Once | INTRAMUSCULAR | Status: AC
  Administered 2022-06-06: 50 mg via INTRAVENOUS

## 2022-06-06 MED ORDER — CYCLOBENZAPRINE HCL 5 MG PO TABS
5.0000 mg | ORAL_TABLET | Freq: Once | ORAL | Status: AC
  Administered 2022-06-06: 5 mg via ORAL
  Filled 2022-06-06: qty 1

## 2022-06-06 MED ORDER — FAMOTIDINE 20 MG PO TABS
20.0000 mg | ORAL_TABLET | Freq: Once | ORAL | Status: AC
  Administered 2022-06-06: 20 mg via ORAL
  Filled 2022-06-06: qty 1

## 2022-06-06 MED ORDER — NAPROXEN 500 MG PO TABS: 500.0000 mg | ORAL_TABLET | Freq: Two times a day (BID) | ORAL | 0 refills | Status: DC

## 2022-06-06 MED ORDER — LIDOCAINE VISCOUS HCL 2 % MT SOLN
15.0000 mL | Freq: Once | OROMUCOSAL | Status: AC
  Administered 2022-06-06: 15 mL via ORAL
  Filled 2022-06-06: qty 15

## 2022-06-06 MED ORDER — FOSFOMYCIN TROMETHAMINE 3 G PO PACK
3.0000 g | PACK | Freq: Once | ORAL | Status: AC
  Administered 2022-06-06: 3 g via ORAL
  Filled 2022-06-06: qty 3

## 2022-06-06 MED ORDER — LIDOCAINE 5 % EX PTCH
1.0000 | MEDICATED_PATCH | CUTANEOUS | Status: DC
  Administered 2022-06-06: 1 via TRANSDERMAL
  Filled 2022-06-06: qty 1

## 2022-06-06 MED ORDER — LACTATED RINGERS IV BOLUS
1000.0000 mL | Freq: Once | INTRAVENOUS | Status: AC
  Administered 2022-06-06: 1000 mL via INTRAVENOUS

## 2022-06-06 MED ORDER — HYDROCODONE-ACETAMINOPHEN 5-325 MG PO TABS
1.0000 | ORAL_TABLET | Freq: Once | ORAL | Status: AC
  Administered 2022-06-06: 1 via ORAL
  Filled 2022-06-06: qty 1

## 2022-06-06 MED ORDER — HYDROCORTISONE 20 MG PO TABS: 50.0000 mg | ORAL_TABLET | Freq: Once | ORAL | Status: DC

## 2022-06-06 MED ORDER — CYCLOBENZAPRINE HCL 10 MG PO TABS: 10.0000 mg | ORAL_TABLET | Freq: Two times a day (BID) | ORAL | 0 refills | Status: DC | PRN

## 2022-06-06 MED ORDER — ALUM & MAG HYDROXIDE-SIMETH 200-200-20 MG/5ML PO SUSP
30.0000 mL | Freq: Once | ORAL | Status: AC
  Administered 2022-06-06: 30 mL via ORAL
  Filled 2022-06-06: qty 30

## 2022-06-06 MED ORDER — HYDROCODONE-ACETAMINOPHEN 5-325 MG PO TABS: 1.0000 | ORAL_TABLET | Freq: Four times a day (QID) | ORAL | 0 refills | Status: DC | PRN

## 2022-06-06 NOTE — ED Provider Triage Note (Signed)
Emergency Medicine Provider Triage Evaluation Note  Tracy Larson , a 66 y.o. female  was evaluated in triage.  Pt complains of pain in the lower abdomen and back onset when trying to walk more than 300 feet without her cane, this morning around 10am. Lower abdominal pain x 1 year with report of dislocated disc in back. Muscle spasms are worse today. No associated nausea/vomiting. No falls or injuries. Had to leave jury duty today due to her severe pain.  Review of Systems  Positive: As above Negative: Changes in bowel or bladder habits, fevers, chills, nausea, vomiting  Physical Exam  There were no vitals taken for this visit. Gen:   Awake, no distress   Resp:  Normal effort  MSK:   Moves extremities without difficulty  Other:  Abdomen soft and non tender  Medical Decision Making  Medically screening exam initiated at 1:33 PM.  Appropriate orders placed.  Tracy Larson was informed that the remainder of the evaluation will be completed by another provider, this initial triage assessment does not replace that evaluation, and the importance of remaining in the ED until their evaluation is complete.     Jeannie Fend, PA-C 06/06/22 1337

## 2022-06-06 NOTE — ED Triage Notes (Signed)
Patient here POV from Home.  Endorses Lower ABD Pain and back Pain that began today. Began today after ambulating a long distance without her cane.   No N/V/D.    Somewhat Uncomfortable during Triage. A&Ox4. GCS 15. Ambulatory with Cane.

## 2022-06-06 NOTE — Discharge Instructions (Addendum)
Your CT Imaging revealed the following: FINDINGS:  Lower chest: Small linear densities in medial right lower lung field  may suggest minimal scarring.    Hepatobiliary: No focal abnormalities are seen in liver. There is no  dilation of bile ducts. Gallbladder is not distended.    Pancreas: Unremarkable.    Spleen: Unremarkable.    Adrenals/Urinary Tract: Adrenals are unremarkable. There is no  hydronephrosis. There are no renal or ureteral stones. There is a 8  mm smooth marginated high density nodule in the upper pole of right  kidney. There is a 5 mm hypodense nodule in the margin of lower pole  of left kidney. These lesions were shown to be proteinaceous cysts  in MR abdomen done on 02/09/2022. No further follow-up is  recommended. Small pocket of air is seen in the lumen of urinary  bladder. There is no significant wall thickening in the bladder.    Stomach/Bowel: Stomach is unremarkable. Small bowel loops are not  dilated. Appendix is not seen. There is no pericecal inflammation.  There is no significant wall thickening in colon. There is no  pericolic stranding or fluid collection.    Vascular/Lymphatic: Unremarkable.    Reproductive: Unremarkable.    Other: There is no ascites or pneumoperitoneum. Umbilical hernia  containing fat is seen.    Musculoskeletal: Degenerative changes are noted in lower thoracic  spine, particularly prominent at T10-T11 level. There is minimal  anterolisthesis at L4-L5 level. Spinal stenosis and encroachment of  neural foramina is seen at L4-L5 and L5-S1 levels.    IMPRESSION:  There is no evidence of intestinal obstruction or pneumoperitoneum.  There is no hydronephrosis.    There is small pocket of air in the urinary bladder which may be due  to recent catheterization or suggest cystitis.    There are few subcentimeter proteinaceous/hemorrhagic cysts in the  kidneys with no significant interval change. Thoracic and lumbar   spondylosis.

## 2022-06-06 NOTE — ED Notes (Signed)
CRITICAL VALUE STICKER  CRITICAL VALUE:Lactate 2.2  RECEIVER (on-site recipient of call):Carmon Ginsberg, RN  DATE & TIME NOTIFIED:   MESSENGER (representative from lab):  MD NOTIFIED: Dr. Karene Fry  TIME OF NOTIFICATION:1638  RESPONSE:

## 2022-06-06 NOTE — ED Notes (Signed)
Pt states she has chronic pain. Has been relieved some since being here today. Pain in back and RLQ. Pain 3/10 when abdomen contracts.

## 2022-06-06 NOTE — ED Provider Notes (Signed)
[facility] EMERGENCY DEPT Provider Note   CSN: [id number] Arrival date & time: 06/06/22  1222     History  Chief Complaint  Patient presents with   Back Pain    Tracy Larson is a 66 y.o. female.   Back Pain    66 year old female with medical history significant for gastric ulcers who presents to the emergency department with a chief complaint of pain in the lower abdomen and back.  She endorses muscle spasms.  She also endorses a history of degenerative disc in her back.  She had severe pain during jury duty which resulted in her presentation today.  She denies any nausea or vomiting.  She denies any urinary symptoms.  She denies any changes in bowel or bladder habits, no fevers, chills, no recent falls or trauma to the back.  She is moving her bowels and passing gas.  Home Medications Prior to Admission medications   Medication Sig Start Date End Date Taking? Authorizing Provider  cyclobenzaprine (FLEXERIL) 10 MG tablet Take 1 tablet (10 mg total) by mouth 2 (two) times daily as needed for muscle spasms. 06/06/22  Yes Ernie Avena, MD  HYDROcodone-acetaminophen (NORCO/VICODIN) 5-325 MG tablet Take 1-2 tablets by mouth every 6 (six) hours as needed for severe pain. 06/06/22  Yes Ernie Avena, MD  lidocaine (LIDODERM) 5 % Place 1 patch onto the skin daily. Remove & Discard patch within 12 hours or as directed by MD 06/06/22  Yes Ernie Avena, MD  naproxen (NAPROSYN) 500 MG tablet Take 1 tablet (500 mg total) by mouth 2 (two) times daily. 06/06/22  Yes Ernie Avena, MD  hydrocortisone (CORTEF) 10 MG tablet TAKE 3 TABLETS BY MOUTH DAILY IN AM AND 3 TABLET IN  PM 01/31/22   [name], MD  hydrocortisone sodium succinate (SOLU-CORTEF) 100 MG injection Inject 100 mg as needed for adrenal crisis 01/31/22   [name], MD  Misc Natural Products [facility] COMPLEX PO) Take 2 tablets by mouth daily. 500 mg per patient    [provider]       Allergies    Other    Review of Systems   Review of Systems  Musculoskeletal:  Positive for back pain.  All other systems reviewed and are negative.   Physical Exam Updated Vital Signs BP 114/74   Pulse 67   Temp 98 F (36.7 C) (Oral)   Resp 18   Ht 5\' 2"  (1.575 m)   Wt 88.7 kg   SpO2 100%   BMI 35.77 kg/m  Physical Exam Vitals and nursing note reviewed.  Constitutional:      General: She is not in acute distress.    Appearance: She is well-developed.  HENT:     Head: Normocephalic and atraumatic.  Eyes:     Conjunctiva/sclera: Conjunctivae normal.  Cardiovascular:     Rate and Rhythm: Normal rate and regular rhythm.     Heart sounds: No murmur heard. Pulmonary:     Effort: Pulmonary effort is normal. No respiratory distress.     Breath sounds: Normal breath sounds.  Abdominal:     Palpations: Abdomen is soft.     Tenderness: There is abdominal tenderness.     Comments: Mild generalized tenderness  Musculoskeletal:        General: No swelling.     Cervical back: Neck supple.     Comments: Positive straight leg raise test on the right  Skin:    General: Skin is warm and dry.  Capillary Refill: Capillary refill takes less than 2 seconds.  Neurological:     Mental Status: She is alert.     Comments: 5 out of 5 strength in all 4 extremities with intact sensation to light touch, no cranial nerve deficit  Psychiatric:        Mood and Affect: Mood normal.     ED Results / Procedures / Treatments   Labs (all labs ordered are listed, but only abnormal results are displayed) Labs Reviewed  CBC WITH DIFFERENTIAL/PLATELET - Abnormal; Notable for the following components:      Result Value   RBC 5.14 (*)    All other components within normal limits  COMPREHENSIVE METABOLIC PANEL - Abnormal; Notable for the following components:   Total Protein 8.4 (*)    All other components within normal limits  URINALYSIS, ROUTINE W REFLEX MICROSCOPIC - Abnormal; Notable  for the following components:   Ketones, ur 15 (*)    Protein, ur TRACE (*)    Leukocytes,Ua LARGE (*)    Bacteria, UA RARE (*)    All other components within normal limits  LACTIC ACID, PLASMA - Abnormal; Notable for the following components:   Lactic Acid, Venous 2.2 (*)    All other components within normal limits  LIPASE, BLOOD  LACTIC ACID, PLASMA    EKG None  Radiology No results found.  Procedures Procedures    Medications Ordered in ED Medications  HYDROcodone-acetaminophen (NORCO/VICODIN) 5-325 MG per tablet 1 tablet (1 tablet Oral Given 06/06/22 1340)  ketorolac (TORADOL) injection 30 mg (30 mg Intramuscular Given 06/06/22 1815)  fosfomycin (MONUROL) packet 3 g (3 g Oral Given 06/06/22 1932)  lactated ringers bolus 1,000 mL (0 mLs Intravenous Stopped 06/06/22 2030)  cyclobenzaprine (FLEXERIL) tablet 5 mg (5 mg Oral Given 06/06/22 2027)  acetaminophen (TYLENOL) tablet 650 mg (650 mg Oral Given 06/06/22 2028)  alum & mag hydroxide-simeth (MAALOX/MYLANTA) 200-200-20 MG/5ML suspension 30 mL (30 mLs Oral Given 06/06/22 2030)    And  lidocaine (XYLOCAINE) 2 % viscous mouth solution 15 mL (15 mLs Oral Given 06/06/22 2030)  famotidine (PEPCID) tablet 20 mg (20 mg Oral Given 06/06/22 2028)  HYDROcodone-acetaminophen (NORCO/VICODIN) 5-325 MG per tablet 1 tablet (1 tablet Oral Given 06/06/22 2048)  hydrocortisone sodium succinate (SOLU-CORTEF) 100 MG injection 50 mg (50 mg Intravenous Given 06/06/22 2157)    ED Course/ Medical Decision Making/ A&P                           Medical Decision Making Amount and/or Complexity of Data Reviewed Labs: ordered. Radiology: ordered.  Risk OTC drugs. Prescription drug management.    66 year old female with medical history significant for gastric ulcers who presents to the emergency department with a chief complaint of pain in the lower abdomen and back.  She endorses muscle spasms.  She also endorses a history of degenerative disc in her  back.  She had severe pain during jury duty which resulted in her presentation today.  She denies any nausea or vomiting.  She denies any urinary symptoms.  She denies any changes in bowel or bladder habits, no fevers, chills, no recent falls or trauma to the back.  She is moving her bowels and passing gas.  The patient is able to ambulate and is hemodynamically stable. There are no red flag symptoms. Specifically, she denies: -Being on an anticoagulant or blood thinner -Using IV drugs -Having a history of AAA -Having saddle anesthesia -Having  urinary or fecal incontinence -Having any recent falls or trauma  Differential Diagnoses: I do not think that Donnetta Hail is experiencing cauda equina syndrome, abdominal aortic aneurysm, epidural abscess, aortic dissection, spinal hematoma, nephrolithiasis, spinal metastasis, discitis, or an acute fracture.   While in the ED, I provided the patient with: Medications  HYDROcodone-acetaminophen (NORCO/VICODIN) 5-325 MG per tablet 1 tablet (1 tablet Oral Given 06/06/22 1340)  ketorolac (TORADOL) injection 30 mg (30 mg Intramuscular Given 06/06/22 1815)  fosfomycin (MONUROL) packet 3 g (3 g Oral Given 06/06/22 1932)  lactated ringers bolus 1,000 mL (0 mLs Intravenous Stopped 06/06/22 2030)  cyclobenzaprine (FLEXERIL) tablet 5 mg (5 mg Oral Given 06/06/22 2027)  acetaminophen (TYLENOL) tablet 650 mg (650 mg Oral Given 06/06/22 2028)  alum & mag hydroxide-simeth (MAALOX/MYLANTA) 200-200-20 MG/5ML suspension 30 mL (30 mLs Oral Given 06/06/22 2030)    And  lidocaine (XYLOCAINE) 2 % viscous mouth solution 15 mL (15 mLs Oral Given 06/06/22 2030)  famotidine (PEPCID) tablet 20 mg (20 mg Oral Given 06/06/22 2028)  HYDROcodone-acetaminophen (NORCO/VICODIN) 5-325 MG per tablet 1 tablet (1 tablet Oral Given 06/06/22 2048)  hydrocortisone sodium succinate (SOLU-CORTEF) 100 MG injection 50 mg (50 mg Intravenous Given 06/06/22 2157)    Considered nephrolithiasis as  etiology of the patient's symptoms versus muscle spasms and lumbar strain versus lumbar disc disease or stenosis with radiculopathy.  CT Stone Study: FINDINGS:  Lower chest: Small linear densities in medial right lower lung field  may suggest minimal scarring.    Hepatobiliary: No focal abnormalities are seen in liver. There is no  dilation of bile ducts. Gallbladder is not distended.    Pancreas: Unremarkable.    Spleen: Unremarkable.    Adrenals/Urinary Tract: Adrenals are unremarkable. There is no  hydronephrosis. There are no renal or ureteral stones. There is a 8  mm smooth marginated high density nodule in the upper pole of right  kidney. There is a 5 mm hypodense nodule in the margin of lower pole  of left kidney. These lesions were shown to be proteinaceous cysts  in MR abdomen done on 02/09/2022. No further follow-up is  recommended. Small pocket of air is seen in the lumen of urinary  bladder. There is no significant wall thickening in the bladder.    Stomach/Bowel: Stomach is unremarkable. Small bowel loops are not  dilated. Appendix is not seen. There is no pericecal inflammation.  There is no significant wall thickening in colon. There is no  pericolic stranding or fluid collection.    Vascular/Lymphatic: Unremarkable.    Reproductive: Unremarkable.    Other: There is no ascites or pneumoperitoneum. Umbilical hernia  containing fat is seen.    Musculoskeletal: Degenerative changes are noted in lower thoracic  spine, particularly prominent at T10-T11 level. There is minimal  anterolisthesis at L4-L5 level. Spinal stenosis and encroachment of  neural foramina is seen at L4-L5 and L5-S1 levels.    IMPRESSION:  There is no evidence of intestinal obstruction or pneumoperitoneum.  There is no hydronephrosis.    There is small pocket of air in the urinary bladder which may be due  to recent catheterization or suggest cystitis.    There are few subcentimeter  proteinaceous/hemorrhagic cysts in the  kidneys with no significant interval change. Thoracic and lumbar  spondylosis.     On reassessment, the patient was able to ambulate, symptomatically improved following the above interventions. This presentation is most consistent with low back pain with radiculopathy.  Given the patient's reassuring  presentation, I believe that she is safe for discharge.  I provided ED return precautions, specifically for the symptoms which are most concerning (e.g., saddle anesthesia, urinary or bowel incontinence or retention, changing or worsening pain), which would necessitate immediate return.  I encouraged the patient to followup with their PCP.   Final Clinical Impression(s) / ED Diagnoses Final diagnoses:  Acute right-sided low back pain with right-sided sciatica  Renal cyst  Spinal stenosis of lumbosacral region  Muscle spasm of back    Rx / DC Orders ED Discharge Orders          Ordered    Ambulatory referral to Neurosurgery        06/06/22 2119    lidocaine (LIDODERM) 5 %  Every 24 hours        06/06/22 2123    naproxen (NAPROSYN) 500 MG tablet  2 times daily        06/06/22 2123    HYDROcodone-acetaminophen (NORCO/VICODIN) 5-325 MG tablet  Every 6 hours PRN        06/06/22 2123    cyclobenzaprine (FLEXERIL) 10 MG tablet  2 times daily PRN        06/06/22 2123              Ernie Avena, MD 06/10/22 1620

## 2022-06-21 ENCOUNTER — Encounter: Payer: Self-pay | Admitting: Obstetrics and Gynecology

## 2022-06-21 ENCOUNTER — Ambulatory Visit (INDEPENDENT_AMBULATORY_CARE_PROVIDER_SITE_OTHER): Payer: Medicare HMO | Admitting: Obstetrics and Gynecology

## 2022-06-21 ENCOUNTER — Other Ambulatory Visit (HOSPITAL_COMMUNITY)
Admission: RE | Admit: 2022-06-21 | Discharge: 2022-06-21 | Disposition: A | Discharge status: Home / Self Care | Payer: Medicare HMO | Source: Ambulatory Visit | Attending: Obstetrics and Gynecology | Admitting: Obstetrics and Gynecology

## 2022-06-21 VITALS — BP 116/75 | HR 76 | Ht 62.0 in | Wt 191.6 lb

## 2022-06-21 DIAGNOSIS — Z01419 Encounter for gynecological examination (general) (routine) without abnormal findings: Secondary | ICD-10-CM | POA: Insufficient documentation

## 2022-06-21 DIAGNOSIS — Z1151 Encounter for screening for human papillomavirus (HPV): Secondary | ICD-10-CM | POA: Insufficient documentation

## 2022-06-21 DIAGNOSIS — R102 Pelvic and perineal pain: Secondary | ICD-10-CM | POA: Diagnosis not present

## 2022-06-21 DIAGNOSIS — Z124 Encounter for screening for malignant neoplasm of cervix: Secondary | ICD-10-CM | POA: Diagnosis not present

## 2022-06-21 DIAGNOSIS — R69 Illness, unspecified: Secondary | ICD-10-CM | POA: Diagnosis not present

## 2022-06-21 NOTE — Progress Notes (Signed)
   NEW GYNECOLOGY VISIT  Subjective:  Tracy Larson is a 66 y.o. G4P4 menopausal F p/f chronic RLQ pain.   RLQ pain x 3 years, worsening for past 1 year. Describes as severe contractions/spasms.  Has been evaluated by two different PCPs, general surgery and urology without an answer or improvement in her symptoms. No associated n/v, diarrhea/constipation, fevers or chills.   Not sexually active.  No bleeding in several years but unsure of exact age of menopause CS x4 w/ BTL No pap in our system  I personally reviewed the following components of her work up thus far: 12/08/21 Pelvic Ultrasound (Transabdominal only):  Limited evaluation due to patient body habitus and inability to tolerate the exam. There is limited visualization of the uterus and the ovaries are not visualized transabdominally.  2. 02/09/22 MR Abdomen W WO Contrast Several tiny benign bilateral Bosniak category 1 and 2 renal cysts, corresponding with lesions seen on recent CT. No evidence of renal neoplasm or other significant abnormality.  3. 06/06/22 CT Renal Stone Study There is no evidence of intestinal obstruction or pneumoperitoneum. There is no hydronephrosis. There is small pocket of air in the urinary bladder which may be due to recent catheterization or suggest cystitis. There are few subcentimeter proteinaceous/hemorrhagic cysts in the kidneys with no significant interval change. Thoracic and lumbar spondylosis.  I personally reviewed the notes from Dr. Donell Beers on 01/12/22, Jiles Prows NP on 04/19/22 and Dr. Karene Fry on 06/06/22.  Objective:   Vitals:   06/21/22 0902  BP: 116/75  Pulse: 76  Weight: 191 lb 9.6 oz (86.9 kg)  Height: 5\' 2"  (1.575 m)   General:  Alert, oriented and cooperative. Patient is in no acute distress.  Skin: Skin is warm and dry. No rash noted.   Cardiovascular: Normal heart rate noted  Respiratory: Normal respiratory effort, no problems with respiration noted  Abdomen:  Soft, tender in RLQ without rebound/guarding   Pelvic: NEFG without lesions. Atrophic vagina with thin mucosa and mild stenosis. Unable to visualize cervix due to pain with exam. +R adnexal tenderness and CDS tenderness. Uterus not well palpated due to habitus and pain with exam    Assessment and Plan:  Tracy Larson is a 66 y.o. with chronic, worsening RLQ pain  1. Pelvic pain Exam limited by pain. Pain elicited with palpation of right adnexa and posterior cul de sac. No pain elicited with palpation of levators/obturators. Will obtain ultrasound with transvaginal component for better assessment of gyn organs.  May also have component of genitourinary syndrome of menopause. Counseled to expected internal portion of pelvic 76 - US PELVIC COMPLETE WITH TRANSVAGINAL; Future  2. Cervical cancer screening Unable to fully visualize cervix due to vaginal stenosis and patient's pain. Blind pap collected. - Cytology - PAP( [facility])  Return for pending ultrasound results.  Future Appointments  Date Time Provider Department Center  06/29/2022  8:00 AM 07/01/2022, NP [facility] None  07/16/2022 10:00 AM [facility] US1 [facility]  02/13/2023  8:00 AM 02/15/2023, MD [facility] None   [name], MD

## 2022-06-21 NOTE — Progress Notes (Signed)
NGYN presents to establish care. Pt c/o of intermittent right abdominal pain for 2 years. Pain increased 1 year ago. Pt has tried several treatments with no relief. Most recent treatment 06/06/22. Pt states last PAP 07/2021, last mammogram 08/2021.

## 2022-06-26 LAB — CYTOLOGY - PAP
Chlamydia: NEGATIVE
Comment: NEGATIVE
Comment: NEGATIVE
Comment: NORMAL
Diagnosis: NEGATIVE
High risk HPV: NEGATIVE
Neisseria Gonorrhea: NEGATIVE

## 2022-06-29 ENCOUNTER — Ambulatory Visit (INDEPENDENT_AMBULATORY_CARE_PROVIDER_SITE_OTHER): Payer: Medicare HMO | Admitting: Nurse Practitioner

## 2022-06-29 VITALS — BP 110/76 | HR 78 | Temp 97.6°F | Ht 62.0 in | Wt 190.4 lb

## 2022-06-29 DIAGNOSIS — M545 Low back pain, unspecified: Secondary | ICD-10-CM | POA: Insufficient documentation

## 2022-06-29 DIAGNOSIS — M5441 Lumbago with sciatica, right side: Secondary | ICD-10-CM

## 2022-06-29 MED ORDER — HYDROCODONE-ACETAMINOPHEN 5-325 MG PO TABS: 1.0000 | ORAL_TABLET | Freq: Three times a day (TID) | ORAL | 0 refills | Status: DC | PRN

## 2022-06-29 MED ORDER — NAPROXEN 500 MG PO TABS: 500.0000 mg | ORAL_TABLET | Freq: Two times a day (BID) | ORAL | 0 refills | Status: DC

## 2022-06-29 MED ORDER — CYCLOBENZAPRINE HCL 5 MG PO TABS: 5.0000 mg | ORAL_TABLET | Freq: Two times a day (BID) | ORAL | 1 refills | Status: DC | PRN

## 2022-06-29 MED ORDER — LIDOCAINE 5 % EX PTCH: 1.0000 | MEDICATED_PATCH | CUTANEOUS | 0 refills | Status: DC

## 2022-06-29 NOTE — Progress Notes (Signed)
Established Patient Office Visit  Subjective   Patient ID: Tracy Larson, female    DOB: [birth year]  Age: 66 y.o. MRN: [id number]  Chief Complaint  Patient presents with   Back Pain    Back pain: Patient went to the emergency department on 06/06/2022 for abdominal and low back pain.  CT scan found evidence of degenerative changes in lower thoracic spine particularly at T10-T11 as well as minimal anteriolisthesis at L4-L5 and spinal stenosis with encroachment of neural for [facility] at L4-L5 and L5-S1 levels.  Patient was recommended to follow-up with [facility].  She reports that she did call their office but did not receive a call back regarding scheduling an appointment.  Since being in the emergency department she continues to have intermittent low back pain as well as her chronic abdominal pain.  Back pain radiates down her right leg and she also feels tingling associated with the back pain.  She is using Flexeril, hydrocodone-acetaminophen, lidocaine patch, naproxen with moderate improvement in her pain.  She denies weakness in extremities, does report the tingling in the right leg but feels that she has full sensation, denies new bowel/bladder incontinence.    Review of Systems  Respiratory:  Negative for shortness of breath.   Cardiovascular:  Negative for chest pain.  : See HPI    Objective:     BP 110/76   Pulse 78   Temp 97.6 F (36.4 C) (Oral)   Ht 5\' 2"  (1.575 m)   Wt 190 lb 6 oz (86.4 kg)   SpO2 97%   BMI 34.82 kg/m    Physical Exam Vitals reviewed.  Constitutional:      General: She is not in acute distress.    Appearance: Normal appearance.  HENT:     Head: Normocephalic and atraumatic.  Neck:     Vascular: No carotid bruit.  Cardiovascular:     Rate and Rhythm: Normal rate and regular rhythm.     Pulses: Normal pulses.     Heart sounds: Normal heart sounds.  Pulmonary:     Effort: Pulmonary effort is normal.     Breath  sounds: Normal breath sounds.  Musculoskeletal:     Lumbar back: Positive right straight leg raise test.  Skin:    General: Skin is warm and dry.  Neurological:     General: No focal deficit present.     Mental Status: She is alert and oriented to person, place, and time.     Sensory: Sensation is intact.     Motor: Motor function is intact.     Coordination: Coordination is intact.     Gait: Gait is intact.  Psychiatric:        Mood and Affect: Mood normal.        Behavior: Behavior normal.        Judgment: Judgment normal.      No results found for any visits on 06/29/22.    The 10-year ASCVD risk score (Arnett DK, et al., 2019) is: 5.4%    Assessment & Plan:   Problem List Items Addressed This Visit       Other   Acute low back pain - Primary    Subacute. Will refill patient's cyclobenzaprine, norco, lidocaine patch, and naproxen.  Patient educated not to take cyclobenzaprine and Norco at the same time as well as to avoid driving or operating heavy machinery when taking either of those medications.  She was urged to call [facility]  neurosurgery specialist as soon as possible to be seen as soon as possible.  I did discuss that the tingling in her leg could be a sign of nerve impingement that potentially could be chronic if not treated appropriately.  No foot drop noted on exam, strength appears equal bilaterally, patient does have significant pain with right straight leg test.  We did discuss that if she experiences worsening back pain, worsening sensory changes, weakness, inability to control bowels or bladder that she needs to call 911.  Patient reported understanding.      Relevant Medications   cyclobenzaprine (FLEXERIL) 5 MG tablet   lidocaine (LIDODERM) 5 %   naproxen (NAPROSYN) 500 MG tablet   HYDROcodone-acetaminophen (NORCO/VICODIN) 5-325 MG tablet    Return in about 6 weeks (around 08/10/2022) for F/u with Maralyn Sago.    Elenore Paddy, NP

## 2022-06-29 NOTE — Assessment & Plan Note (Addendum)
Subacute. Will refill patient's cyclobenzaprine, norco, lidocaine patch, and naproxen.  Patient educated not to take cyclobenzaprine and Norco at the same time as well as to avoid driving or operating heavy machinery when taking either of those medications.  She was urged to call [place] [facility] specialist as soon as possible to be seen as soon as possible.  I did discuss that the tingling in her leg could be a sign of nerve impingement that potentially could be chronic if not treated appropriately.  No foot drop noted on exam, strength appears equal bilaterally, patient does have significant pain with right straight leg test.  We did discuss that if she experiences worsening back pain, worsening sensory changes, weakness, inability to control bowels or bladder that she needs to call 911.  Patient reported understanding.

## 2022-06-29 NOTE — Patient Instructions (Addendum)
If you experience any of the following while waiting to see the neurosurgeon please call 911: numbness, weakness, incontinence of bowel or bladder, severe pain.  [facility]: [phone] [address], [place]  No driving or operating heavy machinery when using hydrocodone-acetaminophen or cyclobenzaprine. Do not take the hydrocodone-acetaminophen at the same time as the cyclobenzaprine separate them by at least 4 hours.

## 2022-07-11 DIAGNOSIS — Z0000 Encounter for general adult medical examination without abnormal findings: Secondary | ICD-10-CM | POA: Diagnosis not present

## 2022-07-12 ENCOUNTER — Telehealth: Payer: Self-pay

## 2022-07-12 NOTE — Telephone Encounter (Signed)
Pt PA for Lidocaine patched is send to plan waiting for approval [facility]

## 2022-07-16 ENCOUNTER — Ambulatory Visit
Admission: RE | Admit: 2022-07-16 | Discharge: 2022-07-16 | Disposition: A | Discharge status: Home / Self Care | Payer: Medicare HMO | Source: Ambulatory Visit | Attending: Obstetrics and Gynecology | Admitting: Obstetrics and Gynecology

## 2022-07-16 DIAGNOSIS — R102 Pelvic and perineal pain: Secondary | ICD-10-CM | POA: Diagnosis not present

## 2022-07-16 DIAGNOSIS — N858 Other specified noninflammatory disorders of uterus: Secondary | ICD-10-CM | POA: Diagnosis not present

## 2022-07-16 DIAGNOSIS — Z780 Asymptomatic menopausal state: Secondary | ICD-10-CM | POA: Diagnosis not present

## 2022-07-17 ENCOUNTER — Telehealth: Payer: Self-pay | Admitting: *Deleted

## 2022-07-17 DIAGNOSIS — M4316 Spondylolisthesis, lumbar region: Secondary | ICD-10-CM | POA: Diagnosis not present

## 2022-07-17 DIAGNOSIS — Z6834 Body mass index (BMI) 34.0-34.9, adult: Secondary | ICD-10-CM | POA: Diagnosis not present

## 2022-07-17 DIAGNOSIS — R200 Anesthesia of skin: Secondary | ICD-10-CM | POA: Diagnosis not present

## 2022-07-17 NOTE — Progress Notes (Signed)
Advised US WNL. Would like in person appt to discuss results. Schedulers to call pt.

## 2022-07-17 NOTE — Telephone Encounter (Signed)
TC to advise of pelvic US WNL. Per Dr. Mikel Cella offered pt follow up appt to discuss. Pt would like in person appt. Schedulers to call her to make the appointment.

## 2022-07-18 ENCOUNTER — Encounter: Payer: Self-pay | Admitting: Obstetrics and Gynecology

## 2022-07-20 ENCOUNTER — Encounter: Payer: Self-pay | Admitting: Nurse Practitioner

## 2022-08-01 ENCOUNTER — Telehealth: Payer: Self-pay | Admitting: Nurse Practitioner

## 2022-08-01 NOTE — Telephone Encounter (Signed)
Left message for patient to call back and schedule Medicare Annual Wellness Visit (AWV) in office.   Please offer to do virtually or by telephone.   Last AWV  due 05/02/2018 awvi per [facility]  Please schedule at any time with [facility]  30 minute appointment  Any questions, please contact me at [phone]   Thank you,   [place] [facility] [place] [facility] ??[phone] or ??[phone]

## 2022-08-07 DIAGNOSIS — M4316 Spondylolisthesis, lumbar region: Secondary | ICD-10-CM | POA: Diagnosis not present

## 2022-08-16 ENCOUNTER — Ambulatory Visit (INDEPENDENT_AMBULATORY_CARE_PROVIDER_SITE_OTHER): Payer: Medicare HMO

## 2022-08-16 VITALS — Ht 62.0 in | Wt 189.0 lb

## 2022-08-16 DIAGNOSIS — Z604 Social exclusion and rejection: Secondary | ICD-10-CM

## 2022-08-16 DIAGNOSIS — Z1231 Encounter for screening mammogram for malignant neoplasm of breast: Secondary | ICD-10-CM

## 2022-08-16 DIAGNOSIS — Z0000 Encounter for general adult medical examination without abnormal findings: Secondary | ICD-10-CM

## 2022-08-16 DIAGNOSIS — R69 Illness, unspecified: Secondary | ICD-10-CM | POA: Diagnosis not present

## 2022-08-16 NOTE — Progress Notes (Cosign Needed Addendum)
I connected with  Tracy Larson on 08/16/22 by a audio/telephone and verified that I am speaking with the correct person using two identifiers. I spoke with patient and conducted via telephone. NOT in person  Patient Location: Home  Provider Location: Office/Clinic  I discussed the limitations of evaluation and management by telemedicine. The patient expressed understanding and agreed to proceed.  Subjective:   Tracy Larson is a 67 y.o. female who presents for Medicare Annual (Subsequent) preventive examination.  Review of Systems    Cardiac Risk Factors include: advanced age (>43mn, >>25women);obesity (BMI >30kg/m2);sedentary lifestyle    Objective:    Today's Vitals   08/16/22 0810 08/16/22 0811  Weight: 189 lb (85.7 kg)   Height: '5\' 2"'$  (1.575 m)   PainSc:  9    Body mass index is 34.57 kg/m.     08/16/2022    8:23 AM 06/06/2022    1:37 PM 01/19/2022    5:56 AM 12/19/2021    8:04 PM 03/22/2016    5:10 PM 01/26/2015    5:51 PM 11/27/2014    4:55 PM  Advanced Directives  Does Patient Have a Medical Advance Directive? No No No No No No No  Would patient like information on creating a medical advance directive? No - Patient declined No - Patient declined  No - Patient declined  No - patient declined information No - patient declined information    Current Medications (verified) Outpatient Encounter Medications as of 08/16/2022  Medication Sig   cyclobenzaprine (FLEXERIL) 5 MG tablet Take 1 tablet (5 mg total) by mouth 2 (two) times daily as needed for muscle spasms.   HYDROcodone-acetaminophen (NORCO/VICODIN) 5-325 MG tablet Take 1 tablet by mouth every 8 (eight) hours as needed for severe pain.   hydrocortisone (CORTEF) 10 MG tablet TAKE 3 TABLETS BY MOUTH DAILY IN AM AND 3 TABLET IN  PM   hydrocortisone sodium succinate (SOLU-CORTEF) 100 MG injection Inject 100 mg as needed for adrenal crisis   Misc Natural Products (GINSENG COMPLEX PO) Take 2 tablets by mouth daily.  500 mg per patient   naproxen (NAPROSYN) 500 MG tablet Take 1 tablet (500 mg total) by mouth 2 (two) times daily.   lidocaine (LIDODERM) 5 % Place 1 patch onto the skin daily. Remove & Discard patch within 12 hours or as directed by MD (Patient not taking: Reported on 08/16/2022)   No facility-administered encounter medications on file as of 08/16/2022.    Allergies (verified) Other   History: Past Medical History:  Diagnosis Date   Adrenal hyperplasia ([place]    Multiple gastric ulcers    Shingles    "internal"   Past Surgical History:  Procedure Laterality Date   CESAREAN SECTION     TUBAL LIGATION  1985   Family History  Problem Relation Age of Onset   Breast cancer Neg Hx    Social History   Socioeconomic History   Marital status: Divorced    Spouse name: Not on file   Number of children: 4   Years of education: Not on file   Highest education level: Some college, no degree  Occupational History   Not on file  Tobacco Use   Smoking status: Never   Smokeless tobacco: Never  Substance and Sexual Activity   Alcohol use: No   Drug use: No   Sexual activity: Not Currently    Birth control/protection: Post-menopausal  Other Topics Concern   Not on file  Social History Narrative  Lives alone   Social Determinants of Health   Financial Resource Strain: Unknown (08/16/2022)   Overall Financial Resource Strain (CARDIA)    Difficulty of Paying Living Expenses: Patient refused  Food Insecurity: Not on file  Transportation Needs: No Transportation Needs (08/16/2022)   PRAPARE - Hydrologist (Medical): No    Lack of Transportation (Non-Medical): No  Physical Activity: Inactive (08/16/2022)   Exercise Vital Sign    Days of Exercise per Week: 0 days    Minutes of Exercise per Session: 0 min  Stress: No Stress Concern Present (08/16/2022)   [place]    Feeling of Stress : Not  at all  Social Connections: Socially Isolated (08/16/2022)   Social Connection and Isolation Panel [NHANES]    Frequency of Communication with Friends and Family: Never    Frequency of Social Gatherings with Friends and Family: Never    Attends Religious Services: Never    Marine scientist or Organizations: No    Attends Archivist Meetings: Never    Marital Status: Widowed    Tobacco Counseling Counseling given: Not Answered   Clinical Intake:  Pre-visit preparation completed: Yes  Pain : 0-10 Pain Score: 9  Pain Location: Abdomen Pain Orientation: Right Pain Descriptors / Indicators: Squeezing, Cramping, Pressure Pain Onset: Other (comment) Pain Frequency: Intermittent Pain Relieving Factors: pain medication, heating pad  Pain Relieving Factors: pain medication, heating pad  BMI - recorded: 34.57 Nutritional Status: BMI > 30  Obese Nutritional Risks: None Diabetes: No  How often do you need to have someone help you when you read instructions, pamphlets, or other written materials from your doctor or pharmacy?: 1 - Never  Diabetic?no  Interpreter Needed?: No  Information entered by :: B.Yasmyn Bellisario,LPN   Activities of Daily Living    08/16/2022    8:29 AM  In your present state of health, do you have any difficulty performing the following activities:  Hearing? 0  Vision? 0  Difficulty concentrating or making decisions? 0  Walking or climbing stairs? 1  Dressing or bathing? 0  Doing errands, shopping? 1  Comment dificult due to pain  Preparing Food and eating ? Y  Comment sometimes due to pain  Using the Toilet? N  In the past six months, have you accidently leaked urine? N  Do you have problems with loss of bowel control? N  Managing your Medications? N  Managing your Finances? N  Housekeeping or managing your Housekeeping? N    Patient Care Team: Ailene Ards, NP as PCP - General (Nurse Practitioner)  Indicate any recent Medical  Services you may have received from other than [facility] providers in the past year (date may be approximate).     Assessment:   This is a routine wellness examination for [place].  Hearing/Vision screen Hearing Screening - Comments:: Adequate hearing Vision Screening - Comments:: Readers only:adequate vision 2020 last exam Eye Specialist [facility]  Dietary issues and exercise activities discussed: Current Exercise Habits: The patient does not participate in regular exercise at present, Exercise limited by: neurologic condition(s);psychological condition(s)   Goals Addressed   None    Depression Screen    08/16/2022    8:20 AM 04/19/2022   10:18 AM 07/17/2017    2:51 PM 04/19/2017    1:48 PM 01/26/2015    6:58 PM 12/14/2014    5:58 PM 12/07/2014   11:42 AM  PHQ 2/9 Scores  PHQ -  2 Score 0 0 0 0 0 0 0  PHQ- 9 Score  0         Fall Risk    08/16/2022    8:16 AM 04/19/2022   10:18 AM 07/17/2017    2:51 PM 04/19/2017    1:48 PM 11/11/2014   11:28 AM  Fall Risk   Falls in the past year? 0 0 No No No  Number falls in past yr: 0 0     Injury with Fall? 0 0     Risk for fall due to : No Fall Risks No Fall Risks     Follow up Education provided;Falls prevention discussed Falls evaluation completed       FALL RISK PREVENTION PERTAINING TO THE HOME:  Any stairs in or around the home? No  If so, are there any without handrails? No  Home free of loose throw rugs in walkways, pet beds, electrical cords, etc? Yes  Adequate lighting in your home to reduce risk of falls? Yes   ASSISTIVE DEVICES UTILIZED TO PREVENT FALLS:  Life alert? No  Use of a cane, walker or w/c? Yes cane Grab bars in the bathroom? Yes  Shower chair or bench in shower? Yes  Elevated toilet seat or a handicapped toilet? Yes but does not use  Cognitive Function:        08/16/2022    8:32 AM  6CIT Screen  What Year? 0 points  What month? 0 points  What time? 0 points  Count back from 20 0 points  Repeat  phrase 0 points    Immunizations Immunization History  Administered Date(s) Administered   PPD Test 02/22/2012, 03/02/2014   Tdap 11/14/2011    TDAP status: Up to date  Flu Vaccine status: Up to date  Pneumococcal vaccine status: Declined,  Education has been provided regarding the importance of this vaccine but patient still declined. Advised may receive this vaccine at local pharmacy or Health Dept. Aware to provide a copy of the vaccination record if obtained from local pharmacy or Health Dept. Verbalized acceptance and understanding.   Covid-19 vaccine status: Declined, Education has been provided regarding the importance of this vaccine but patient still declined. Advised may receive this vaccine at local pharmacy or Health Dept.or vaccine clinic. Aware to provide a copy of the vaccination record if obtained from local pharmacy or Health Dept. Verbalized acceptance and understanding.  Qualifies for Shingles Vaccine? Yes   Zostavax completed No   Shingrix Completed?: No.    Education has been provided regarding the importance of this vaccine. Patient has been advised to call insurance company to determine out of pocket expense if they have not yet received this vaccine. Advised may also receive vaccine at local pharmacy or Health Dept. Verbalized acceptance and understanding.  Screening Tests Health Maintenance  Topic Date Due   COVID-19 Vaccine (1) Never done   Zoster Vaccines- Shingrix (1 of 2) Never done   Pneumonia Vaccine 81+ Years old (1 of 1 - PCV) Never done   DTaP/Tdap/Td (2 - Td or Tdap) 11/13/2021   INFLUENZA VACCINE  Never done   Medicare Annual Wellness (AWV)  08/17/2023   MAMMOGRAM  09/21/2023   PAP SMEAR-Modifier  06/21/2025   COLONOSCOPY (Pts 45-80yr Insurance coverage will need to be confirmed)  09/06/2031   DEXA SCAN  Completed   Hepatitis C Screening  Completed   HIV Screening  Completed   HPV VACCINES  Aged Out    Health Maintenance  Health  Maintenance Due  Topic Date Due   COVID-19 Vaccine (1) Never done   Zoster Vaccines- Shingrix (1 of 2) Never done   Pneumonia Vaccine 74+ Years old (1 of 1 - PCV) Never done   DTaP/Tdap/Td (2 - Td or Tdap) 11/13/2021   INFLUENZA VACCINE  Never done    Colorectal cancer screening: Type of screening: Colonoscopy. Completed yes. Repeat every 5 years  Mammogram status: Ordered yes. Pt provided with contact info and advised to call to schedule appt.   Bone Density status: Completed yes. Results reflect: Bone density results: NORMAL. Repeat every 5 years.  Lung Cancer Screening: (Low Dose CT Chest recommended if Age 52-80 years, 30 pack-year currently smoking OR have quit w/in 15years.) does not qualify.   Lung Cancer Screening Referral: no  Additional Screening:  Hepatitis C Screening: does not qualify; Completed yes  Vision Screening: Recommended annual ophthalmology exams for early detection of glaucoma and other disorders of the eye. Is the patient up to date with their annual eye exam?  No  Who is the provider or what is the name of the office in which the patient attends annual eye exams? [facility]..will call when ready If pt is not established with a provider, would they like to be referred to a provider to establish care? No .   Dental Screening: Recommended annual dental exams for proper oral hygiene  Community Resource Referral / Chronic Care Management: CRR required this visit?  Yes   CCM required this visit?  No      Plan:     I have personally reviewed and noted the following in the patient's chart:   Medical and social history Use of alcohol, tobacco or illicit drugs  Current medications and supplements including opioid prescriptions. Patient is currently taking opioid prescriptions. Information provided to patient regarding non-opioid alternatives. Patient advised to discuss non-opioid treatment plan with their provider. Functional ability and  status Nutritional status Physical activity Advanced directives List of other physicians Hospitalizations, surgeries, and ER visits in previous 12 months Vitals Screenings to include cognitive, depression, and falls Referrals and appointments  In addition, I have reviewed and discussed with patient certain preventive protocols, quality metrics, and best practice recommendations. A written personalized care plan for preventive services as well as general preventive health recommendations were provided to patient.     Roger Shelter, LPN   [id number]   Nurse Notes: Visit with pt is monopolized in conversation by pts physical symptoms her pain Pt relays she is in agonizing intermittent pain in her abdomen in which she has went for diagnosis several visits with no resolve. Pt relays she is isolates at home other than going to appts as she refers to herself as "bedridden". She relays she only hears from her daughter once monthly and has no other social outlets.  Pt sts she is unable to do things around her house and care for herself as well due to pain. Referral made for assessment via SW.

## 2022-08-17 ENCOUNTER — Other Ambulatory Visit: Payer: Self-pay | Admitting: Nurse Practitioner

## 2022-08-17 DIAGNOSIS — Z604 Social exclusion and rejection: Secondary | ICD-10-CM

## 2022-08-20 ENCOUNTER — Telehealth: Payer: Self-pay | Admitting: *Deleted

## 2022-08-20 NOTE — Progress Notes (Unsigned)
  Care Coordination  Outreach Note  08/20/2022 Name: KEKELI ORTMAN MRN: [id number] DOB: [birth year]   Care Coordination Outreach Attempts: outreach to schedule with clinician in response to a referral   Follow Up Plan:  Additional outreach attempts will be made    Encounter Outcome:  No Answer  [place], [place] Direct Dial: [phone]

## 2022-08-22 ENCOUNTER — Ambulatory Visit (INDEPENDENT_AMBULATORY_CARE_PROVIDER_SITE_OTHER): Payer: Medicare HMO

## 2022-08-22 ENCOUNTER — Ambulatory Visit (INDEPENDENT_AMBULATORY_CARE_PROVIDER_SITE_OTHER): Payer: Medicare HMO | Admitting: Internal Medicine

## 2022-08-22 VITALS — BP 120/68 | HR 81 | Temp 98.1°F | Ht 62.0 in | Wt 187.0 lb

## 2022-08-22 DIAGNOSIS — M7630 Iliotibial band syndrome, unspecified leg: Secondary | ICD-10-CM | POA: Insufficient documentation

## 2022-08-22 DIAGNOSIS — M7632 Iliotibial band syndrome, left leg: Secondary | ICD-10-CM

## 2022-08-22 DIAGNOSIS — E559 Vitamin D deficiency, unspecified: Secondary | ICD-10-CM

## 2022-08-22 DIAGNOSIS — M1712 Unilateral primary osteoarthritis, left knee: Secondary | ICD-10-CM | POA: Diagnosis not present

## 2022-08-22 MED ORDER — TRAMADOL HCL 50 MG PO TABS: 50.0000 mg | ORAL_TABLET | Freq: Four times a day (QID) | ORAL | 0 refills | Status: DC | PRN

## 2022-08-22 NOTE — Progress Notes (Signed)
Patient ID: Tracy Larson, female   DOB: [birth year], 67 y.o.   MRN: [id number]        Chief Complaint: follow up left lateral knee pain       HPI:  Tracy Larson is a 67 y.o. female here with c/o new onest left lateral knee pain, moderate, worse to walk for several wks, but little swelling, no giveaways or falls.  Pt denies chest pain, increased sob or doe, wheezing, orthopnea, PND, increased LE swelling, palpitations, dizziness or syncope.   Pt denies polydipsia, polyuria, or new focal neuro s/s.    Pt denies fever, wt loss, night sweats, loss of appetite, or other constitutional symptoms          Wt Readings from Last 3 Encounters:  08/22/22 187 lb (84.8 kg)  08/16/22 189 lb (85.7 kg)  06/29/22 190 lb 6 oz (86.4 kg)   BP Readings from Last 3 Encounters:  08/22/22 120/68  06/29/22 110/76  06/21/22 116/75         Past Medical History:  Diagnosis Date   Adrenal hyperplasia ([place])    Multiple gastric ulcers    Shingles    "internal"   Past Surgical History:  Procedure Laterality Date   [place]    reports that she has never smoked. She has never used smokeless tobacco. She reports that she does not drink alcohol and does not use drugs. family history is not on file. Allergies  Allergen Reactions   Other Anaphylaxis    Iodine; has tolerated in the past   Current Outpatient Medications on File Prior to Visit  Medication Sig Dispense Refill   cyclobenzaprine (FLEXERIL) 5 MG tablet Take 1 tablet (5 mg total) by mouth 2 (two) times daily as needed for muscle spasms. 20 tablet 1   HYDROcodone-acetaminophen (NORCO/VICODIN) 5-325 MG tablet Take 1 tablet by mouth every 8 (eight) hours as needed for severe pain. 9 tablet 0   hydrocortisone (CORTEF) 10 MG tablet TAKE 3 TABLETS BY MOUTH DAILY IN AM AND 3 TABLET IN  PM 540 tablet 3   hydrocortisone sodium succinate (SOLU-CORTEF) 100 MG injection Inject 100 mg as needed for adrenal crisis 10 each prn    lidocaine (LIDODERM) 5 % Place 1 patch onto the skin daily. Remove & Discard patch within 12 hours or as directed by MD 30 patch 0   Misc Natural Products (GINSENG COMPLEX PO) Take 2 tablets by mouth daily. 500 mg per patient     naproxen (NAPROSYN) 500 MG tablet Take 1 tablet (500 mg total) by mouth 2 (two) times daily. 30 tablet 0   No current facility-administered medications on file prior to visit.        ROS:  All others reviewed and negative.  Objective        PE:  BP 120/68 (BP Location: Right Arm, Patient Position: Sitting, Cuff Size: Large)   Pulse 81   Temp 98.1 F (36.7 C) (Oral)   Ht '5\' 2"'$  (1.575 m)   Wt 187 lb (84.8 kg)   SpO2 97%   BMI 34.20 kg/m                 Constitutional: Pt appears in NAD               HENT: Head: NCAT.                Right Ear: External ear normal.  Left Ear: External ear normal.                Eyes: . Pupils are equal, round, and reactive to light. Conjunctivae and EOM are normal               Nose: without d/c or deformity               Neck: Neck supple. Gross normal ROM               Cardiovascular: Normal rate and regular rhythm.                 Pulmonary/Chest: Effort normal and breath sounds without rales or wheezing.                Abd:  Soft, NT, ND, + BS, no organomegaly               Neurological: Pt is alert. At baseline orientation, motor grossly intact               Skin: Skin is warm. No rashes, no other new lesions, LE edema - none; has tender over iliotibial band and insertion site               Psychiatric: Pt behavior is normal without agitation   Micro: none  Cardiac tracings I have personally interpreted today:  none  Pertinent Radiological findings (summarize): none   Lab Results  Component Value Date   WBC 7.2 06/06/2022   HGB 14.4 06/06/2022   HCT 44.5 06/06/2022   PLT 268 06/06/2022   GLUCOSE 98 06/06/2022   CHOL 194 04/19/2022   TRIG 128.0 04/19/2022   HDL 57.30 04/19/2022   LDLCALC  111 (H) 04/19/2022   ALT 10 06/06/2022   AST 21 06/06/2022   NA 139 06/06/2022   K 3.8 06/06/2022   CL 105 06/06/2022   CREATININE 0.73 06/06/2022   BUN 11 06/06/2022   CO2 22 06/06/2022   TSH 1.80 04/19/2022   INR 1.05 03/22/2016   HGBA1C 5.4 04/19/2022   Assessment/Plan:  Tracy Larson is a 67 y.o. Black or African American [2] female with  has a past medical history of Adrenal hyperplasia ([place]), Multiple gastric ulcers, and Shingles.  Iliotibial band syndrome D/w pt, for left knee xray but suspect iliotibial band syndrome, for sport med referral and tramadol prn,  to f/u any worsening symptoms or concerns  Vitamin D deficiency Last vitamin D Lab Results  Component Value Date   VD25OH 27.34 (L) 03/31/2020   Low, to start oral replacement  Followup: Return if symptoms worsen or fail to improve.  Tracy Cower, MD 08/26/2022 8:38 AM [facility]

## 2022-08-22 NOTE — Patient Instructions (Signed)
Please take all new medication as prescribed - the tramadol for pain  Please continue all other medications as before, and refills have been done if requested.  Please have the pharmacy call with any other refills you may need.  Please keep your appointments with your specialists as you may have planned  You will be contacted regarding the referral for: Sports Medicine  Please go to the XRAY Department in the first floor for the x-ray testing  You will be contacted by phone if any changes need to be made immediately.  Otherwise, you will receive a letter about your results with an explanation, but please check with MyChart first.  Please remember to sign up for MyChart if you have not done so, as this will be important to you in the future with finding out test results, communicating by private email, and scheduling acute appointments online when needed.

## 2022-08-23 ENCOUNTER — Telehealth: Payer: Self-pay

## 2022-08-23 NOTE — Progress Notes (Signed)
Pt says she has already had consultation with social worker last week and needs addressed, closing referral as complete.  Julian Hy, [place] Direct Dial: [phone]

## 2022-08-23 NOTE — Telephone Encounter (Signed)
Please give pt a call back as she has stated she was informed that if she had anymore questions or concerns to give you a call. Pt has stated she is needing to speak with you about her financial situation as she was not in a good head space yesterday to do so.  Please call pt back at [phone].

## 2022-08-24 NOTE — Progress Notes (Signed)
  Care Coordination   Note   08/24/2022 Name: Tracy Larson MRN: [id number] DOB: 20-Jun-1956  Tracy Larson is a 67 y.o. year old female who sees Ailene Ards, NP for primary care. I reached out to [name] by phone today in response to a referral  Follow up plan:  Telephone appointment with team member scheduled for:  08/27/2022  Encounter Outcome:  Pt. Scheduled  Julian Hy, [place] Direct Dial: [phone]

## 2022-08-26 ENCOUNTER — Encounter: Payer: Self-pay | Admitting: Internal Medicine

## 2022-08-26 NOTE — Assessment & Plan Note (Signed)
D/w pt, for left knee xray but suspect iliotibial band syndrome, for sport med referral and tramadol prn,  to f/u any worsening symptoms or concerns

## 2022-08-26 NOTE — Assessment & Plan Note (Signed)
Last vitamin D Lab Results  Component Value Date   VD25OH 27.34 (L) 03/31/2020   Low, to start oral replacement

## 2022-08-27 ENCOUNTER — Ambulatory Visit: Payer: Self-pay | Admitting: Licensed Clinical Social Worker

## 2022-08-27 NOTE — Patient Instructions (Signed)
Visit Information  Thank you for taking time to visit with me today. Please don't hesitate to contact me if I can be of assistance to you.   Following are the goals we discussed today:   Goals Addressed             This Visit's Progress    COMPLETED: Connect with Resources in the community       Activities and task to complete in order to accomplish goals.   Call your insurance provider for more information about your Enhanced Benefits (to see if they offer transportation ) Call the Senior Transportation information e-mail to follow up on transportation options Follow up on food resources discussed (see list that was e-mailed) Find out more about the Aid and Attendance program to see if you qualify [phone] I have placed a referral with Meals on Wheels, you understand this is a long wait list, they will reach out to you. Feel free to follow up with them at [place] 211: Confidential 24/7/365  with Multilingual for all community resources. Three ways to obtain information: Dial 211 [phone]  Search online: [email or web address]it           If you are experiencing a Mental Health or [place] or need someone to talk to, please call the Suicide and Crisis Lifeline: 988 call the [place] National Suicide Prevention Lifeline: 434-428-2461 or TTY: 307-644-2818 TTY 269-030-1929) to talk to a trained counselor call 1-800-273-TALK (toll free, 24 hour hotline) go to [facility] [address] [place] [phone])   Patient verbalizes understanding of instructions and care plan provided today and agrees to view in [place]. Active MyChart status and patient understanding of how to access instructions and care plan via MyChart confirmed with patient.     No further follow up required: Patient is not eligible for long term management services provided by the Care Coordination team  Casimer Lanius, [place] [phone]

## 2022-08-27 NOTE — Patient Outreach (Signed)
  Care Coordination  Initial Visit Note   08/27/2022 Name: EVANNIE EISELE MRN: [id number] DOB: [birth year]  MALIKA GREIFF is a 67 y.o. year old female who sees Ailene Ards, NP for primary care. I spoke with  [name] by phone today.  What matters to the patients health and wellness today?  Community Resources    Goals Addressed             This Visit's Progress    COMPLETED: Connect with Resources in the community       Activities and task to complete in order to accomplish goals.   Call your insurance provider for more information about your Enhanced Benefits (to see if they offer transportation ) Call the Senior Transportation information e-mail to follow up on transportation options Follow up on food resources discussed (see list that was e-mailed) Find out more about the Aid and Attendance program to see if you qualify [phone] I have placed a referral with [employer] Whe[employer], you understand this is a long wait list, they will reach out to you. Feel free to follow up with them at [place] [facility]: Confidential 24/7/365  with Multilingual for all community resources. Three ways to obtain information: Dial 211 [phone]  Search online: [email or web address]           SDOH assessments and interventions completed:  Yes  SDOH Interventions Today    Flowsheet Row Most Recent Value  SDOH Interventions   Food Insecurity Interventions Other (Comment), [email or web address] Referral  [provided food options in the community & referral to meals on wheels]  Housing Interventions Intervention Not Indicated  Transportation Interventions Payor Benefit, Other (Comment)  [discussed and provided options]        Care Coordination Interventions:  Yes, provided  Interventions Today    Flowsheet Row Most Recent Value  Chronic Disease   Chronic disease during today's visit Other  [Congenital adreanal Hyperplasia]  General Interventions    General Interventions Discussed/Reviewed General Interventions Discussed, Radio producer Interventions   Education Provided Provided Education, Provided Web-based Education  Provided Verbal Education On [employer], [employer] Resources       Follow up plan: No further intervention required. Patient is not eligible for long term management services provided by the Care Coordination team  Encounter Outcome:  Pt. Visit Completed   Casimer Lanius, [place] [phone]

## 2022-08-28 NOTE — Patient Instructions (Addendum)
Tracy Larson , Thank you for taking time to come for your Medicare Wellness Visit. I appreciate your ongoing commitment to your health goals. Please review the following plan we discussed and let me know if I can assist you in the future.   These are the goals we discussed on 08/17/2023  Goals   None     This is a list of the screening recommended for you and due dates:  Health Maintenance  Topic Date Due   COVID-19 Vaccine (1) Never done   Zoster (Shingles) Vaccine (1 of 2) Never done   Pneumonia Vaccine (1 of 1 - PCV) Never done   DTaP/Tdap/Td vaccine (2 - Td or Tdap) 11/13/2021   Flu Shot  Never done   Medicare Annual Wellness Visit  08/17/2023   Mammogram  09/21/2023   Pap Smear  06/21/2025   Colon Cancer Screening  09/06/2031   DEXA scan (bone density measurement)  Completed   Hepatitis C Screening: USPSTF Recommendation to screen - Ages 78-79 yo.  Completed   HIV Screening  Completed   HPV Vaccine  Aged Out    Advanced directives: no  Conditions/risks identified: falls risk  Next appointment: Follow up in one year for your annual wellness visit 09/03/2023 '@9am'$  telephone   Preventive Care 65 Years and Older, Female Preventive care refers to lifestyle choices and visits with your health care provider that can promote health and wellness. What does preventive care include? A yearly physical exam. This is also called an annual well check. Dental exams once or twice a year. Routine eye exams. Ask your health care provider how often you should have your eyes checked. Personal lifestyle choices, including: Daily care of your teeth and gums. Regular physical activity. Eating a healthy diet. Avoiding tobacco and drug use. Limiting alcohol use. Practicing safe sex. Taking low-dose aspirin every day. Taking vitamin and mineral supplements as recommended by your health care provider. What happens during an annual well check? The services and screenings done by your health  care provider during your annual well check will depend on your age, overall health, lifestyle risk factors, and family history of disease. Counseling  Your health care provider may ask you questions about your: Alcohol use. Tobacco use. Drug use. Emotional well-being. Home and relationship well-being. Sexual activity. Eating habits. History of falls. Memory and ability to understand (cognition). Work and work Statistician. Reproductive health. Screening  You may have the following tests or measurements: Height, weight, and BMI. Blood pressure. Lipid and cholesterol levels. These may be checked every 5 years, or more frequently if you are over 34 years old. Skin check. Lung cancer screening. You may have this screening every year starting at age 107 if you have a 30-pack-year history of smoking and currently smoke or have quit within the past 15 years. Fecal occult blood test (FOBT) of the stool. You may have this test every year starting at age 59. Flexible sigmoidoscopy or colonoscopy. You may have a sigmoidoscopy every 5 years or a colonoscopy every 10 years starting at age 58. Hepatitis C blood test. Hepatitis B blood test. Sexually transmitted disease (STD) testing. Diabetes screening. This is done by checking your blood sugar (glucose) after you have not eaten for a while (fasting). You may have this done every 1-3 years. Bone density scan. This is done to screen for osteoporosis. You may have this done starting at age 65. Mammogram. This may be done every 1-2 years. Talk to your health care provider about  how often you should have regular mammograms. Talk with your health care provider about your test results, treatment options, and if necessary, the need for more tests. Vaccines  Your health care provider may recommend certain vaccines, such as: Influenza vaccine. This is recommended every year. Tetanus, diphtheria, and acellular pertussis (Tdap, Td) vaccine. You may need a Td  booster every 10 years. Zoster vaccine. You may need this after age 41. Pneumococcal 13-valent conjugate (PCV13) vaccine. One dose is recommended after age 44. Pneumococcal polysaccharide (PPSV23) vaccine. One dose is recommended after age 79. Talk to your health care provider about which screenings and vaccines you need and how often you need them. This information is not intended to replace advice given to you by your health care provider. Make sure you discuss any questions you have with your health care provider. Document Released: 07/15/2015 Document Revised: 03/07/2016 Document Reviewed: 04/19/2015 Elsevier Interactive Patient Education  2017 [employer] Prevention in the Home Falls can cause injuries. They can happen to people of all ages. There are many things you can do to make your home safe and to help prevent falls. What can I do on the outside of my home? Regularly fix the edges of walkways and driveways and fix any cracks. Remove anything that might make you trip as you walk through a door, such as a raised step or threshold. Trim any bushes or trees on the path to your home. Use bright outdoor lighting. Clear any walking paths of anything that might make someone trip, such as rocks or tools. Regularly check to see if handrails are loose or broken. Make sure that both sides of any steps have handrails. Any raised decks and porches should have guardrails on the edges. Have any leaves, snow, or ice cleared regularly. Use sand or salt on walking paths during winter. Clean up any spills in your garage right away. This includes oil or grease spills. What can I do in the bathroom? Use night lights. Install grab bars by the toilet and in the tub and shower. Do not use towel bars as grab bars. Use non-skid mats or decals in the tub or shower. If you need to sit down in the shower, use a plastic, non-slip stool. Keep the floor dry. Clean up any water that spills on the floor  as soon as it happens. Remove soap buildup in the tub or shower regularly. Attach bath mats securely with double-sided non-slip rug tape. Do not have throw rugs and other things on the floor that can make you trip. What can I do in the bedroom? Use night lights. Make sure that you have a light by your bed that is easy to reach. Do not use any sheets or blankets that are too big for your bed. They should not hang down onto the floor. Have a firm chair that has side arms. You can use this for support while you get dressed. Do not have throw rugs and other things on the floor that can make you trip. What can I do in the kitchen? Clean up any spills right away. Avoid walking on wet floors. Keep items that you use a lot in easy-to-reach places. If you need to reach something above you, use a strong step stool that has a grab bar. Keep electrical cords out of the way. Do not use floor polish or wax that makes floors slippery. If you must use wax, use non-skid floor wax. Do not have throw rugs and other  things on the floor that can make you trip. What can I do with my stairs? Do not leave any items on the stairs. Make sure that there are handrails on both sides of the stairs and use them. Fix handrails that are broken or loose. Make sure that handrails are as long as the stairways. Check any carpeting to make sure that it is firmly attached to the stairs. Fix any carpet that is loose or worn. Avoid having throw rugs at the top or bottom of the stairs. If you do have throw rugs, attach them to the floor with carpet tape. Make sure that you have a light switch at the top of the stairs and the bottom of the stairs. If you do not have them, ask someone to add them for you. What else can I do to help prevent falls? Wear shoes that: Do not have high heels. Have rubber bottoms. Are comfortable and fit you well. Are closed at the toe. Do not wear sandals. If you use a stepladder: Make sure that it is  fully opened. Do not climb a closed stepladder. Make sure that both sides of the stepladder are locked into place. Ask someone to hold it for you, if possible. Clearly mark and make sure that you can see: Any grab bars or handrails. First and last steps. Where the edge of each step is. Use tools that help you move around (mobility aids) if they are needed. These include: Canes. Walkers. Scooters. Crutches. Turn on the lights when you go into a dark area. Replace any light bulbs as soon as they burn out. Set up your furniture so you have a clear path. Avoid moving your furniture around. If any of your floors are uneven, fix them. If there are any pets around you, be aware of where they are. Review your medicines with your doctor. Some medicines can make you feel dizzy. This can increase your chance of falling. Ask your doctor what other things that you can do to help prevent falls. This information is not intended to replace advice given to you by your health care provider. Make sure you discuss any questions you have with your health care provider. Document Released: 04/14/2009 Document Revised: 11/24/2015 Document Reviewed: 07/23/2014 Elsevier Interactive Patient Education  2017 [employer].

## 2022-09-03 ENCOUNTER — Encounter: Payer: Self-pay | Admitting: Family Medicine

## 2022-09-03 ENCOUNTER — Ambulatory Visit (INDEPENDENT_AMBULATORY_CARE_PROVIDER_SITE_OTHER): Payer: Medicare HMO | Admitting: Family Medicine

## 2022-09-03 ENCOUNTER — Ambulatory Visit: Payer: Self-pay

## 2022-09-03 VITALS — BP 110/74 | HR 82 | Ht 62.0 in | Wt 187.6 lb

## 2022-09-03 DIAGNOSIS — G8929 Other chronic pain: Secondary | ICD-10-CM

## 2022-09-03 DIAGNOSIS — M25551 Pain in right hip: Secondary | ICD-10-CM

## 2022-09-03 DIAGNOSIS — M25562 Pain in left knee: Secondary | ICD-10-CM

## 2022-09-03 MED ORDER — PREDNISONE 50 MG PO TABS: 50.0000 mg | ORAL_TABLET | Freq: Every day | ORAL | 0 refills | Status: DC

## 2022-09-03 NOTE — Patient Instructions (Addendum)
Thank you for coming in today.   I've referred you to Physical Therapy.  Let us know if you don't hear from them in one week.   Return in 1 month.   Let me know if this is not working..   Take the the prednisone for 5 days.

## 2022-09-03 NOTE — Progress Notes (Signed)
I, Tracy Larson, CMA acting as a Education administrator for Tracy Leader, MD.  Subjective:    CC: L knee pain  HPI: Pt is a 67 y/o female c/o L knee pain ongoing for several weeks. Pt locates pain to the peri-patellar region and lateral aspect, sx flare up when kneeling onto the knees. Sx present x 1 month. Feels like a nail coming through the knee. Denies swelling. Denies mechanical sx. No pain in the knee except for when kneeling.   L Knee swelling: no Mechanical symptoms: no Aggravates: kneeling Treatments tried: avoiding kneeling.   Additionally she notes persistent right hip pain.  This has been ongoing for months.  Pain is located at the anterior and lateral aspect of the hip.  It is very tender at times.  She is using a cane to ambulate.  She had a CT scan of her abdomen and pelvis dedicated for kidney stones that did not show kidney stones.  This was performed back in December.  Dx imaging: 08/22/22 L knee XR  Pertinent review of Systems: No fevers or chills  Relevant historical information: Adrenal insufficiency   Objective:    Vitals:   09/03/22 0815  BP: 110/74  Pulse: 82  SpO2: 99%   General: Well Developed, well nourished, and in no acute distress.   MSK: Left knee: Normal-appearing Normal motion. Nontender. Stable ligamentous exam. Intact strength. Negative McMurray's test. Pain is present at the superior lateral knee with knee flexion unloaded knee.  This is at the IT band area.  Right hip: Normal-appearing Tender palpation greater trochanter with very light touch.  This was hard to reproduce.  Normal hip motion.  Intact strength.  Lab and Radiology Results  Diagnostic Limited MSK Ultrasound of: Left knee Quad tendon intact normal-appearing.  Mild joint effusion Patellar tendon normal-appearing Medial joint line degenerative appearing Lateral joint line normal. Posterior knee no Baker's cyst. Impression: Medial DJD   EXAM: LEFT KNEE - COMPLETE 4+ VIEW    COMPARISON:  None available   FINDINGS: Moderate medial coronal joint space narrowing and peripheral osteophytosis. Mild patellofemoral joint space narrowing and moderate peripheral osteophytosis. Moderate posterior femoral condyle degenerative osteophytes. No joint effusion. No acute fracture or dislocation.   IMPRESSION: Moderate medial compartment and mild to moderate patellofemoral compartment osteoarthritis.     Electronically Signed   By: Yvonne Kendall M.D.   On: 08/24/2022 18:24   I, Tracy Larson, personally (independently) visualized and performed the interpretation of the images attached in this note.   CT scan images of the right hip visible on CT scan abdomen and pelvis from June 06, 2022 personally and independently interpreted. No severe or significant hip DJD.  No abnormalities at the greater trochanter.  Impression and Recommendations:    Assessment and Plan: 67 y.o. female with left knee pain.  She has a small joint effusion and may experience pressure of the IT band area when she flexes her knee and loads it.. Plan for trial of physical therapy.  Recheck in 1 month.  Prednisone prescribed for the hip pain may be helpful for this issue as well.  Right lateral hip pain.  This is a chronic ongoing issue.  I am not quite sure why she hurts is severely and intermittently as she does.  She had a good CT scan of her abdomen and pelvis looking for kidney stones in December that does not explain her pain.  She may simply have tendinopathy in this area or may have a more rare  condition such as complex regional pain syndrome.  The pain seems to be out of proportion to her imaging and only hurts sometimes which is a bit mysterious.  She is a good candidate for trial of physical therapy.  Plan to refer to PT.  Short course of prednisone prescribed.  Recheck in 1 month. We talked about medication safety.  She does have adrenal insufficiency.  Short course of prednisone should be  okay as long as she resumes her hydrocortisone.  Do not take ibuprofen with this prednisone medication.  Orders Placed This Encounter  Procedures   [place] LIMITED JOINT SPACE STRUCTURES LOW LEFT(NO LINKED CHARGES)    Order Specific Question:   Reason for Exam (SYMPTOM  OR DIAGNOSIS REQUIRED)    Answer:   left knee pain    Order Specific Question:   Preferred imaging location?    Answer:   [facility]   Ambulatory referral to Physical Therapy    Referral Priority:   Routine    Referral Type:   Physical Medicine    Referral Reason:   Specialty Services Required    Requested Specialty:   Physical Therapy   Meds ordered this encounter  Medications   predniSONE (DELTASONE) 50 MG tablet    Sig: Take 1 tablet (50 mg total) by mouth daily.    Dispense:  5 tablet    Refill:  0    Discussed warning signs or symptoms. Please see discharge instructions. Patient expresses understanding.   The above documentation has been reviewed and is accurate and complete Tracy Larson, M.D.

## 2022-09-06 DIAGNOSIS — R200 Anesthesia of skin: Secondary | ICD-10-CM | POA: Diagnosis not present

## 2022-09-06 DIAGNOSIS — M549 Dorsalgia, unspecified: Secondary | ICD-10-CM | POA: Diagnosis not present

## 2022-09-06 DIAGNOSIS — R202 Paresthesia of skin: Secondary | ICD-10-CM | POA: Diagnosis not present

## 2022-09-06 DIAGNOSIS — M545 Low back pain, unspecified: Secondary | ICD-10-CM | POA: Diagnosis not present

## 2022-09-06 DIAGNOSIS — M4316 Spondylolisthesis, lumbar region: Secondary | ICD-10-CM | POA: Diagnosis not present

## 2022-09-11 ENCOUNTER — Telehealth: Payer: Self-pay | Admitting: Family Medicine

## 2022-09-11 DIAGNOSIS — M4316 Spondylolisthesis, lumbar region: Secondary | ICD-10-CM | POA: Diagnosis not present

## 2022-09-11 DIAGNOSIS — Z6834 Body mass index (BMI) 34.0-34.9, adult: Secondary | ICD-10-CM | POA: Diagnosis not present

## 2022-09-11 NOTE — Telephone Encounter (Signed)
Tracy Larson called asking if Dr Georgina Snell would be willing to refill her predniSONE (DELTASONE) 50 MG tablet.  She said that it has greatly helped her hip pain.  Please advise.

## 2022-09-11 NOTE — Telephone Encounter (Signed)
Forwarding to Dr. Georgina Snell to advise if OK to refill  Last OV 09/03/22 Last refill 09/03/22 #5/0

## 2022-09-13 ENCOUNTER — Other Ambulatory Visit: Payer: Self-pay | Admitting: Family Medicine

## 2022-09-13 MED ORDER — PREDNISONE 50 MG PO TABS: 50.0000 mg | ORAL_TABLET | Freq: Every day | ORAL | 0 refills | Status: DC

## 2022-09-13 NOTE — Telephone Encounter (Signed)
Prednisone refilled.  Please attend physical therapy as scheduled.  We cannot keep refilling prednisone as it is not safe for long-term use.

## 2022-09-13 NOTE — Telephone Encounter (Signed)
Pt called, read MyChart message to patient. Pt understood but seems concerned about discontinuing the steroids in the future. Physical therapy to start 09/27/2022.

## 2022-09-18 NOTE — Telephone Encounter (Signed)
I am declining the refill of prednisone.  She needs to attend physical therapy.  This medication is not safe to continue to refill.  This will be the third prescription of this medication in 1 month.

## 2022-09-27 ENCOUNTER — Encounter: Payer: Self-pay | Admitting: Physical Therapy

## 2022-09-27 ENCOUNTER — Ambulatory Visit: Payer: Medicare HMO | Attending: Family Medicine | Admitting: Physical Therapy

## 2022-09-27 ENCOUNTER — Other Ambulatory Visit: Payer: Self-pay

## 2022-09-27 DIAGNOSIS — M5459 Other low back pain: Secondary | ICD-10-CM | POA: Diagnosis not present

## 2022-09-27 DIAGNOSIS — M6281 Muscle weakness (generalized): Secondary | ICD-10-CM | POA: Diagnosis not present

## 2022-09-27 DIAGNOSIS — M25562 Pain in left knee: Secondary | ICD-10-CM | POA: Insufficient documentation

## 2022-09-27 NOTE — Therapy (Signed)
OUTPATIENT PHYSICAL THERAPY THORACOLUMBAR EVALUATION  Patient Name: Tracy Larson MRN: [id number] DOB:1956-02-21, 67 y.o., female Today's Date: 09/27/2022   PT End of Session - 09/27/22 0927     Visit Number 1    Number of Visits --   1-2x/week   Date for PT Re-Evaluation 11/22/22    Authorization Type aetna MCR - FOTO    PT Start Time 0730    PT Stop Time 0830    PT Time Calculation (min) 60 min             Past Medical History:  Diagnosis Date   Adrenal hyperplasia ([place])    Multiple gastric ulcers    Shingles    "internal"   Past Surgical History:  Procedure Laterality Date   [place]   Patient Active Problem List   Diagnosis Date Noted   Iliotibial band syndrome 08/22/2022   Acute low back pain 06/29/2022   Barrett's esophagus 05/17/2022   Chronic RLQ pain 04/19/2022   Class 2 obesity without serious comorbidity with body mass index (BMI) of 35.0 to 35.9 in adult 04/19/2022   UTI (urinary tract infection) 01/12/2022   Pelvic pain in female 01/12/2022   Kidney lesion, native, right 01/12/2022   Right lower quadrant abdominal pain 11/30/2021   Adrenal insufficiency ([place]) 07/29/2020   Chest pain 08/17/2017   Vitamin D deficiency 02/14/2016   Long term current use of systemic steroids 02/10/2015   Osteopenia 02/10/2015   Notalgia    Bilateral thoracic back pain    Absence of bladder continence    Fecal incontinence    Congenital adrenal hyperplasia ([place])    Back pain 11/27/2014   Morbid obesity ([place]) 11/08/2014   Arthritis 06/04/2014    PCP: Ailene Ards, NP  REFERRING PROVIDER: Gregor Hams, MD  THERAPY DIAG:  Other low back pain  Muscle weakness  Left knee pain, unspecified chronicity  REFERRING DIAG: Spondylolisthesis, lumbar region [M43.16]   Rationale for Evaluation and Treatment:  Rehabilitation  SUBJECTIVE:  PERTINENT PAST HISTORY:  Morbid obesity, fecal incontinence, long term use of steroids  (d/t adrenal hyperplasia)       PRECAUTIONS: spondylolisthesis L4-L5 (but likely fused per spine MD)   WEIGHT BEARING RESTRICTIONS No  FALLS:  Has patient fallen in last 6 months? No, Number of falls: 0  MOI/History of condition:  Onset date: 2 years  SUBJECTIVE STATEMENT  Tracy Larson is a 67 y.o. female who presents to clinic with chief complaint of R lower abdominal pain.  This pain started around the time she has shingles.  She has been to GI and had several MRIs of the abdomin (per pt) but there has been no significant finding that explain her pain.  She feels that the abdominal pain causes R hip pain and low back pain.  She has radiating pain and n/t to toes.  She has sudden onset of "contraction" pain in her abdomin during subjective which takes her breath away.  This is not initiated by movement.  Denies BB changes or saddle anesthesia.     From referring provider:   "HPI: Pt is a 67 y/o female c/o L knee pain ongoing for several weeks. Pt locates pain to the peri-patellar region and lateral aspect, sx flare up when kneeling onto the knees. Sx present x 1 month. Feels like a nail coming through the knee. Denies swelling. Denies mechanical sx. No pain in the knee except for when  kneeling.   "   Red flags:  denies BB changes and saddle anesthesia  Pain:  Are you having pain? Yes Pain location: R lower quadrant of abdomin, R hip R lower back, R leg NPRS scale:  10/10 to 10/10 Aggravating factors: walking, exercising Relieving factors: lumbar traction Pain description: constant Stage: Chronic Stability: getting worse 24 hour pattern: 24 hours "constant"   Occupation: NA  Assistive Device: 2 foot walker with seat  Hand Dominance: NA  Patient Goals/Specific Activities: reduce pain  Patient Specific Functional Scale:  Activity Eval         standing 2         walking 3         Sitting  3                    .266          (Activities rated 0-10/10.  "10"  represents "able to perform at prior level" while "0" represents "unable to perform.")   OBJECTIVE:   DIAGNOSTIC FINDINGS:  MRI lumbar spine 06/2022  IMPRESSION: 1. No acute findings. 2. Stable anterolisthesis of L4 on L5. 3. Multilevel degenerative disc disease and facet arthropathy.  GENERAL OBSERVATION/GAIT:  Intermittent jolts of severe pain in sitting  SENSATION:  Light touch: Deficits L5 R   LE MMT:  MMT Right 09/27/2022 Left 09/27/2022  Hip flexion (L2, L3) 4 3+  Knee extension (L3) 3+ 3*  Knee flexion 4 3+*  Hip abduction    Hip extension    Hip external rotation    Hip internal rotation    Hip adduction    Ankle dorsiflexion (L4)    Ankle plantarflexion (S1)    Ankle inversion    Ankle eversion    Great Toe ext (L5)    Grossly     (Blank rows = not tested, score listed is out of 5 possible points.  N = WNL, D = diminished, C = clear for gross weakness with myotome testing, * = concordant pain with testing)   LUMBAR SPECIAL TESTS:   Slump: L (-), R (++++)    LE ROM:  ROM Right 09/27/2022 Left 09/27/2022  Hip flexion    Hip extension    Hip abduction    Hip adduction    Hip internal rotation    Hip external rotation    Knee flexion    Knee extension    Ankle dorsiflexion    Ankle plantarflexion    Ankle inversion    Ankle eversion      (Blank rows = not tested, N = WNL, * = concordant pain with testing)  Functional Tests  Eval (09/27/2022)    10 m max gait speed: 13'', .77 m/s, AD: Y    30'' STS: 6.5x  UE used? Y    2 MWT: 152 ft                                                  PATIENT SURVEYS:  FOTO 27 -> 46   TODAY'S TREATMENT   Therapeutic Activity - PNE regarding pain vs harm and the bodies alarm system  PATIENT EDUCATION:  POC, diagnosis, prognosis, HEP, and outcome measures.  Pt educated via explanation, demonstration, and handout (HEP).  Pt confirms understanding verbally.   HOME EXERCISE PROGRAM: Access  Code: [email or web address] URL: [email or web address]  Date: 09/27/2022 Prepared by: Shearon Balo  Exercises - Hooklying Isometric Clamshell  - 1 x daily - 7 x weekly - 3 sets - 10 reps - Supine Hip Adduction Isometric with Ball  - 1 x daily - 7 x weekly - 2 sets - 10 reps - 10'' hold  Treatment priorities   Eval (09/27/2022)        Gentle strengthening as tolerated        PNE        aquatics        endurance                  ASSESSMENT:  CLINICAL IMPRESSION: Shaquanta is a 67 y.o. female who presents to clinic with signs and sxs consistent with low back pain with radicular sxs.  She has concurrent R abdominal pain which is somewhat atypical.  MRI reassuring.  Her slump on R cause concordant R abdominal, R hip, and R low back pain.  OBJECTIVE IMPAIRMENTS: Pain, endurance, LE strength, core strength  ACTIVITY LIMITATIONS: walking, standing, sitting  PERSONAL FACTORS: See medical history and pertinent history   REHAB POTENTIAL: Fair chronic severe pain  CLINICAL DECISION MAKING: Stable/uncomplicated  EVALUATION COMPLEXITY: Low   GOALS:   SHORT TERM GOALS: Target date: 10/25/2022  Jackline will be >75% HEP compliant to improve carryover between sessions and facilitate independent management of condition  Evaluation (09/27/2022): ongoing Goal status: INITIAL   LONG TERM GOALS: Target date: 11/22/2022  Shawntal will improve FOTO score to 46 as a proxy for functional improvement  Evaluation/Baseline (09/27/2022): 27 Goal status: INITIAL   2.  Milanie will self report >/= 50% decrease in pain from evaluation   Evaluation/Baseline (09/27/2022): 10/10 constant Goal status: INITIAL   3.  Monye will improve two minute walk test to 220 feet (MCID 40 ft).  Evaluation/Baseline (09/27/2022): 152 ft Goal status: INITIAL   4.  Bryan will improve 87'' STS (MCID 2) to >/= 9x (w/ UE?: y) to show improved LE strength and improved transfers   Evaluation/Baseline  (09/27/2022): 6.5x  w/ UE? y Goal status: INITIAL    PLAN: PT FREQUENCY: 1-2x/week  PT DURATION: 8 weeks (Ending 11/22/2022)  PLANNED INTERVENTIONS: Therapeutic exercises, Aquatic therapy, Therapeutic activity, Neuro Muscular re-education, Gait training, Patient/Family education, Joint mobilization, Dry Needling, Electrical stimulation, Spinal mobilization and/or manipulation, Moist heat, Taping, Vasopneumatic device, Ionotophoresis 4mg /ml Dexamethasone, and Manual therapy     Shearon Balo PT, DPT 09/27/2022, 10:44 AM

## 2022-10-02 ENCOUNTER — Ambulatory Visit: Payer: Medicare HMO | Attending: Family Medicine

## 2022-10-02 DIAGNOSIS — M5459 Other low back pain: Secondary | ICD-10-CM | POA: Diagnosis not present

## 2022-10-02 DIAGNOSIS — M6281 Muscle weakness (generalized): Secondary | ICD-10-CM

## 2022-10-02 NOTE — Therapy (Signed)
OUTPATIENT PHYSICAL THERAPY TREATMENT NOTE   Patient Name: Tracy Larson MRN: [id number] DOB:October 13, 1955, 67 y.o., female Today's Date: 10/02/2022  PCP: Tracy Ards, NP  REFERRING PROVIDER: Ailene Ards, NP  END OF SESSION:   PT End of Session - 10/02/22 0919     Visit Number 2    Date for PT Re-Evaluation 11/22/22    PT Start Time 0830    PT Stop Time 0916    PT Time Calculation (min) 46 min    Activity Tolerance Patient limited by pain    Behavior During Therapy [facility] for tasks assessed/performed             Past Medical History:  Diagnosis Date   Adrenal hyperplasia ([place])    Multiple gastric ulcers    Shingles    "internal"   Past Surgical History:  Procedure Laterality Date   [place]   Patient Active Problem List   Diagnosis Date Noted   Iliotibial band syndrome 08/22/2022   Acute low back pain 06/29/2022   Barrett's esophagus 05/17/2022   Chronic RLQ pain 04/19/2022   Class 2 obesity without serious comorbidity with body mass index (BMI) of 35.0 to 35.9 in adult 04/19/2022   UTI (urinary tract infection) 01/12/2022   Pelvic pain in female 01/12/2022   Kidney lesion, native, right 01/12/2022   Right lower quadrant abdominal pain 11/30/2021   Adrenal insufficiency 07/29/2020   Chest pain 08/17/2017   Vitamin D deficiency 02/14/2016   Long term current use of systemic steroids 02/10/2015   Osteopenia 02/10/2015   Notalgia    Bilateral thoracic back pain    Absence of bladder continence    Fecal incontinence    Congenital adrenal hyperplasia ([place])    Back pain 11/27/2014   Morbid obesity 11/08/2014   Arthritis 06/04/2014    REFERRING DIAG: Spondylolisthesis, lumbar region [M43.16]   THERAPY DIAG:  Other low back pain   Rationale for Evaluation and Treatment Rehabilitation  PERTINENT HISTORY: Morbid obesity, fecal incontinence, long term use of steroids (d/t adrenal hyperplasia)   PRECAUTIONS:  spondylolisthesis L4-L5 (but likely fused per spine MD)  SUBJECTIVE:                                                                                                                                                                                      SUBJECTIVE STATEMENT:  "I was able to do the exercises that he gave me. The belt is the thing that is helping me the most right now."   PAIN:  Are you having pain? Yes: NPRS scale: 10/10 Pain location: low back  Pain description: frequent spasms Aggravating factors: constant Relieving factors: use of back brace   OBJECTIVE:   DIAGNOSTIC FINDINGS:  MRI lumbar spine 06/2022  IMPRESSION: 1. No acute findings. 2. Stable anterolisthesis of L4 on L5. 3. Multilevel degenerative disc disease and facet arthropathy.  GENERAL OBSERVATION/GAIT:  Intermittent jolts of severe pain in sitting  SENSATION:  Light touch: Deficits L5 R 10/02/22: Patient is extremely TTP along R hip and R lumbar spine.    LE MMT:  MMT Right 09/27/22 Left 09/27/22  Hip flexion (L2, L3) 4 3+  Knee extension (L3) 3+ 3*  Knee flexion 4 3+*  Hip abduction    Hip extension    Hip external rotation    Hip internal rotation    Hip adduction    Ankle dorsiflexion (L4)    Ankle plantarflexion (S1)    Ankle inversion    Ankle eversion    Great Toe ext (L5)    Grossly     (Blank rows = not tested, score listed is out of 5 possible points.  N = WNL, D = diminished, C = clear for gross weakness with myotome testing, * = concordant pain with testing)   LUMBAR SPECIAL TESTS:   Slump: L (-), R (++++)    LE ROM:  ROM Right 09/27/22 Left 09/27/22  Hip flexion    Hip extension    Hip abduction    Hip adduction    Hip internal rotation    Hip external rotation    Knee flexion    Knee extension    Ankle dorsiflexion    Ankle plantarflexion    Ankle inversion    Ankle eversion      (Blank rows = not tested, N = WNL, * = concordant pain with  testing)  Functional Tests  Eval (10/02/2022)    10 m max gait speed: 13'', .77 m/s, AD: Y    30'' STS: 6.5x  UE used? Y    2 MWT: 152 ft                                                  PATIENT SURVEYS:  FOTO 27 -> 46   [facility] Adult PT Treatment:                                                DATE: 10/02/22  Neuromuscular re-ed: Seated hip abduction/clamshells with theraband (green) x 20 Seated marches with abdominal bracing x 10 Hooklying hip adduction with with abdominal bracing ball squeeze 2 x 10, 3 sec hold  Hooklying abdominal bracing 2 x 10, 5 sec with cueing PNE regarding goal of today's interventions and PT POC to improve lumbar stabilization and desensitization of nervous system  Diaphragmatic breathing between sets for pain modulation.   Provided updated HEP today based on response to treatment.    Treatment 09/27/22  Therapeutic Activity - PNE regarding pain vs harm and the bodies alarm system  PATIENT EDUCATION:  POC, diagnosis, prognosis, HEP, and outcome measures.  Pt educated via explanation, demonstration, and handout (HEP).  Pt confirms understanding verbally.   HOME EXERCISE PROGRAM: Access Code: [email or web address] URL: [email or web address] Date: 10/02/2022 Prepared by: Shelby Dubin  Exercises - Supine Lower Trunk  Rotation  - 1 x daily - 7 x weekly - 2 sets - 10 reps - Supine Hip Adduction Isometric with Ball  - 1 x daily - 7 x weekly - 2 sets - 10 reps - 3 sec hold - Supine Transversus Abdominis Bracing - Hands on Stomach  - 1 x daily - 7 x weekly - 2 sets - 10 reps - Seated Hip Abduction with Resistance  - 1 x daily - 7 x weekly - 2 sets - 10 reps  Treatment priorities   Eval (09/27/22)        Gentle strengthening as tolerated        PNE        aquatics        endurance                  ASSESSMENT:  CLINICAL IMPRESSION: [name] continues to benefit from ongoing pain neuroscience education due to chronicity of symptoms,  irritability of symptoms, and high levels of pain at rest. She was able to perform abdominal bracing activities and hip exercises without any change in symptoms. She benefited from cueing for deep breathing during exercise and to encourage activity pacing. She reports constant 10/10 pain with frequent spasms that did not worsen with exercise today.   OBJECTIVE IMPAIRMENTS: Pain, endurance, LE strength, core strength  ACTIVITY LIMITATIONS: walking, standing, sitting  PERSONAL FACTORS: See medical history and pertinent history   REHAB POTENTIAL: Fair chronic severe pain  CLINICAL DECISION MAKING: Stable/uncomplicated  EVALUATION COMPLEXITY: Low   GOALS:   SHORT TERM GOALS: Target date: 10/25/2022  Deianira will be >75% HEP compliant to improve carryover between sessions and facilitate independent management of condition  Evaluation (09/27/22): ongoing Goal status: INITIAL   LONG TERM GOALS: Target date: 11/22/2022  Nolita will improve FOTO score to 46 as a proxy for functional improvement  Evaluation/Baseline (09/27/22): 27 Goal status: INITIAL   2.  Sarae will self report >/= 50% decrease in pain from evaluation   Evaluation/Baseline (09/27/22): 10/10 constant Goal status: INITIAL   3.  Avia will improve two minute walk test to 220 feet (MCID 40 ft).  Evaluation/Baseline (09/27/22): 152 ft Goal status: INITIAL   4.  Jenice will improve 34'' STS (MCID 2) to >/= 9x (w/ UE?: y) to show improved LE strength and improved transfers   Evaluation/Baseline (09/27/22): 6.5x  w/ UE? y Goal status: INITIAL    PLAN: PT FREQUENCY: 1-2x/week  PT DURATION: 8 weeks (Ending 11/22/2022)  PLANNED INTERVENTIONS: Therapeutic exercises, Aquatic therapy, Therapeutic activity, Neuro Muscular re-education, Gait training, Patient/Family education, Joint mobilization, Dry Needling, Electrical stimulation, Spinal mobilization and/or manipulation, Moist heat, Taping, Vasopneumatic  device, Ionotophoresis 4mg /ml Dexamethasone, and Manual therapy  Plan for next visit: consider incorporation of spinal and LE isometric activities for pain modulation, and LE ergometry.     Shelby Dubin, PT 10/02/2022, 9:37 AM

## 2022-10-08 DIAGNOSIS — M4316 Spondylolisthesis, lumbar region: Secondary | ICD-10-CM | POA: Diagnosis not present

## 2022-10-08 DIAGNOSIS — Z6833 Body mass index (BMI) 33.0-33.9, adult: Secondary | ICD-10-CM | POA: Diagnosis not present

## 2022-10-09 ENCOUNTER — Ambulatory Visit: Payer: Medicare HMO

## 2022-10-09 DIAGNOSIS — M6281 Muscle weakness (generalized): Secondary | ICD-10-CM | POA: Diagnosis not present

## 2022-10-09 DIAGNOSIS — M5459 Other low back pain: Secondary | ICD-10-CM | POA: Diagnosis not present

## 2022-10-09 NOTE — Therapy (Signed)
OUTPATIENT PHYSICAL THERAPY TREATMENT NOTE   Patient Name: Tracy HailJennifer J Miner MRN: [id number] DOB:[birth year], 67 y.o., female Today's Date: 10/09/2022  PCP: Elenore PaddyGray, Sarah E, NP  REFERRING PROVIDER: Elenore PaddyGray, Sarah E, NP  END OF SESSION:     Past Medical History:  Diagnosis Date   Adrenal hyperplasia [facility](HCC)    Multiple gastric ulcers    Shingles    "internal"   Past Surgical History:  Procedure Laterality Date   CESAREAN SECTION     TUBAL LIGATION  1985   Patient Active Problem List   Diagnosis Date Noted   Iliotibial band syndrome 08/22/2022   Acute low back pain 06/29/2022   Barrett's esophagus 05/17/2022   Chronic RLQ pain 04/19/2022   Class 2 obesity without serious comorbidity with body mass index (BMI) of 35.0 to 35.9 in adult 04/19/2022   UTI (urinary tract infection) 01/12/2022   Pelvic pain in female 01/12/2022   Kidney lesion, native, right 01/12/2022   Right lower quadrant abdominal pain 11/30/2021   Adrenal insufficiency 07/29/2020   Chest pain 08/17/2017   Vitamin D deficiency 02/14/2016   Long term current use of systemic steroids 02/10/2015   Osteopenia 02/10/2015   Notalgia    Bilateral thoracic back pain    Absence of bladder continence    Fecal incontinence    Congenital adrenal hyperplasia (HCC)    Back pain 11/27/2014   Morbid obesity 11/08/2014   Arthritis 06/04/2014    REFERRING DIAG: Spondylolisthesis, lumbar region [M43.16]   THERAPY DIAG:  Other low back pain   Rationale for Evaluation and Treatment Rehabilitation  PERTINENT HISTORY: Morbid obesity, fecal incontinence, long term use of steroids (d/t adrenal hyperplasia)   PRECAUTIONS: spondylolisthesis L4-L5 (but likely fused per spine MD)  SUBJECTIVE:                                                                                                                                                                                      SUBJECTIVE STATEMENT:  Patient states that "It's doing  better, the exercises are helping me psychologically to direct my attentions away from the pain. I also think the peppermint and turmeric that I'm using is starting to help me with the pain. I'm happy that I'm learning some new techniques." She states that she is still using lumbar brace at home.   PAIN:  Are you having pain? Yes: NPRS scale: 9/10 Pain location: low back Pain description: frequent spasms Aggravating factors: constant Relieving factors: use of back brace   OBJECTIVE:   DIAGNOSTIC FINDINGS:  MRI lumbar spine 06/2022  IMPRESSION: 1. No acute findings. 2. Stable anterolisthesis of L4 on L5. 3. Multilevel degenerative  disc disease and facet arthropathy.  GENERAL OBSERVATION/GAIT:  Intermittent jolts of severe pain in sitting  SENSATION:  Light touch: Deficits L5 R 10/02/22: Patient is extremely TTP along R hip and R lumbar spine.    LE MMT:  MMT Right 09/27/22 Left 09/27/22  Hip flexion (L2, L3) 4 3+  Knee extension (L3) 3+ 3*  Knee flexion 4 3+*  Hip abduction    Hip extension    Hip external rotation    Hip internal rotation    Hip adduction    Ankle dorsiflexion (L4)    Ankle plantarflexion (S1)    Ankle inversion    Ankle eversion    Great Toe ext (L5)    Grossly     (Blank rows = not tested, score listed is out of 5 possible points.  N = WNL, D = diminished, C = clear for gross weakness with myotome testing, * = concordant pain with testing)   LUMBAR SPECIAL TESTS:   Slump: L (-), R (++++)    LE ROM:  ROM Right 09/27/22 Left 09/27/22  Hip flexion    Hip extension    Hip abduction    Hip adduction    Hip internal rotation    Hip external rotation    Knee flexion    Knee extension    Ankle dorsiflexion    Ankle plantarflexion    Ankle inversion    Ankle eversion      (Blank rows = not tested, N = WNL, * = concordant pain with testing)  Functional Tests  Eval (10/02/2022)    10 m max gait speed: 13'', .77 m/s, AD: Y    30'' STS:  6.5x  UE used? Y    2 MWT: 152 ft                                                  PATIENT SURVEYS:  FOTO 27 -> 46    [facility] Adult PT Treatment:                                                DATE: 10/09/22  Neuromuscular re-ed: Supine Trunk Rotation, 2 x 5, with cueing for diaphragmatic breathing Seated Ball squeeze (hip abduction) with cueing for abdominal bracing, 2 x 5, 3 sec Seated marches with abdominal bracing x 5 (increased symptoms/freq of mm spasms)  Seated hip abduction/clamshells with theraband (green), 1 x 10  Hooklying abdominal bracing 2 x 10, 5 sec with cueing Ongoing PNE regarding goal of exercises and PT POC to improve lumbar stabilization and desensitization of nervous system  Diaphragmatic Breathing between exercises for pain modulation   Modalities: Moist Heat pack applied, 10 minutes with seated exercises    [facility] Adult PT Treatment:                                                DATE: 10/02/22  Neuromuscular re-ed: Seated hip abduction/clamshells with theraband (green) x 20 Seated marches with abdominal bracing x 10 Hooklying hip adduction with with abdominal bracing ball squeeze 2 x 10, 3  sec hold  Hooklying abdominal bracing 2 x 10, 5 sec with cueing PNE regarding goal of today's interventions and PT POC to improve lumbar stabilization and desensitization of nervous system  Diaphragmatic breathing between sets for pain modulation.   Provided updated HEP today based on response to treatment.    Treatment 09/27/22  Therapeutic Activity - PNE regarding pain vs harm and the bodies alarm system  PATIENT EDUCATION:  POC, diagnosis, prognosis, HEP, and outcome measures.  Pt educated via explanation, demonstration, and handout (HEP).  Pt confirms understanding verbally.   HOME EXERCISE PROGRAM: Access Code: [email or web address] URL: [email or web address] Date: 10/02/2022 Prepared by: Mauri Reading  Exercises - Supine Lower Trunk Rotation  - 1  x daily - 7 x weekly - 2 sets - 10 reps - Supine Hip Adduction Isometric with Ball  - 1 x daily - 7 x weekly - 2 sets - 10 reps - 3 sec hold - Supine Transversus Abdominis Bracing - Hands on Stomach  - 1 x daily - 7 x weekly - 2 sets - 10 reps - Seated Hip Abduction with Resistance  - 1 x daily - 7 x weekly - 2 sets - 10 reps  Treatment priorities   Eval (09/27/22)        Gentle strengthening as tolerated        PNE        aquatics        endurance                  ASSESSMENT:  CLINICAL IMPRESSION: Patient is continuing to report 9-10/10 pain in low back pain. She is unable to tolerate >10 repetitions of each exercises at this time due to severity of mm spasms. Patient reported increased intensity of muscle spasms following seated marches and was instructed to hold off on completing them at home at this time. Pt reports that she plans to take either turmeric or ibuprofen when she gets home to manage pain, but will continue to following HEP as tolerated. Educated patient regarding purpose of exercise to improve safety and pain severity with daily activities including stepping up on the curb, walking, stair navigation. We will continue with skilled PT and progression of therapeutic exercises as tolerated in order to reach established functional goals.   OBJECTIVE IMPAIRMENTS: Pain, endurance, LE strength, core strength  ACTIVITY LIMITATIONS: walking, standing, sitting  PERSONAL FACTORS: See medical history and pertinent history   REHAB POTENTIAL: Fair chronic severe pain  CLINICAL DECISION MAKING: Stable/uncomplicated  EVALUATION COMPLEXITY: Low   GOALS:   SHORT TERM GOALS: Target date: 10/25/2022  Mishel will be >75% HEP compliant to improve carryover between sessions and facilitate independent management of condition  Evaluation (09/27/22): ongoing Goal status: INITIAL   LONG TERM GOALS: Target date: 11/22/2022  [place] will improve FOTO score to 46 as a proxy for  functional improvement  Evaluation/Baseline (09/27/22): 27 Goal status: INITIAL   2.  Shanetra will self report >/= 50% decrease in pain from evaluation   Evaluation/Baseline (09/27/22): 10/10 constant Goal status: INITIAL   3.  Caidyn will improve two minute walk test to 220 feet (MCID 40 ft).  Evaluation/Baseline (09/27/22): 152 ft Goal status: INITIAL   4.  Tasharra will improve 30'' STS (MCID 2) to >/= 9x (w/ UE?: y) to show improved LE strength and improved transfers   Evaluation/Baseline (09/27/22): 6.5x  w/ UE? y Goal status: INITIAL    PLAN: PT FREQUENCY: 1-2x/week  PT DURATION: 8 weeks (Ending 11/22/2022)  PLANNED INTERVENTIONS: Therapeutic exercises, Aquatic therapy, Therapeutic activity, Neuro Muscular re-education, Gait training, Patient/Family education, Joint mobilization, Dry Needling, Electrical stimulation, Spinal mobilization and/or manipulation, Moist heat, Taping, Vasopneumatic device, Ionotophoresis 4mg /ml Dexamethasone, and Manual therapy  Plan for next visit: consider incorporation of spinal and LE isometric activities for pain modulation, and LE ergometry. Hold on seated marches until appropriate to trial again.    Mauri Reading, PT 10/09/2022, 8:26 AM

## 2022-10-11 ENCOUNTER — Ambulatory Visit: Payer: Medicare HMO

## 2022-10-11 DIAGNOSIS — M5459 Other low back pain: Secondary | ICD-10-CM | POA: Diagnosis not present

## 2022-10-11 DIAGNOSIS — M6281 Muscle weakness (generalized): Secondary | ICD-10-CM

## 2022-10-11 NOTE — Therapy (Signed)
OUTPATIENT PHYSICAL THERAPY TREATMENT NOTE   Patient Name: Tracy Larson MRN: [id number] DOB:26-Apr-1956, 67 y.o., female Today's Date: 10/11/2022  PCP: Elenore Paddy, NP  REFERRING PROVIDER: Rodolph Bong, MD    END OF SESSION:   PT End of Session - 10/11/22 0828     Visit Number 4    PT Start Time 0830    PT Stop Time 0848    PT Time Calculation (min) 18 min    Activity Tolerance Patient limited by pain    Behavior During Therapy [facility] for tasks assessed/performed              Past Medical History:  Diagnosis Date   Adrenal hyperplasia    Multiple gastric ulcers    Shingles    "internal"   Past Surgical History:  Procedure Laterality Date   CESAREAN SECTION     TUBAL LIGATION  1985   Patient Active Problem List   Diagnosis Date Noted   Iliotibial band syndrome 08/22/2022   Acute low back pain 06/29/2022   Barrett's esophagus 05/17/2022   Chronic RLQ pain 04/19/2022   Class 2 obesity without serious comorbidity with body mass index (BMI) of 35.0 to 35.9 in adult 04/19/2022   UTI (urinary tract infection) 01/12/2022   Pelvic pain in female 01/12/2022   Kidney lesion, native, right 01/12/2022   Right lower quadrant abdominal pain 11/30/2021   Adrenal insufficiency 07/29/2020   Chest pain 08/17/2017   Vitamin D deficiency 02/14/2016   Long term current use of systemic steroids 02/10/2015   Osteopenia 02/10/2015   Notalgia    Bilateral thoracic back pain    Absence of bladder continence    Fecal incontinence    Congenital adrenal hyperplasia (HCC)    Back pain 11/27/2014   Morbid obesity 11/08/2014   Arthritis 06/04/2014    REFERRING DIAG: Spondylolisthesis, lumbar region [M43.16]   THERAPY DIAG:  Other low back pain   Rationale for Evaluation and Treatment Rehabilitation  PERTINENT HISTORY: Morbid obesity, fecal incontinence, long term use of steroids (d/t adrenal hyperplasia)   PRECAUTIONS: spondylolisthesis L4-L5 (but likely fused per  spine MD)  SUBJECTIVE:                                                                                                                                                                                      SUBJECTIVE STATEMENT:  Patient has returned to wearing a brace and use of assistive device to attend PT today. She is requesting a shorter visit due to increased intensity of muscle contractions this morning. She reports feeling unable to tolerate a full session today. Upon further conversation, patient does  report that she spent more time washing dishes yesterday and has not been using external support brace/belt with this activity. "I think that I really need to go home, take my ibuprofen and lay day." She also reports concern with driving home if symptoms worsen with exercise.   PAIN:  Are you having pain? Yes: NPRS scale: 9-10/10 Pain location: low back Pain description: frequent spasms Aggravating factors: constant Relieving factors: use of back brace   OBJECTIVE:   DIAGNOSTIC FINDINGS:  MRI lumbar spine 06/2022  IMPRESSION: 1. No acute findings. 2. Stable anterolisthesis of L4 on L5. 3. Multilevel degenerative disc disease and facet arthropathy.  GENERAL OBSERVATION/GAIT:  Intermittent jolts of severe pain in sitting  SENSATION:  Light touch: Deficits L5 R 10/02/22: Patient is extremely TTP along R hip and R lumbar spine.    LE MMT:  MMT Right 09/27/22 Left 09/27/22  Hip flexion (L2, L3) 4 3+  Knee extension (L3) 3+ 3*  Knee flexion 4 3+*  Hip abduction    Hip extension    Hip external rotation    Hip internal rotation    Hip adduction    Ankle dorsiflexion (L4)    Ankle plantarflexion (S1)    Ankle inversion    Ankle eversion    Great Toe ext (L5)    Grossly     (Blank rows = not tested, score listed is out of 5 possible points.  N = WNL, D = diminished, C = clear for gross weakness with myotome testing, * = concordant pain with testing)   LUMBAR SPECIAL  TESTS:   Slump: L (-), R (++++)    LE ROM:  ROM Right 09/27/22 Left 09/27/22  Hip flexion    Hip extension    Hip abduction    Hip adduction    Hip internal rotation    Hip external rotation    Knee flexion    Knee extension    Ankle dorsiflexion    Ankle plantarflexion    Ankle inversion    Ankle eversion      (Blank rows = not tested, N = WNL, * = concordant pain with testing)  Functional Tests  Eval (10/02/2022)    10 m max gait speed: 13'', .77 m/s, AD: Y    30'' STS: 6.5x  UE used? Y    2 MWT: 152 ft                                                  PATIENT SURVEYS:  FOTO 27 -> 46   [facility] Adult PT Treatment:                                                DATE: 10/11/22  Therapeutic Activity:  Patient education concurrent with subjective assessment and moist heat pack application to address: pain science education, exacerbation of symptoms, activity pacing/modifications, indications for use of lumbar brace ("belt"), and possible benefits of heating pad for pain management at home.   Modalities: Moist Heat pack applied to lower back, while seated in chair, with 4" step under feet for improved spinal alignment x 15 minutes     [facility] Adult PT Treatment:  DATE: 10/09/22  Neuromuscular re-ed: Supine Trunk Rotation, 2 x 5, with cueing for diaphragmatic breathing Seated Ball squeeze (hip abduction) with cueing for abdominal bracing, 2 x 5, 3 sec Seated marches with abdominal bracing x 5 (increased symptoms/freq of mm spasms)  Seated hip abduction/clamshells with theraband (green), 1 x 10  Hooklying abdominal bracing 2 x 10, 5 sec with cueing Ongoing PNE regarding goal of exercises and PT POC to improve lumbar stabilization and desensitization of nervous system  Diaphragmatic Breathing between exercises for pain modulation   Modalities: Moist Heat pack applied, 10 minutes with seated exercises    [facility] Adult PT  Treatment:                                                DATE: 10/02/22  Neuromuscular re-ed: Seated hip abduction/clamshells with theraband (green) x 20 Seated marches with abdominal bracing x 10 Hooklying hip adduction with with abdominal bracing ball squeeze 2 x 10, 3 sec hold  Hooklying abdominal bracing 2 x 10, 5 sec with cueing PNE regarding goal of today's interventions and PT POC to improve lumbar stabilization and desensitization of nervous system  Diaphragmatic breathing between sets for pain modulation.   Provided updated HEP today based on response to treatment.    Treatment 09/27/22  Therapeutic Activity - PNE regarding pain vs harm and the bodies alarm system  PATIENT EDUCATION:  POC, diagnosis, prognosis, HEP, and outcome measures.  Pt educated via explanation, demonstration, and handout (HEP).  Pt confirms understanding verbally.   HOME EXERCISE PROGRAM: Access Code: [email or web address] URL: [email or web address] Date: 10/02/2022 Prepared by: Mauri ReadingMonique Rethel Sebek  Exercises - Supine Lower Trunk Rotation  - 1 x daily - 7 x weekly - 2 sets - 10 reps - Supine Hip Adduction Isometric with Ball  - 1 x daily - 7 x weekly - 2 sets - 10 reps - 3 sec hold - Supine Transversus Abdominis Bracing - Hands on Stomach  - 1 x daily - 7 x weekly - 2 sets - 10 reps - Seated Hip Abduction with Resistance  - 1 x daily - 7 x weekly - 2 sets - 10 reps  Treatment priorities   Eval (09/27/22)        Gentle strengthening as tolerated        PNE        aquatics        endurance                  ASSESSMENT:  CLINICAL IMPRESSION: Patient presented to PT today with increased intensity and frequency of muscle contractions compared to our last session. Her symptoms were not improved while seated with moist heat pack applied to lower back. She was visibly in more discomfort as compared with her last visit and unable to tolerate exercises today. Discussed activity pacing with daily  activities, utilization of lumbar brace/belt while performing standing tasks at home, including washing dishes, and trial of moist heat pack at home for muscle relaxation. Today was an abbreviated session due to pain intensity and limited activity tolerance. Plan to incorporate desensitization techniques or electrical stimulation application at next visits.   OBJECTIVE IMPAIRMENTS: Pain, endurance, LE strength, core strength  ACTIVITY LIMITATIONS: walking, standing, sitting  PERSONAL FACTORS: See medical history and pertinent history   REHAB POTENTIAL: Fair chronic severe pain  CLINICAL DECISION MAKING:  Stable/uncomplicated  EVALUATION COMPLEXITY: Low   GOALS:   SHORT TERM GOALS: Target date: 10/25/2022  Eura will be >75% HEP compliant to improve carryover between sessions and facilitate independent management of condition  Evaluation (09/27/22): ongoing Goal status: INITIAL   LONG TERM GOALS: Target date: 11/22/2022  Yarelis will improve FOTO score to 46 as a proxy for functional improvement  Evaluation/Baseline (09/27/22): 27 Goal status: INITIAL   2.  Jer[place]o will self report >/= 50% decrease in pain from evaluation   Evaluation/Baseline (09/27/22): 10/10 constant Goal status: INITIAL   3.  [place] will improve two minute walk test to 220 feet (MCID 40 ft).  Evaluation/Baseline (09/27/22): 152 ft Goal status: INITIAL   4.  Tahiya will improve 30'' STS (MCID 2) to >/= 9x (w/ UE?: y) to show improved LE strength and improved transfers   Evaluation/Baseline (09/27/22): 6.5x  w/ UE? y Goal status: INITIAL    PLAN: PT FREQUENCY: 1-2x/week  PT DURATION: 8 weeks (Ending 11/22/2022)  PLANNED INTERVENTIONS: Therapeutic exercises, Aquatic therapy, Therapeutic activity, Neuro Muscular re-education, Gait training, Patient/Family education, Joint mobilization, Dry Needling, Electrical stimulation, Spinal mobilization and/or manipulation, Moist heat, Taping,  Vasopneumatic device, Ionotophoresis 4mg /ml Dexamethasone, and Manual therapy  Plan for next visit: consider incorporation electrical stimulation and densensitization techniques at next visit. Hold on seated marches until appropriate to trial again.    Mauri Reading, PT 10/11/2022, 9:22 AM

## 2022-10-16 ENCOUNTER — Ambulatory Visit: Payer: Medicare HMO

## 2022-10-18 ENCOUNTER — Ambulatory Visit: Payer: Medicare HMO

## 2022-10-19 ENCOUNTER — Ambulatory Visit (INDEPENDENT_AMBULATORY_CARE_PROVIDER_SITE_OTHER): Payer: Medicare HMO | Admitting: Internal Medicine

## 2022-10-19 ENCOUNTER — Encounter: Payer: Self-pay | Admitting: Internal Medicine

## 2022-10-19 VITALS — BP 124/78 | HR 85 | Temp 98.2°F | Ht 62.0 in | Wt 186.0 lb

## 2022-10-19 DIAGNOSIS — M5416 Radiculopathy, lumbar region: Secondary | ICD-10-CM | POA: Diagnosis not present

## 2022-10-19 DIAGNOSIS — F419 Anxiety disorder, unspecified: Secondary | ICD-10-CM

## 2022-10-19 DIAGNOSIS — E559 Vitamin D deficiency, unspecified: Secondary | ICD-10-CM | POA: Diagnosis not present

## 2022-10-19 DIAGNOSIS — R69 Illness, unspecified: Secondary | ICD-10-CM | POA: Diagnosis not present

## 2022-10-19 MED ORDER — PREDNISONE 10 MG PO TABS: ORAL_TABLET | ORAL | 0 refills | Status: DC

## 2022-10-19 MED ORDER — HYDROCODONE-ACETAMINOPHEN 7.5-325 MG PO TABS: 1.0000 | ORAL_TABLET | Freq: Four times a day (QID) | ORAL | 0 refills | Status: DC | PRN

## 2022-10-19 MED ORDER — KETOROLAC TROMETHAMINE 30 MG/ML IJ SOLN
30.0000 mg | Freq: Once | INTRAMUSCULAR | Status: AC
  Administered 2022-10-19: 30 mg via INTRAMUSCULAR

## 2022-10-19 MED ORDER — GABAPENTIN 100 MG PO CAPS: 100.0000 mg | ORAL_CAPSULE | Freq: Three times a day (TID) | ORAL | 3 refills | Status: DC

## 2022-10-19 MED ORDER — METHYLPREDNISOLONE ACETATE 80 MG/ML IJ SUSP
80.0000 mg | Freq: Once | INTRAMUSCULAR | Status: AC
  Administered 2022-10-19: 80 mg via INTRAMUSCULAR

## 2022-10-19 NOTE — Progress Notes (Unsigned)
Patient ID: Tracy Larson, female   DOB: 1956/06/27, 67 y.o.   MRN: [id number]        Chief Complaint: follow up right lumbar radiculopathy severe flare > 1 wk      HPI:  Tracy Larson is a 67 y.o. female here with c/o 10/10 pain to right lower back, almost went to ED but trying to avoid there, but assoc with marked burning pain and numbness, mild weakness to the RLE for 3 days after her first PT eval and tx session, Incidentally pt was just seen per Dr Yetta Barre Neurosurgury 4/9 with MRI prior c/w lumbar spinal stenosis and bilat L5-S1 neuroforaminal narrowing.  Pt also recommended for ESI but she does not want this and does not intend to do that.  No falls,  Pt denies chest pain, increased sob or doe, wheezing, orthopnea, PND, increased LE swelling, palpitations, dizziness or syncope.   Pt denies polydipsia, polyuria  Denies worsening depressive symptoms, suicidal ideation, or panic       Wt Readings from Last 3 Encounters:  10/19/22 186 lb (84.4 kg)  09/03/22 187 lb 9.6 oz (85.1 kg)  08/22/22 187 lb (84.8 kg)   BP Readings from Last 3 Encounters:  10/19/22 124/78  09/03/22 110/74  08/22/22 120/68         Past Medical History:  Diagnosis Date   Adrenal hyperplasia    Multiple gastric ulcers    Shingles    "internal"   Past Surgical History:  Procedure Laterality Date   CESAREAN SECTION     TUBAL LIGATION  1985    reports that she has never smoked. She has never used smokeless tobacco. She reports that she does not drink alcohol and does not use drugs. family history is not on file. Allergies  Allergen Reactions   Other Anaphylaxis    Iodine; has tolerated in the past   Current Outpatient Medications on File Prior to Visit  Medication Sig Dispense Refill   hydrocortisone (CORTEF) 10 MG tablet TAKE 3 TABLETS BY MOUTH DAILY IN AM AND 3 TABLET IN  PM 540 tablet 3   hydrocortisone sodium succinate (SOLU-CORTEF) 100 MG injection Inject 100 mg as needed for adrenal crisis 10 each  prn   Misc Natural Products (GINSENG COMPLEX PO) Take 2 tablets by mouth daily. 500 mg per patient     No current facility-administered medications on file prior to visit.        ROS:  All others reviewed and negative.  Objective        PE:  BP 124/78 (BP Location: Left Arm, Patient Position: Sitting, Cuff Size: Normal)   Pulse 85   Temp 98.2 F (36.8 C) (Oral)   Ht  (1.575 m)   Wt 186 lb (84.4 kg)   SpO2 99%   BMI 34.02 kg/m                 Constitutional: Pt appears in severe pain, prefers to lie on her left side on the exam table for whole visit today, grimacing in pain frequenty and somewhat tearful, declines to sit up for exam due to pain worsening               HENT: Head: NCAT.                Right Ear: External ear normal.                 Left Ear: External ear normal.  Eyes: . Pupils are equal, round, and reactive to light. Conjunctivae and EOM are normal               Nose: without d/c or deformity               Neck: Neck supple. Gross normal ROM               Cardiovascular: Normal rate and regular rhythm.                 Pulmonary/Chest: Effort normal and breath sounds without rales or wheezing.                Abd:  Soft, NT, ND, + BS, no organomegaly               Neurological: Pt is alert. At baseline orientation, motor with 4/5 RLE weakness               Skin: Skin is warm. No rashes, no other new lesions, LE edema - none               Psychiatric: Pt behavior is normal without agitation , mod nervous  Micro: none  Cardiac tracings I have personally interpreted today:  none  Pertinent Radiological findings (summarize): none   Lab Results  Component Value Date   WBC 7.2 06/06/2022   HGB 14.4 06/06/2022   HCT 44.5 06/06/2022   PLT 268 06/06/2022   GLUCOSE 98 06/06/2022   CHOL 194 04/19/2022   TRIG 128.0 04/19/2022   HDL 57.30 04/19/2022   LDLCALC 111 (H) 04/19/2022   ALT 10 06/06/2022   AST 21 06/06/2022   NA 139 06/06/2022   K 3.8  06/06/2022   CL 105 06/06/2022   CREATININE 0.73 06/06/2022   BUN 11 06/06/2022   CO2 22 06/06/2022   TSH 1.80 04/19/2022   INR 1.05 03/22/2016   HGBA1C 5.4 04/19/2022   Assessment/Plan:  Tracy Larson is a 67 y.o. Black or African American [2] female with  has a past medical history of Adrenal hyperplasia, Multiple gastric ulcers, and Shingles.  Right lumbar radiculopathy With severe flare symptoms possibly related to PT session x 1 after recently seen per Dr Yetta Barre NS and then PT eval, continues to decline ESI and does not want to see NS again at this time; today for toradol 30 mg IM, depomedrol 80 mg IM, prednisone taper, vicodin 7.5 325 prn, gabapentin 100 tid and asked to f/u with Dr Yetta Barre PCP in 2 wks  Vitamin D deficiency Last vitamin D Lab Results  Component Value Date   VD25OH 27.34 (L) 03/31/2020   Low reminded to start oral replacement   Anxiety Situational flare today mild to mod, declines need for specific tx at this time  Followup: Return in about 2 weeks (around 11/02/2022) for to Dr Yetta Barre PCP.  Oliver Barre, MD 10/21/2022 7:02 AM [facility]

## 2022-10-19 NOTE — Patient Instructions (Signed)
You had the pain (toradol 30 mg), and steroid shots today (depomedrol 80 mg)  Please take all new medication as prescribed - the hydrocodone as needed for pain, prednisone for inflammation, and gabapentin for the leg nerve pain  Please continue all other medications as before, and refills have been done if requested.  Please have the pharmacy call with any other refills you may need.  Please keep your appointments with your specialists as you may have planned -   Please make an Appointment to return in 2 weeks to Dr Yetta Barre here at [facility], or sooner if needed

## 2022-10-21 ENCOUNTER — Encounter: Payer: Self-pay | Admitting: Internal Medicine

## 2022-10-21 DIAGNOSIS — F419 Anxiety disorder, unspecified: Secondary | ICD-10-CM | POA: Insufficient documentation

## 2022-10-21 NOTE — Assessment & Plan Note (Signed)
With severe flare symptoms possibly related to PT session x 1 after recently seen per Dr Yetta Barre NS and then PT eval, continues to decline ESI and does not want to see NS again at this time; today for toradol 30 mg IM, depomedrol 80 mg IM, prednisone taper, vicodin 7.5 325 prn, gabapentin 100 tid and asked to f/u with Dr Yetta Barre PCP in 2 wks

## 2022-10-21 NOTE — Assessment & Plan Note (Signed)
Last vitamin D Lab Results  Component Value Date   VD25OH 27.34 (L) 03/31/2020   Low reminded to start oral replacement

## 2022-10-21 NOTE — Assessment & Plan Note (Signed)
Situational flare today mild to mod, declines need for specific tx at this time

## 2022-10-23 ENCOUNTER — Ambulatory Visit: Payer: Medicare HMO

## 2022-10-24 ENCOUNTER — Telehealth: Payer: Self-pay | Admitting: Nurse Practitioner

## 2022-10-24 NOTE — Telephone Encounter (Signed)
error 

## 2022-10-25 ENCOUNTER — Ambulatory Visit: Payer: Medicare HMO | Admitting: Physical Therapy

## 2022-10-26 ENCOUNTER — Encounter: Payer: Self-pay | Admitting: Family Medicine

## 2022-10-26 ENCOUNTER — Ambulatory Visit (INDEPENDENT_AMBULATORY_CARE_PROVIDER_SITE_OTHER): Payer: Medicare HMO | Admitting: Family Medicine

## 2022-10-26 VITALS — BP 132/84 | HR 65 | Temp 97.6°F | Ht 62.0 in | Wt 190.0 lb

## 2022-10-26 DIAGNOSIS — M5416 Radiculopathy, lumbar region: Secondary | ICD-10-CM | POA: Diagnosis not present

## 2022-10-26 DIAGNOSIS — E2740 Unspecified adrenocortical insufficiency: Secondary | ICD-10-CM | POA: Diagnosis not present

## 2022-10-26 DIAGNOSIS — M8580 Other specified disorders of bone density and structure, unspecified site: Secondary | ICD-10-CM | POA: Diagnosis not present

## 2022-10-26 NOTE — Progress Notes (Signed)
Subjective:     Patient ID: Tracy Larson, female    DOB: June 23, 1956, 67 y.o.   MRN: [id number]  Chief Complaint  Patient presents with   Pain    Lower right leg and hip pain as well as lower abdomen. Wants more prednisone    HPI Patient is in today for persistent low back pain.  States she is doing well today and is not even using her cane. Pain is 5/10 and manageable.   States she took the steroids prescribed by Dr. Yetta Barre, 60 mg daily and not the tapering dose as prescribed.  She is out of prednisone now and is requesting a refill.   Request referral to sports medicine. States she does not want to go back to neurosurgery.   States she did not tolerate PT.       Health Maintenance Due  Topic Date Due   Zoster Vaccines- Shingrix (1 of 2) Never done   Pneumonia Vaccine 54+ Years old (1 of 1 - PCV) Never done   DTaP/Tdap/Td (2 - Td or Tdap) 11/13/2021    Past Medical History:  Diagnosis Date   Adrenal hyperplasia (HCC)    Multiple gastric ulcers    Shingles    "internal"    Past Surgical History:  Procedure Laterality Date   CESAREAN SECTION     TUBAL LIGATION  1985    Family History  Problem Relation Age of Onset   Breast cancer Neg Hx     Social History   Socioeconomic History   Marital status: Divorced    Spouse name: Not on file   Number of children: 4   Years of education: Not on file   Highest education level: Associate degree: academic program  Occupational History   Not on file  Tobacco Use   Smoking status: Never   Smokeless tobacco: Never  Substance and Sexual Activity   Alcohol use: No   Drug use: No   Sexual activity: Not Currently    Birth control/protection: Post-menopausal  Other Topics Concern   Not on file  Social History Narrative   Lives alone   Social Determinants of Health   Financial Resource Strain: High Risk (10/25/2022)   Overall Financial Resource Strain (CARDIA)    Difficulty of Paying Living Expenses: Very  hard  Food Insecurity: Food Insecurity Present (10/25/2022)   Hunger Vital Sign    Worried About Running Out of Food in the Last Year: Often true    Ran Out of Food in the Last Year: Often true  Transportation Needs: No Transportation Needs (10/25/2022)   PRAPARE - Administrator, Civil Service (Medical): No    Lack of Transportation (Non-Medical): No  Recent Concern: Transportation Needs - Unmet Transportation Needs (08/27/2022)   PRAPARE - Administrator, Civil Service (Medical): Yes    Lack of Transportation (Non-Medical): No  Physical Activity: Inactive (10/25/2022)   Exercise Vital Sign    Days of Exercise per Week: 0 days    Minutes of Exercise per Session: 0 min  Stress: No Stress Concern Present (10/25/2022)   [employer] of Occupational Health - Occupational Stress Questionnaire    Feeling of Stress : Not at all  Social Connections: Socially Isolated (10/25/2022)   Social Connection and Isolation Panel [NHANES]    Frequency of Communication with Friends and Family: Three times a week    Frequency of Social Gatherings with Friends and Family: Once a week    Attends Religious  Services: Never    Active Member of Clubs or Organizations: No    Attends Banker Meetings: Never    Marital Status: Divorced  Catering manager Violence: Not At Risk (08/16/2022)   Humiliation, Afraid, Rape, and Kick questionnaire    Fear of Current or Ex-Partner: No    Emotionally Abused: No    Physically Abused: No    Sexually Abused: No    Outpatient Medications Prior to Visit  Medication Sig Dispense Refill   gabapentin (NEURONTIN) 100 MG capsule Take 1 capsule (100 mg total) by mouth 3 (three) times daily. 90 capsule 3   HYDROcodone-acetaminophen (NORCO) 7.5-325 MG tablet Take 1 tablet by mouth every 6 (six) hours as needed for moderate pain. 30 tablet 0   hydrocortisone (CORTEF) 10 MG tablet TAKE 3 TABLETS BY MOUTH DAILY IN AM AND 3 TABLET IN  PM 540 tablet 3    hydrocortisone sodium succinate (SOLU-CORTEF) 100 MG injection Inject 100 mg as needed for adrenal crisis 10 each prn   Misc Natural Products (GINSENG COMPLEX PO) Take 2 tablets by mouth daily. 500 mg per patient     predniSONE (DELTASONE) 10 MG tablet 3 tabs by mouth per day for 3 days,2tabs per day for 3 days,1tab per day for 3 days 18 tablet 0   No facility-administered medications prior to visit.    Allergies  Allergen Reactions   Other Anaphylaxis    Iodine; has tolerated in the past    Review of Systems  Constitutional:  Negative for chills and fever.  Respiratory:  Negative for shortness of breath.   Cardiovascular:  Negative for chest pain, palpitations and leg swelling.  Gastrointestinal:  Negative for abdominal pain, constipation, diarrhea, nausea and vomiting.  Genitourinary:  Negative for dysuria, frequency and urgency.  Neurological:  Negative for dizziness.       Objective:    Physical Exam Constitutional:      General: She is not in acute distress.    Appearance: She is not ill-appearing.  Eyes:     Conjunctiva/sclera: Conjunctivae normal.  Cardiovascular:     Rate and Rhythm: Normal rate.  Pulmonary:     Effort: Pulmonary effort is normal.  Musculoskeletal:     Cervical back: Normal range of motion and neck supple.  Skin:    General: Skin is warm and dry.  Neurological:     General: No focal deficit present.     Mental Status: She is alert and oriented to person, place, and time.     Motor: No weakness.     Gait: Gait normal.  Psychiatric:        Mood and Affect: Mood normal.        Behavior: Behavior normal.        Thought Content: Thought content normal.     BP 132/84 (BP Location: Left Arm, Patient Position: Sitting, Cuff Size: Large)   Pulse 65   Temp 97.6 F (36.4 C) (Temporal)   Ht 5\' 2"  (1.575 m)   Wt 190 lb (86.2 kg)   SpO2 99%   BMI 34.75 kg/m  Wt Readings from Last 3 Encounters:  10/26/22 190 lb (86.2 kg)  10/19/22 186 lb  (84.4 kg)  09/03/22 187 lb 9.6 oz (85.1 kg)       Assessment & Plan:   Problem List Items Addressed This Visit       Endocrine   Adrenal insufficiency (HCC)     Nervous and Auditory   Right lumbar radiculopathy -  Primary   Relevant Orders   Ambulatory referral to Sports Medicine     Musculoskeletal and Integument   Osteopenia   Review of her EMR shows that on 10/19/2022 she was given Toradol 30 mg IM, depomedrol 80 mg IM, prednisone taper, vicodin 7.5 325 prn, gabapentin 100 tid  She is on Cortef prescribed by endocrinologist for adrenal insufficiency.  Hx of osteopenia.  Pain is more manageable today 5/10 and she is not needing assistance with her cane.  No new concerns since her last visit.  States she does not want to go back to her neurosurgeon and did not tolerate PT.  Referral to sports medicine per patient request.  I also recommend follow up with her PCP to discuss pain management referral.   I have discontinued Curt Bears. Szczerba's predniSONE. I am also having her maintain her Misc Natural Products (GINSENG COMPLEX PO), hydrocortisone, hydrocortisone sodium succinate, HYDROcodone-acetaminophen, and gabapentin.  No orders of the defined types were placed in this encounter.

## 2022-10-26 NOTE — Patient Instructions (Signed)
Please follow up with your neurosurgeon or your primary care provider Jiles Prows, NP for a possible pain management referral if you do not want to see neurosurgery again.   I have referred you to [facility] as you requested.   Please be aware that taking high dose steroids in addition to your steroid treatment for adrenal insufficiency is not recommended and is not safe.

## 2022-10-29 ENCOUNTER — Ambulatory Visit (INDEPENDENT_AMBULATORY_CARE_PROVIDER_SITE_OTHER): Payer: Medicare HMO | Admitting: Family Medicine

## 2022-10-29 ENCOUNTER — Encounter: Payer: Self-pay | Admitting: Family Medicine

## 2022-10-29 VITALS — BP 116/76 | HR 65 | Ht 62.0 in | Wt 189.0 lb

## 2022-10-29 DIAGNOSIS — M5416 Radiculopathy, lumbar region: Secondary | ICD-10-CM

## 2022-10-29 MED ORDER — GABAPENTIN 100 MG PO CAPS: 100.0000 mg | ORAL_CAPSULE | Freq: Three times a day (TID) | ORAL | 3 refills | Status: DC

## 2022-10-29 NOTE — Progress Notes (Signed)
Rubin Payor, PhD, LAT, ATC acting as a scribe for Clementeen Graham, MD.  Tracy Larson is a 67 y.o. female who presents to [facility] at [facility] today for low back pain. Pt was previously seen by Dr. Denyse Amass on 09/03/22 for L knee pain.   Today, pt c/o LBP ongoing since early April. Pt locates pain to R to midline of her low back radiating into R buttock and posterior thigh.   Radiating pain: yes LE numbness/tingling: yes- whole R leg to toe LE weakness: no Aggravates: laying down Treatments tried: prednisone, Gabapentin  Dx imaging: 06/06/22 L-spine XR  Pertinent review of systems: No fevers or chills  Relevant historical information: Adrenal insufficiency.  About 15 years ago she had an epidural steroid injection that caused a CSF leak and a severe headache.   Exam:  BP 116/76   Pulse 65   Ht 5\' 2"  (1.575 m)   Wt 189 lb (85.7 kg)   SpO2 97%   BMI 34.57 kg/m  General: Well Developed, well nourished, and in no acute distress.   MSK: L-spine nontender palpation spinal midline decreased lumbar motion positive straight leg raise test.  Lower extremity strength is intact. Reflexes are intact.    Lab and Radiology Results  EXAM: MRI THORACIC AND LUMBAR SPINE WITHOUT CONTRAST  TECHNIQUE: Multiplanar and multiecho pulse sequences of the thoracic and lumbar spine were obtained without intravenous contrast.  COMPARISON: None Available.  FINDINGS: MRI THORACIC SPINE FINDINGS  Alignment: No substantial sagittal subluxation.  Vertebrae: Vertebral body heights are maintained. Degenerative/discogenic endplate signal changes without specific evidence of acute fracture or discitis/osteomyelitis. No suspicious bone lesions.  Cord: Normal cord signal.  Paraspinal and other soft tissues: No acute findings.  Disc levels:  Degenerative changes are greatest at T10-T11 where there is disc height loss, endplate Schmorl's nodes and posterior disc bulge  and endplate spurring. Bilateral facet arthropathy. Resulting moderate bilateral foraminal stenosis and mild to moderate canal stenosis.  MRI LUMBAR SPINE FINDINGS  Segmentation: Standard.  Alignment: Grade 1 anterolisthesis of L4 on L5.  Vertebrae: Multilevel degenerative/discogenic endplate signal changes, greatest at at L2-L3 and L5-S1.  Conus medullaris and cauda equina: Conus extends to the T12-L1 level. Conus appears normal.  Paraspinal and other soft tissues: Unremarkable.  Disc levels:  T12-L1: No significant disc protrusion, foraminal stenosis, or canal stenosis.  L1-L2: Slight disc bulge without significant stenosis.  L2-L3: Degenerative disc height loss and desiccation. Disc bulging with bilateral facet arthropathy. Mild bilateral foraminal stenosis. No significant canal stenosis.  L3-L4: Disc bulge. Bilateral facet arthropathy. Mild bilateral foraminal stenosis.  L4-L5: Grade 1 anterolisthesis. Facet arthropathy. Mild-to-moderate canal and bilateral foraminal stenosis.  L5-S1: Disc bulging and bilateral facet arthropathy. Resulting moderate bilateral foraminal stenosis with far lateral disc closely approximating the exiting nerves. Left greater than right subarticular recess narrowing.  IMPRESSION: MR THORACIC SPINE IMPRESSION  Degenerative changes greatest at T10-T11 where there is degenerative disc disease, moderate bilateral foraminal stenosis and mild to moderate canal stenosis.  MR LUMBAR SPINE IMPRESSION  1. At L5-S1, moderate bilateral foraminal stenosis with far lateral disc closely approximating the exiting nerves. Left greater than right subarticular recess narrowing with mild to moderate canal stenosis. 2. At L4-L5, mild-to-moderate canal and bilateral foraminal stenosis.   Electronically Signed By: Feliberto Harts M.D. On: 09/07/2022 09:26  I, Clementeen Graham, personally (independently) visualized and performed the interpretation of the  images attached in this note.      Assessment and Plan:  67 y.o. female with right lumbar radiculopathy at L5 pattern.  This corresponds with her MRI that neurosurgery obtained in March 2024.  She is already had a trial of conventional physical therapy which did not work.  She currently is getting some benefit from gabapentin.  There is no longer any great options for her.  She is very reluctant to consider an epidural steroid injection.  That would be the logical next step based on her prior experience of having a CSF leak with 1 of these procedures about 15 years ago she is rightfully very reluctant to consider this procedure again. Her other choice would be having surgery which she does not want either.  After discussion we came up with a good compromise approach.  She is feeling a little better on gabapentin now and likely will be able to tolerate physical therapy more easily.  Reasonable to retry physical therapy on gabapentin.  If this is not working or she has worsening she will let me know and I will order an epidural steroid injection.  PDMP not reviewed this encounter. Orders Placed This Encounter  Procedures   Ambulatory referral to Physical Therapy    Referral Priority:   Routine    Referral Type:   Physical Medicine    Referral Reason:   Specialty Services Required    Requested Specialty:   Physical Therapy   Meds ordered this encounter  Medications   gabapentin (NEURONTIN) 100 MG capsule    Sig: Take 1 capsule (100 mg total) by mouth 3 (three) times daily.    Dispense:  270 capsule    Refill:  3     Discussed warning signs or symptoms. Please see discharge instructions. Patient expresses understanding.   The above documentation has been reviewed and is accurate and complete Clementeen Graham, M.D. Total encounter time 30 minutes including face-to-face time with the patient and, reviewing past medical record, and charting on the date of service.

## 2022-10-29 NOTE — Patient Instructions (Addendum)
Thank you for coming in today.   Retrial PT on Gabapenitn.   If this is not working or you are getting worse let me know and I will get you set up for an injection.   Please keep me updated.   We have more to do if needed.

## 2022-11-01 ENCOUNTER — Ambulatory Visit: Payer: Medicare HMO | Admitting: Physical Therapy

## 2022-11-08 ENCOUNTER — Ambulatory Visit: Payer: Medicare HMO | Admitting: Physical Therapy

## 2022-11-17 ENCOUNTER — Ambulatory Visit: Payer: Medicare HMO | Admitting: Physical Therapy

## 2022-11-27 DIAGNOSIS — G8929 Other chronic pain: Secondary | ICD-10-CM | POA: Diagnosis not present

## 2022-11-27 DIAGNOSIS — R1031 Right lower quadrant pain: Secondary | ICD-10-CM | POA: Diagnosis not present

## 2022-12-27 DIAGNOSIS — R3121 Asymptomatic microscopic hematuria: Secondary | ICD-10-CM | POA: Diagnosis not present

## 2023-01-04 DIAGNOSIS — R3121 Asymptomatic microscopic hematuria: Secondary | ICD-10-CM | POA: Diagnosis not present

## 2023-01-18 DIAGNOSIS — M7061 Trochanteric bursitis, right hip: Secondary | ICD-10-CM | POA: Diagnosis not present

## 2023-01-18 DIAGNOSIS — R3121 Asymptomatic microscopic hematuria: Secondary | ICD-10-CM | POA: Diagnosis not present

## 2023-01-23 ENCOUNTER — Other Ambulatory Visit: Payer: Self-pay | Admitting: Adult Health

## 2023-01-23 DIAGNOSIS — D4102 Neoplasm of uncertain behavior of left kidney: Secondary | ICD-10-CM

## 2023-02-11 DIAGNOSIS — M7061 Trochanteric bursitis, right hip: Secondary | ICD-10-CM | POA: Diagnosis not present

## 2023-02-13 ENCOUNTER — Encounter: Payer: Self-pay | Admitting: Internal Medicine

## 2023-02-13 ENCOUNTER — Ambulatory Visit (INDEPENDENT_AMBULATORY_CARE_PROVIDER_SITE_OTHER): Payer: Medicare HMO | Admitting: Internal Medicine

## 2023-02-13 VITALS — BP 130/70 | HR 82 | Ht 62.0 in | Wt 197.2 lb

## 2023-02-13 DIAGNOSIS — E559 Vitamin D deficiency, unspecified: Secondary | ICD-10-CM

## 2023-02-13 DIAGNOSIS — Z7952 Long term (current) use of systemic steroids: Secondary | ICD-10-CM

## 2023-02-13 DIAGNOSIS — E2740 Unspecified adrenocortical insufficiency: Secondary | ICD-10-CM

## 2023-02-13 DIAGNOSIS — E250 Congenital adrenogenital disorders associated with enzyme deficiency: Secondary | ICD-10-CM

## 2023-02-13 MED ORDER — HYDROCORTISONE SOD SUC (PF) 100 MG IJ SOLR: INTRAMUSCULAR | 99 refills | Status: DC

## 2023-02-13 MED ORDER — HYDROCORTISONE 10 MG PO TABS
ORAL_TABLET | ORAL | 3 refills | Status: DC
Start: 2023-02-13 — End: 2023-08-20

## 2023-02-13 NOTE — Progress Notes (Signed)
Patient ID: Tracy Larson, female   DOB: [birth year], 67 y.o.   MRN: [id number]  HPI  Tracy Larson is a 67 y.o.-year-old very pleasant female, initially referred by her PCP, Dr. Conley Rolls, returning for follow-up for congenital adrenal hyperplasia (most likely non-classical form), adrenal insufficiency, and vitamin D deficiency. Last visit with me 1 year ago.  Interim history: Since last visit she was able to decrease the dose of hydrocortisone 30 mg in am and 15 mg in the pm. She lost 20 lbs before last visit and 4 pounds since then. No salt craving, dizziness, leg swelling. She had many problems since last visit with lumbar radiculopathy, sciatica, pain, and iliotibial syndrome. She is feeling much better, walks daily, tries to change how she thinks about the pain.  Reviewed history: Pt. has been dx with adrenal insufficiency at birth and then 67 y/o - became pregnant (she had 4 children -no history of infertility).   She did not have to have vaginoplasty.  She tried fludrocortisone (but developed edema and hypertension and had to stop).  She has h/o adrenal crises:  1985  2005 - 2/2 flu [facility]  She did not have genetic counseling at dx.  She is on replacement with hydrocortisone previously on high dose, up to 100 mg daily for 15 years when her children were growing up, then decreased to 50 mg daily for another 10 years, 40 mg daily for 16 years.  We decreased the dose and she tried several lower doses, currently on 20 mg in a.m. (at 6 am, then goes back to bed), then wakes up definitively at 12 pm >> changed to 20 mg in am and 10 >> 15 mg in the pm - changed it in 06/2019.  She is aware she needs to increase the dose of glucocorticoid if she has fever and needs to get injectable steroids if she cannot take p.o.   No diabetes, hypertension, fractures.  She does have hirsutism on chin but no acne.  We tried to start eflornithine cream but this was not covered.  Previous  bone density scores were normal: 07/22/17 ([facility]) Lumbar spine L1-L4 Femoral neck (FN) 33% distal radius  T-score 1.6 RFN: 0.4 LFN: 0.3 n/a   [facility], 2013 Lumbar spine (L1-L3) Femoral neck (FN) 33% distal radius  T-score + 0.5 RFN:- 0.1 LFN:+ 0.5 n/a   I ordered a new bone density at our visit from 01/2022 but she did not call to schedule this as she was expecting to be called by the facility....  Reviewed pertinent labs: Her labs initially normalized (except for the 17 hydroxy progesterone) after she decreased the dose of hydrocortisone.  However, her testosterone started to increase afterwards.  At last visit, her ACTH was also high.  I checked with her and she was not getting testosterone transfer.  Component     Latest Ref Rng & Units 03/31/2020  Testosterone, Serum (Total)     ng/dL 83 (H)  % Free Testosterone     % 1.0  Free Testosterone, S     pg/mL 8.3 (H)  Sex Hormone Binding Globulin     nmol/L 47.6  DHEA-Sulfate, LCMS     ug/dL 36  62-ZH-YQMVHQIONGEX, LC/MS/MS      ng/dL 5,284  X324 ACTH     6 - 50 pg/mL 297 (H)   Component     Latest Ref Rng & Units 08/03/2019  Testosterone, Serum (Total)     ng/dL 83 (H)  % Free  Testosterone     % 1.4  Free Testosterone, S     pg/mL 12 (H)  Sex Hormone Binding Globulin     nmol/L 42.2  C206 ACTH     6 - 50 pg/mL 261 (H)  17-OH-Progesterone, LC/MS/MS     * ng/dL 4,098  DHEA-Sulfate, LCMS     ug/dL 17   Component     Latest Ref Rng & Units 07/17/2018  Testosterone, Serum (Total)     ng/dL 61 (H)  % Free Testosterone     % 1.2  Free Testosterone, S     pg/mL 7.3 (H)  Sex Hormone Binding Globulin     nmol/L 53.7  17-OH-Progesterone, LC/MS/MS     * ng/dL 1,191  Y782 ACTH     6 - 50 pg/mL 32  DHEA-Sulfate, LCMS     ug/dL 11   Component     Latest Ref Rng & Units 07/17/2017  Testosterone, Serum (Total)     ng/dL 49  % Free Testosterone     % 1.1  Free Testosterone, S     pg/mL 5.4  Sex Hormone  Binding Globulin     nmol/L 54.8  DHEA-Sulfate, LCMS     ug/dL 14  95-AO-ZHYQMVHQIONG, LC/MS/MS     ng/dL 295  M841 ACTH     6 - 50 pg/mL 9   Component     Latest Ref Rng & Units 02/07/2016  Testosterone     3 - 41 ng/dL 37  Sex Horm Binding Glob, Serum     17.3 - 125.0 nmol/L 46.7  Testosterone Free     0.0 - 4.2 pg/mL 1.8  ALDOSTERONE     0.0 - 30.0 ng/dL 1.0  Renin     3.244 - 5.380 ng/mL/hr 1.048  ALDOS/RENIN RATIO     0.0 - 30.0 1.0  Potassium     3.5 - 5.1 mEq/L 3.9  DHEA-SO4     29.4 - 220.5 ug/dL 01.0 (L)  27-OZDGUYQIHKVQQVZDGLO     ng/dL 756  ACTH     7.2 - 43.3 pg/mL 9.2   Component     Latest Ref Rng & Units 08/06/2014 02/07/2015  Testosterone     10 - 70 ng/dL 66   Sex Horm Binding Glob, Serum     14 - 73 nmol/L 25   Testosterone Free     0.6 - 6.8 pg/mL 13.9 (H)   Testosterone-% Free     0.4 - 2.4 % 2.1   PRA LC/MS/MS     0.25 - 5.82 ng/mL/h 1.67 5.73  ALDO / PRA Ratio     0.9 - 28.9 Ratio 1.8 1.6  ALDOSTERONE     ng/dL 3 9  DHEA     295 - 1,884 ng/dL 27 (L)   Androstenedione     ng/dL 10   16-SA-YTKZSWFUXNAT, LC/MS/MS     ng/dL 5,573 (H) 2,202 (H)  R427 ACTH     6 - 50 pg/mL 78 (H) 57 (H)  Potassium     3.5 - 5.1 mEq/L  3.9  DHEA-SO4     8 - 188 ug/dL  26   12/23/7626: - 17 hydroxyprogesterone 6180 - Hemoglobin A1c 5.4% - Lipids: 176/94/40/126 - TSH 1.94 - LFTs normal - ACTH 201.7 - DHEAS 42.5 - Aldosterone <1, PRA 1.37  Previous records from Dr. Sharl Ma: - Patient was previously on fludrocortisone, however, this rendered her hypertensive and with edema >>  fludrocortisone was eventually stopped. Dr. Sharl Ma mentioned that she  is on a high dose of steroids, and probably her many years of over replacement with steroids have caused her secondary adrenal insufficiency.   Vitamin D deficiency  -Previously on ergocalciferol, then 1 cholecalciferol 12,000 units daily (but she felt this was causing lethargy...) >> at last visit she was on 8000 IU  >> we increased to 10,000 units daily.  At last visit we continued the same dose but I advised her to add K2 vitamin D has the vitamin D absorption.  At last visit she was on Osteo Bi-Flex with 2000 units vitamin D daily.  She came off 2/2  abdominal pain in the past.  At last visit I advised her to resume it, but she forgot...  Reviewed vitamin D levels: Lab Results  Component Value Date   VD25OH 27.34 (L) 03/31/2020   VD25OH 23.86 (L) 08/03/2019   VD25OH 34.12 07/17/2018   VD25OH 19.27 (L) 07/17/2017   VD25OH 16.22 (L) 02/07/2016   VD25OH 11.32 (L) 02/07/2015   Before the coronavirus pandemic she was going to the gym consistently, doing Silver sneakers.   She has a history of iritis. She was on 50 mg Hydrocortisone for the month of 06/2018 b/c of this, then decreased to 30 mg daily.  ROS: + see HPI  I reviewed pt's medications, allergies, PMH, social hx, family hx, and changes were documented in the history of present illness. Otherwise, unchanged from my initial visit note.   Past Medical History:  Diagnosis Date   Adrenal hyperplasia (HCC)    Multiple gastric ulcers    Shingles    "internal"   Past surgical history: - C sections  History   Social History   Marital Status: Married    Spouse Name: [name]    Number of Children: 4   Occupational History    nurse tech    Social History Main Topics   Smoking status: Never Smoker    Smokeless tobacco: Not on file   Alcohol Use: No   Drug Use: No   Current Outpatient Medications on File Prior to Visit  Medication Sig Dispense Refill   gabapentin (NEURONTIN) 100 MG capsule Take 1 capsule (100 mg total) by mouth 3 (three) times daily. 270 capsule 3   HYDROcodone-acetaminophen (NORCO) 7.5-325 MG tablet Take 1 tablet by mouth every 6 (six) hours as needed for moderate pain. 30 tablet 0   hydrocortisone (CORTEF) 10 MG tablet TAKE 3 TABLETS BY MOUTH DAILY IN AM AND 3 TABLET IN  PM 540 tablet 3   hydrocortisone sodium  succinate (SOLU-CORTEF) 100 MG injection Inject 100 mg as needed for adrenal crisis 10 each prn   Misc Natural Products (GINSENG COMPLEX PO) Take 2 tablets by mouth daily. 500 mg per patient     No current facility-administered medications on file prior to visit.   Allergies  Allergen Reactions   Other Anaphylaxis    Iodine; has tolerated in the past    No family history of diabetes, hypertension, hyperlipidemia, heart disease, cancer.   PE: BP 130/70   Pulse 82   Ht 5\' 2"  (1.575 m)   Wt 197 lb 3.2 oz (89.4 kg)   SpO2 99%   BMI 36.07 kg/m    Wt Readings from Last 3 Encounters:  02/13/23 197 lb 3.2 oz (89.4 kg)  10/29/22 189 lb (85.7 kg)  10/26/22 190 lb (86.2 kg)   Constitutional: overweight, in NAD Eyes: no exophthalmos ENT: no masses palpated in neck, no cervical lymphadenopathy Cardiovascular: RRR, No MRG  Respiratory: CTA B Musculoskeletal: no deformities Skin: no rashes Neurological: no tremor with outstretched hands  ASSESSMENT: 1. Nonclassical CAH -In this condition, the adrenal glands cannot produce enough cortisol and the hormone production is redirected towards androgen production via increased ACTH. Therefore, we tx this with glucocorticoids not only to treat the adrenal insufficiency, but also to decrease the ACTH and, therefore, adrenal androgens. OCPs also help in reducing the androgens.   2. Adrenal insufficiency - Associated with her CAH   3. Vitamin D deficiency  PLAN:  1. And 2.   -Patient with a lifelong history of CAH, most likely nonclassical, but with adrenal insufficiency likely developed after overtreatment with steroids.  She did not have to have vaginoplasty and she was successful getting pregnant with 4 children.  She does not have sodium and potassium abnormalities and no hypotension.  Also, no salt craving or dizziness.  She has a history of an absence seizure in the past but this did not appear to be related to adrenal insufficiency and she  was taking plenty of hydrocortisone at that time.  She was also on fludrocortisone in the past but this was stopped due to developing hypertension and edema. -Unfortunately, she continues to take high doses of hydrocortisone and it is very difficult to decrease the doses after she got used to these high doses.  She did not feel well at physiological doses of hydrocortisone. -At last visit, she was on 60 mg of prednisone daily and we discussed about slowly reducing the dose by 10 mg every 4 weeks to 20 mg in a.m. and 10 mg in p.m. at most.  At this visit, she is taking a lower dose, 45 mg daily with 30 mg in a.m. and 50 mg in p.m.  She is now feeling much better and she continues to try to reduce the doses. -Before last visit, she presented to the emergency room several times with different conditions that required prednisone and she was not given the steroid due to her history of adrenal insufficiency. I did discuss with her at last visit and again today that if the prescribing physician believes that she needs prednisone, she absolutely needs to get it (without checking with me or having me prescribe it) and when she finishes the course, to go back to her previous hydrocortisone regimen.  She did have to have injectable hydrocortisone while in the hospital, but not at home.  She also did not have to double the doses of her hydrocortisone since last visit. -She had previous higher ACTH and testosterone levels.  We tried to start eflornithine cream but this was not covered by her insurance.  She is not on spironolactone.  She was not interested in treatment for the hide testosterone since this was mild and she was not bothered by hirsutism.  Of note, DHEA-S level was normal.  17 hydroxyprogesterone checked in the past was not suppressed to indicate over replacement with hydrocortisone. -She had normal bone density T-scores in the past.  At last visit I ordered a bone density scan but she did not have this done.   Will reorder it. -At today's visit, I again advised her to try to reduce her hydrocortisone at her own pace. -Before last visit, she lost 20 pounds since the previous in person visits.  Since then, she lost 4 more pounds. -No labs are needed for now -Refilled her oral and injectable steroid - I suggested to: Patient Instructions  Please continue to try to reduce the  steroid dose slowly (by 10 mg every 4 weeks) to: - Hydrocortisone 20 mg in a.m. and 10 mg in p.m.  - You absolutely need to take this medication every day and not skip doses. - Please double the dose if you have a fever, for the duration of the fever. - If you cannot take anything by mouth (vomiting) or you have severe diarrhea so that you eliminate the hydrocortisone pills in your stool, please make sure that you get the hydrocortisone in the vein instead - go to the nearest emergency department/urgent care or you may go to your PCPs office   Please call and schedule bone density scan at the [facility] Office: [phone].   Start 2000 units vitamin D3 daily.  Please come back for a follow-up appointment in 1 year.  3. Vitamin D deficiency -Previously on Osteo Bi-Flex with 2000 units vitamin D daily but stopped due to stomach pain.  I previously advised her to restart vitamin D 2000 units daily but she did not do so yet. -Latest MND level was still mildly low: Lab Results  Component Value Date   VD25OH 27.34 (L) 03/31/2020  -We will need to repeat a vitamin D level after she resumes the supplement  Orders Placed This Encounter  Procedures   DG Bone Density   [name], MD PhD [facility]

## 2023-02-13 NOTE — Patient Instructions (Addendum)
Please continue to try to reduce the steroid dose slowly (by 10 mg every 4 weeks) to: - Hydrocortisone 20 mg in a.m. and 10 mg in p.m.  - You absolutely need to take this medication every day and not skip doses. - Please double the dose if you have a fever, for the duration of the fever. - If you cannot take anything by mouth (vomiting) or you have severe diarrhea so that you eliminate the hydrocortisone pills in your stool, please make sure that you get the hydrocortisone in the vein instead - go to the nearest emergency department/urgent care or you may go to your PCPs office   Please call and schedule bone density scan at the [facility] Office: [phone].   Start 2000 units vitamin D3 daily.  Please come back for a follow-up appointment in 1 year.

## 2023-03-15 ENCOUNTER — Other Ambulatory Visit: Payer: Medicare HMO

## 2023-06-03 ENCOUNTER — Ambulatory Visit: Payer: Medicare HMO

## 2023-06-03 ENCOUNTER — Telehealth: Payer: Self-pay

## 2023-06-03 DIAGNOSIS — E250 Congenital adrenogenital disorders associated with enzyme deficiency: Secondary | ICD-10-CM

## 2023-06-03 MED ORDER — HYDROCORTISONE SOD SUC (PF) 100 MG IJ SOLR
100.0000 mg | Freq: Once | INTRAMUSCULAR | Status: AC
Start: 2023-06-03 — End: 2023-06-03
  Administered 2023-06-03: 100 mg

## 2023-06-03 NOTE — Progress Notes (Signed)
Patient came in this morning requesting to be shown how to give [facility] for Adrenal Crisis. At this current time she was in a crisis and needed the injection administered.    I provided her with written instructions with pictures to go along with it.    Injection was administered and she sat in the lobby for a few minutes, I went to check on her after she had been sitting for 5-10 mins she was gone.

## 2023-06-03 NOTE — Telephone Encounter (Signed)
Patient came in this morning requesting to be shown how to give [facility] for Adrenal Crisis. At this current time she was in a crisis and needed the injection administered.   I provided her with written instructions with pictures to go along with it.   Injection was administered and she sat in the lobby for a few minutes, I went to check on her after she had been sitting for 5-10 mins she was gone.   Medication: Solu-Cortef  Dosage: 100mg /Vial  Location: R Thigh  NDC: [id number] LOT: [id number] EXP:07/2025  Dr. [name] was also made aware.

## 2023-06-04 ENCOUNTER — Ambulatory Visit (INDEPENDENT_AMBULATORY_CARE_PROVIDER_SITE_OTHER): Payer: Medicare HMO | Admitting: Family Medicine

## 2023-06-04 ENCOUNTER — Encounter: Payer: Self-pay | Admitting: Family Medicine

## 2023-06-04 VITALS — BP 110/72 | HR 62 | Temp 98.4°F | Ht 62.0 in | Wt 207.0 lb

## 2023-06-04 DIAGNOSIS — Z6837 Body mass index (BMI) 37.0-37.9, adult: Secondary | ICD-10-CM | POA: Diagnosis not present

## 2023-06-04 DIAGNOSIS — R1030 Lower abdominal pain, unspecified: Secondary | ICD-10-CM | POA: Diagnosis not present

## 2023-06-04 DIAGNOSIS — R197 Diarrhea, unspecified: Secondary | ICD-10-CM

## 2023-06-04 DIAGNOSIS — R112 Nausea with vomiting, unspecified: Secondary | ICD-10-CM

## 2023-06-04 LAB — CBC WITH DIFFERENTIAL/PLATELET
Basophils Absolute: 0 10*3/uL (ref 0.0–0.1)
Basophils Relative: 0.6 % (ref 0.0–3.0)
Eosinophils Absolute: 0.1 10*3/uL (ref 0.0–0.7)
Eosinophils Relative: 1.1 % (ref 0.0–5.0)
HCT: 41.2 % (ref 36.0–46.0)
Hemoglobin: 13.7 g/dL (ref 12.0–15.0)
Lymphocytes Relative: 17.4 % (ref 12.0–46.0)
Lymphs Abs: 1.6 10*3/uL (ref 0.7–4.0)
MCHC: 33.1 g/dL (ref 30.0–36.0)
MCV: 85.9 fL (ref 78.0–100.0)
Monocytes Absolute: 0.4 10*3/uL (ref 0.1–1.0)
Monocytes Relative: 4.1 % (ref 3.0–12.0)
Neutro Abs: 6.9 10*3/uL (ref 1.4–7.7)
Neutrophils Relative %: 76.8 % (ref 43.0–77.0)
Platelets: 249 10*3/uL (ref 150.0–400.0)
RBC: 4.8 Mil/uL (ref 3.87–5.11)
RDW: 13.8 % (ref 11.5–15.5)
WBC: 9 10*3/uL (ref 4.0–10.5)

## 2023-06-04 LAB — SEDIMENTATION RATE: Sed Rate: 41 mm/h — ABNORMAL HIGH (ref 0–30)

## 2023-06-04 LAB — AMYLASE: Amylase: 122 U/L (ref 27–131)

## 2023-06-04 LAB — C-REACTIVE PROTEIN: CRP: <1 mg/dL (ref 0.5–20.0)

## 2023-06-04 LAB — LIPASE: Lipase: 56 U/L (ref 11.0–59.0)

## 2023-06-04 NOTE — Patient Instructions (Addendum)
We are checking labs today, will be in contact with any results that require further attention  If your labs are normal, we will move forward with the gastric emptying study  If that is normal, we will consider the MRI that we discussed in the office

## 2023-06-04 NOTE — Progress Notes (Signed)
Acute Office Visit  Subjective:     Patient ID: Tracy Larson, female    DOB: January 06, 1956, 67 y.o.   MRN: [id number]  Chief Complaint  Patient presents with   Abdominal Pain    Lower right abdominal pain for several years as well as fluctuating bowel movement patterns. Has had dark stools in the past. PT notes they believe their stomach doesn't have the acid to be able to break down their foods.    Abdominal Pain   Patient is in today for RLQ pain, diarrhea and constipation, nausea, vomiting for the last few years. She is established with a GI provider. Takes Solu-Cortef orally, then as an injection if oral administration is not effective. Has tried laxatives with little relief of pain. Reports that pain gets up to a 10, causes some extreme fatigue and is interrupting her daily life. Feels that this is related to a digestion problem. Has not had gastric emptying study, has never had pelvic MRI. Has had significant workup in the past with no obvious findings for cause of her pain. Has had imaging, records reviewed by me with findings of Bosniak 1 and 2 renal cysts, history of H. pylori infection noted on gastric biopsy from 09/21/2021.  Patient is unsure if she was completely treated for this. She is not fasting today. Denies chills, fever, rash, palpitations, other symptoms. Medical history as outlined below.    Review of Systems  Gastrointestinal:  Positive for abdominal pain.   Per HPI     Objective:    BP 110/72   Pulse 62   Temp 98.4 F (36.9 C) (Oral)   Ht 5\' 2"  (1.575 m)   Wt 207 lb (93.9 kg)   SpO2 99%   BMI 37.86 kg/m    Physical Exam Vitals and nursing note reviewed.  Constitutional:      Appearance: She is well-developed.  HENT:     Head: Normocephalic and atraumatic.  Pulmonary:     Effort: Pulmonary effort is normal.  Abdominal:     General: Abdomen is protuberant. Bowel sounds are increased. There is no distension or abdominal bruit. There  are no signs of injury.     Palpations: Abdomen is soft. There is no fluid wave, hepatomegaly, splenomegaly or mass.     Tenderness: There is abdominal tenderness in the left lower quadrant. There is guarding. There is no rebound. Negative signs include Murphy's sign, Rovsing's sign and McBurney's sign.     Hernia: No hernia is present.  Skin:    General: Skin is warm and dry.  Neurological:     Mental Status: She is alert.     No results found for any visits on 06/04/23.      Assessment & Plan:  1. Lower abdominal pain  - H. pylori breath test - CBC with Differential/Platelet - Comprehensive metabolic panel - Amylase - Lipase - C-reactive protein - Sedimentation rate - Chronic pain, continue steroids  2. Diarrhea, unspecified type  - H. pylori breath test - CBC with Differential/Platelet - Comprehensive metabolic panel - Amylase - Lipase - C-reactive protein - Sedimentation rate  3. Nausea and vomiting, unspecified vomiting type  - H. pylori breath test - CBC with Differential/Platelet - Comprehensive metabolic panel - Amylase - Lipase - C-reactive protein - Sedimentation rate  4.  Morbid obesity  - Continue to try to adhere to somewhat of a normal diet, will address further once abdominal pain improves  - Continue current medication regimen - If  labs are normal, we will proceed with gastric emptying study for further evaluation - If labs and gastric emptying study are normal, we will likely proceed with abdominal pelvic MRI for further evaluation  No orders of the defined types were placed in this encounter.   Return if symptoms worsen or fail to improve.  Moshe Cipro, FNP

## 2023-06-06 LAB — COMPREHENSIVE METABOLIC PANEL
AG Ratio: 1.1 (calc) (ref 1.0–2.5)
ALT: 14 U/L (ref 6–29)
AST: 20 U/L (ref 10–35)
Albumin: 3.9 g/dL (ref 3.6–5.1)
Alkaline phosphatase (APISO): 62 U/L (ref 37–153)
BUN: 12 mg/dL (ref 7–25)
CO2: 27 mmol/L (ref 20–32)
Calcium: 9.1 mg/dL (ref 8.6–10.4)
Chloride: 105 mmol/L (ref 98–110)
Creat: 0.76 mg/dL (ref 0.50–1.05)
Globulin: 3.5 g/dL (ref 1.9–3.7)
Glucose, Bld: 94 mg/dL (ref 65–99)
Potassium: 3.2 mmol/L — ABNORMAL LOW (ref 3.5–5.3)
Sodium: 140 mmol/L (ref 135–146)
Total Bilirubin: 0.6 mg/dL (ref 0.2–1.2)
Total Protein: 7.4 g/dL (ref 6.1–8.1)

## 2023-06-06 LAB — H. PYLORI BREATH TEST: H. pylori Breath Test: NOT DETECTED

## 2023-06-06 LAB — SPECIMEN COMPROMISED

## 2023-06-09 ENCOUNTER — Encounter: Payer: Self-pay | Admitting: Nurse Practitioner

## 2023-06-09 DIAGNOSIS — R112 Nausea with vomiting, unspecified: Secondary | ICD-10-CM

## 2023-06-09 DIAGNOSIS — G8929 Other chronic pain: Secondary | ICD-10-CM

## 2023-06-09 DIAGNOSIS — Z7952 Long term (current) use of systemic steroids: Secondary | ICD-10-CM

## 2023-06-17 ENCOUNTER — Encounter: Payer: Self-pay | Admitting: Family Medicine

## 2023-06-17 ENCOUNTER — Ambulatory Visit (INDEPENDENT_AMBULATORY_CARE_PROVIDER_SITE_OTHER): Payer: Medicare HMO

## 2023-06-17 ENCOUNTER — Ambulatory Visit (INDEPENDENT_AMBULATORY_CARE_PROVIDER_SITE_OTHER): Payer: Medicare HMO | Admitting: Family Medicine

## 2023-06-17 VITALS — BP 112/70 | HR 87 | Temp 98.2°F | Ht 62.0 in | Wt 207.0 lb

## 2023-06-17 DIAGNOSIS — M79671 Pain in right foot: Secondary | ICD-10-CM

## 2023-06-17 DIAGNOSIS — Z9181 History of falling: Secondary | ICD-10-CM

## 2023-06-17 DIAGNOSIS — R200 Anesthesia of skin: Secondary | ICD-10-CM | POA: Diagnosis not present

## 2023-06-17 DIAGNOSIS — M19071 Primary osteoarthritis, right ankle and foot: Secondary | ICD-10-CM | POA: Diagnosis not present

## 2023-06-17 DIAGNOSIS — M7731 Calcaneal spur, right foot: Secondary | ICD-10-CM | POA: Diagnosis not present

## 2023-06-17 DIAGNOSIS — M19271 Secondary osteoarthritis, right ankle and foot: Secondary | ICD-10-CM

## 2023-06-17 DIAGNOSIS — M25571 Pain in right ankle and joints of right foot: Secondary | ICD-10-CM

## 2023-06-17 DIAGNOSIS — G8929 Other chronic pain: Secondary | ICD-10-CM

## 2023-06-17 NOTE — Patient Instructions (Addendum)
We are getting an xray today. We will be in contact with any abnormal results that require further attention.  [facility] Address: [address], [place], [place] [address] Open ? Closes 5?PM Phone: [phone]

## 2023-06-17 NOTE — Progress Notes (Addendum)
   Acute Office Visit  Subjective:     Patient ID: Tracy Larson, female    DOB: [birth year], 67 y.o.   MRN: [id number]  Chief Complaint  Patient presents with   Foot Injury    Right heel numbness. Believes this is derived from breaking two toes and having them reset on their own without medical care (middle and ring). Has not since had any imagine on their foot    HPI Patient is in today for re evaluation of R heel pain and numbness. States that she thinks this may be r/t hx broken toes and having them heal on their own. Has tried gabapentin, pain meds, steroids in the past with little relief.  Per pt, has been told that this could be sciatica. States that she has fallen on this foot a few times in the past and has never had it imaged.  Denies erythema, swelling, inability to bear weight, other symptoms. Medical hx as outlined below.  ROS Per HPI      Objective:    BP 112/70 (BP Location: Left Arm, Patient Position: Sitting)   Pulse 87   Temp 98.2 F (36.8 C) (Oral)   Ht 5\' 2"  (1.575 m)   Wt 207 lb (93.9 kg)   SpO2 98%   BMI 37.86 kg/m    Physical Exam Vitals and nursing note reviewed.  Constitutional:      General: She is not in acute distress.    Appearance: Normal appearance. She is obese.  HENT:     Head: Normocephalic and atraumatic.  Eyes:     Extraocular Movements: Extraocular movements intact.  Pulmonary:     Effort: Pulmonary effort is normal.  Musculoskeletal:        General: Swelling (R heel, R medial midfoot) present. No tenderness.     Cervical back: Normal range of motion.  Skin:    General: Skin is warm and dry.     Capillary Refill: Capillary refill takes less than 2 seconds.     Findings: No bruising or erythema.  Neurological:     General: No focal deficit present.     Mental Status: She is alert and oriented to person, place, and time.     Sensory: Sensory deficit present.     Gait: Gait abnormal (favoring R side).     Comments:  Decreased sensation to R heel to midfoot  Psychiatric:        Mood and Affect: Mood normal.        Thought Content: Thought content normal.     No results found for any visits on 06/17/23.      Assessment & Plan:  1. Chronic heel pain, right (Primary)  - DG Foot Complete Right; Future  2. Other secondary osteoarthritis of right foot  - DG Foot Complete Right; Future  3. Chronic pain of right ankle  - DG Ankle Complete Right; Future  4. History of fall  - DG Foot Complete Right; Future - DG Ankle Complete Right; Future  5. Morbid Obesity  -Discussed healthy diet and activity level  -Continue current medical regimen -Information given to patient today for, rheumatology where we referred on 06/12/2023, she has not been called about this yet   No orders of the defined types were placed in this encounter.   Return if symptoms worsen or fail to improve.  Moshe Cipro, FNP

## 2023-06-18 ENCOUNTER — Encounter: Payer: Self-pay | Admitting: Family Medicine

## 2023-06-18 ENCOUNTER — Telehealth: Payer: Self-pay | Admitting: Nurse Practitioner

## 2023-06-18 DIAGNOSIS — R700 Elevated erythrocyte sedimentation rate: Secondary | ICD-10-CM

## 2023-06-18 DIAGNOSIS — G8929 Other chronic pain: Secondary | ICD-10-CM

## 2023-06-18 DIAGNOSIS — M19271 Secondary osteoarthritis, right ankle and foot: Secondary | ICD-10-CM

## 2023-06-18 DIAGNOSIS — Z7952 Long term (current) use of systemic steroids: Secondary | ICD-10-CM

## 2023-06-18 NOTE — Telephone Encounter (Signed)
[id number] -- Referral #  To: Rheumatology   Pt would like for this referral sent to a specific location Address- [address], [place], [place] [address]  [phone]  [phone]  ([email or web address]  PLEASE ADVISE, [facility]

## 2023-06-21 ENCOUNTER — Encounter: Payer: Self-pay | Admitting: Family Medicine

## 2023-07-11 DIAGNOSIS — M79641 Pain in right hand: Secondary | ICD-10-CM | POA: Diagnosis not present

## 2023-07-11 DIAGNOSIS — M79643 Pain in unspecified hand: Secondary | ICD-10-CM | POA: Diagnosis not present

## 2023-07-11 DIAGNOSIS — M79672 Pain in left foot: Secondary | ICD-10-CM | POA: Diagnosis not present

## 2023-07-11 DIAGNOSIS — M2550 Pain in unspecified joint: Secondary | ICD-10-CM | POA: Diagnosis not present

## 2023-07-11 DIAGNOSIS — M25562 Pain in left knee: Secondary | ICD-10-CM | POA: Diagnosis not present

## 2023-07-11 DIAGNOSIS — M791 Myalgia, unspecified site: Secondary | ICD-10-CM | POA: Diagnosis not present

## 2023-07-11 DIAGNOSIS — M79642 Pain in left hand: Secondary | ICD-10-CM | POA: Diagnosis not present

## 2023-07-11 DIAGNOSIS — M25561 Pain in right knee: Secondary | ICD-10-CM | POA: Diagnosis not present

## 2023-07-11 DIAGNOSIS — M79673 Pain in unspecified foot: Secondary | ICD-10-CM | POA: Diagnosis not present

## 2023-07-11 DIAGNOSIS — M79671 Pain in right foot: Secondary | ICD-10-CM | POA: Diagnosis not present

## 2023-08-01 DIAGNOSIS — M549 Dorsalgia, unspecified: Secondary | ICD-10-CM | POA: Diagnosis not present

## 2023-08-01 DIAGNOSIS — Z79899 Other long term (current) drug therapy: Secondary | ICD-10-CM | POA: Diagnosis not present

## 2023-08-01 DIAGNOSIS — M2550 Pain in unspecified joint: Secondary | ICD-10-CM | POA: Diagnosis not present

## 2023-08-01 DIAGNOSIS — M1990 Unspecified osteoarthritis, unspecified site: Secondary | ICD-10-CM | POA: Diagnosis not present

## 2023-08-19 ENCOUNTER — Other Ambulatory Visit: Payer: Self-pay | Admitting: Internal Medicine

## 2023-08-19 DIAGNOSIS — E250 Congenital adrenogenital disorders associated with enzyme deficiency: Secondary | ICD-10-CM

## 2023-09-02 ENCOUNTER — Ambulatory Visit: Payer: Medicare HMO

## 2023-09-23 DIAGNOSIS — M2550 Pain in unspecified joint: Secondary | ICD-10-CM | POA: Diagnosis not present

## 2023-09-23 DIAGNOSIS — M3505 Sjogren syndrome with inflammatory arthritis: Secondary | ICD-10-CM | POA: Diagnosis not present

## 2023-09-23 DIAGNOSIS — M1990 Unspecified osteoarthritis, unspecified site: Secondary | ICD-10-CM | POA: Diagnosis not present

## 2023-09-23 DIAGNOSIS — Z79899 Other long term (current) drug therapy: Secondary | ICD-10-CM | POA: Diagnosis not present

## 2023-10-28 ENCOUNTER — Ambulatory Visit (INDEPENDENT_AMBULATORY_CARE_PROVIDER_SITE_OTHER)

## 2023-10-28 VITALS — Ht 62.0 in | Wt 207.0 lb

## 2023-10-28 DIAGNOSIS — Z0000 Encounter for general adult medical examination without abnormal findings: Secondary | ICD-10-CM

## 2023-10-28 NOTE — Progress Notes (Cosign Needed)
 Subjective:   Tracy Larson is a 68 y.o. who presents for a Medicare Wellness preventive visit.  Visit Complete: Virtual I connected with  Tracy Larson on 10/28/23 by a audio enabled telemedicine application and verified that I am speaking with the correct person using two identifiers.  Patient Location: Home  Provider Location: Home Office  I discussed the limitations of evaluation and management by telemedicine. The patient expressed understanding and agreed to proceed.  Vital Signs: Because this visit was a virtual/telehealth visit, some criteria may be missing or patient reported. Any vitals not documented were not able to be obtained and vitals that have been documented are patient reported.  VideoDeclined- This patient declined Librarian, academic. Therefore the visit was completed with audio only.  Persons Participating in Visit: Patient.  AWV Questionnaire: Yes: Patient Medicare AWV questionnaire was completed by the patient on 10/25/2023; I have confirmed that all information answered by patient is correct and no changes since this date.        Objective:    Today's Vitals   10/28/23 0810  Weight: 207 lb (93.9 kg)  Height: 5\' 2"  (1.575 m)   Body mass index is 37.86 kg/m.     10/28/2023    8:28 AM 09/27/2022    7:32 AM 08/16/2022    8:23 AM 06/06/2022    1:37 PM 01/19/2022    5:56 AM 12/19/2021    8:04 PM 03/22/2016    5:10 PM  Advanced Directives  Does Patient Have a Medical Advance Directive? Yes No No No No No No  Type of Estate agent of [place];Living will        Copy of Healthcare Power of Attorney in Chart? No - copy requested        Would patient like information on creating a medical advance directive?   No - Patient declined No - Patient declined  No - Patient declined     Current Medications (verified) Outpatient Encounter Medications as of 10/28/2023  Medication Sig   hydrocortisone  (CORTEF ) 10  MG tablet TAKE 3 TABLET BY MOUTH EVERY MORNING AND 1 1/2 IN THE EVENING   hydrocortisone  sodium succinate  (SOLU-CORTEF ) 100 MG injection Inject 100 mg as needed for adrenal crisis   Misc Natural Products (GINSENG COMPLEX PO) Take 2 tablets by mouth daily. 500 mg per patient   Turmeric-Ginger 150-25 MG CHEW as directed Orally   gabapentin  (NEURONTIN ) 100 MG capsule Take 1 capsule (100 mg total) by mouth 3 (three) times daily. (Patient not taking: Reported on 06/17/2023)   HYDROcodone -acetaminophen  (NORCO) 7.5-325 MG tablet Take 1 tablet by mouth every 6 (six) hours as needed for moderate pain. (Patient not taking: Reported on 10/28/2023)   No facility-administered encounter medications on file as of 10/28/2023.    Allergies (verified) Other   History: Past Medical History:  Diagnosis Date   Adrenal hyperplasia (HCC)    Multiple gastric ulcers    Shingles    "internal"   Past Surgical History:  Procedure Laterality Date   CESAREAN SECTION     TUBAL LIGATION  1985   Family History  Problem Relation Age of Onset   Breast cancer Neg Hx    Social History   Socioeconomic History   Marital status: Divorced    Spouse name: Not on file   Number of children: 4   Years of education: Not on file   Highest education level: Some college, no degree  Occupational History   Occupation:  RETIRED  Tobacco Use   Smoking status: Never   Smokeless tobacco: Never  Vaping Use   Vaping status: Never Used  Substance and Sexual Activity   Alcohol  use: No   Drug use: No   Sexual activity: Not Currently    Birth control/protection: Post-menopausal  Other Topics Concern   Not on file  Social History Narrative   Lives alone   Social Drivers of Health   Financial Resource Strain: High Risk (10/28/2023)   Overall Financial Resource Strain (CARDIA)    Difficulty of Paying Living Expenses: Hard  Food Insecurity: No Food Insecurity (10/28/2023)   Hunger Vital Sign    Worried About Running Out of  Food in the Last Year: Never true    Ran Out of Food in the Last Year: Never true  Transportation Needs: No Transportation Needs (10/28/2023)   PRAPARE - Administrator, Civil Service (Medical): No    Lack of Transportation (Non-Medical): No  Physical Activity: Inactive (10/28/2023)   Exercise Vital Sign    Days of Exercise per Week: 0 days    Minutes of Exercise per Session: 0 min  Stress: No Stress Concern Present (10/28/2023)   [employer] of Occupational Health - Occupational Stress Questionnaire    Feeling of Stress : Not at all  Social Connections: Socially Isolated (10/28/2023)   Social Connection and Isolation Panel [NHANES]    Frequency of Communication with Friends and Family: Three times a week    Frequency of Social Gatherings with Friends and Family: Never    Attends Religious Services: Never    Database administrator or Organizations: No    Attends Engineer, structural: Never    Marital Status: Divorced    Tobacco Counseling Counseling given: Not Answered    Clinical Intake:  Pre-visit preparation completed: Yes  Pain : No/denies pain     BMI - recorded: 37.86 Nutritional Status: BMI > 30  Obese Nutritional Risks: None Diabetes: No  Lab Results  Component Value Date   HGBA1C 5.4 04/19/2022   HGBA1C 5.7 02/19/2014     How often do you need to have someone help you when you read instructions, pamphlets, or other written materials from your doctor or pharmacy?: 1 - Never  Interpreter Needed?: No  Information entered by :: Dickie Cloe, RMA   Activities of Daily Living     10/25/2023    1:30 PM  In your present state of health, do you have any difficulty performing the following activities:  Hearing? 0  Vision? 0  Difficulty concentrating or making decisions? 0  Walking or climbing stairs? 1  Dressing or bathing? 0  Doing errands, shopping? 0  Preparing Food and eating ? N  In the past six months, have you accidently  leaked urine? Y  Do you have problems with loss of bowel control? N  Managing your Medications? N  Managing your Finances? N  Housekeeping or managing your Housekeeping? Y    Patient Care Team: Zorita Hiss, NP as PCP - General (Nurse Practitioner)  Indicate any recent Medical Services you may have received from other than [facility] providers in the past year (date may be approximate).     Assessment:   This is a routine wellness examination for [place].  Hearing/Vision screen Hearing Screening - Comments:: Denies hearing difficulties   Vision Screening - Comments:: Denies vision issues.    Goals Addressed   None    Depression Screen     10/28/2023  8:30 AM 10/19/2022    8:35 AM 08/16/2022    8:20 AM 04/19/2022   10:18 AM 07/17/2017    2:51 PM 04/19/2017    1:48 PM 01/26/2015    6:58 PM  PHQ 2/9 Scores  PHQ - 2 Score 0 0 0 0 0 0 0  PHQ- 9 Score 0   0       Fall Risk     10/25/2023    1:30 PM 10/19/2022    8:34 AM 08/16/2022    8:16 AM 04/19/2022   10:18 AM 07/17/2017    2:51 PM  Fall Risk   Falls in the past year? 0 0 0 0 No  Number falls in past yr: 0 0 0 0   Injury with Fall? 0 0 0 0   Risk for fall due to :  No Fall Risks No Fall Risks No Fall Risks   Follow up Falls evaluation completed;Falls prevention discussed Falls evaluation completed Education provided;Falls prevention discussed Falls evaluation completed     MEDICARE RISK AT HOME:  Medicare Risk at Home Any stairs in or around the home?: (Patient-Rptd) No If so, are there any without handrails?: (Patient-Rptd) No Home free of loose throw rugs in walkways, pet beds, electrical cords, etc?: (Patient-Rptd) Yes Adequate lighting in your home to reduce risk of falls?: (Patient-Rptd) Yes Life alert?: (Patient-Rptd) No Use of a cane, walker or w/c?: (Patient-Rptd) Yes Grab bars in the bathroom?: (Patient-Rptd) Yes Shower chair or bench in shower?: (Patient-Rptd) Yes Elevated toilet seat or a handicapped  toilet?: (Patient-Rptd) Yes  TIMED UP AND GO:  Was the test performed?  No  Cognitive Function: Declined/Normal: No cognitive concerns noted by patient or family. Patient alert, oriented, able to answer questions appropriately and recall recent events. No signs of memory loss or confusion.        08/16/2022    8:32 AM  6CIT Screen  What Year? 0 points  What month? 0 points  What time? 0 points  Count back from 20 0 points  Repeat phrase 0 points    Immunizations Immunization History  Administered Date(s) Administered   PPD Test 02/22/2012, 03/02/2014   Tdap 11/14/2011    Screening Tests Health Maintenance  Topic Date Due   COVID-19 Vaccine (1) Never done   Zoster Vaccines- Shingrix (1 of 2) Never done   Pneumonia Vaccine 83+ Years old (1 of 1 - PCV) Never done   DTaP/Tdap/Td (2 - Td or Tdap) 11/13/2021   Medicare Annual Wellness (AWV)  08/17/2023   MAMMOGRAM  09/21/2023   INFLUENZA VACCINE  01/31/2024   Colonoscopy  09/06/2031   DEXA SCAN  Completed   Hepatitis C Screening  Completed   HPV VACCINES  Aged Out   Meningococcal B Vaccine  Aged Out    Health Maintenance  Health Maintenance Due  Topic Date Due   COVID-19 Vaccine (1) Never done   Zoster Vaccines- Shingrix (1 of 2) Never done   Pneumonia Vaccine 76+ Years old (1 of 1 - PCV) Never done   DTaP/Tdap/Td (2 - Td or Tdap) 11/13/2021   Medicare Annual Wellness (AWV)  08/17/2023   MAMMOGRAM  09/21/2023   Health Maintenance Items Addressed: See Nurse Notes  Additional Screening:  Vision Screening: Recommended annual ophthalmology exams for early detection of glaucoma and other disorders of the eye.  Dental Screening: Recommended annual dental exams for proper oral hygiene  Community Resource Referral / Chronic Care Management: CRR required this visit?  No  CCM required this visit?  No     Plan:     I have personally reviewed and noted the following in the patient's chart:   Medical and  social history Use of alcohol , tobacco or illicit drugs  Current medications and supplements including opioid prescriptions. Patient is not currently taking opioid prescriptions. Functional ability and status Nutritional status Physical activity Advanced directives List of other physicians Hospitalizations, surgeries, and ER visits in previous 12 months Vitals Screenings to include cognitive, depression, and falls Referrals and appointments  In addition, I have reviewed and discussed with patient certain preventive protocols, quality metrics, and best practice recommendations. A written personalized care plan for preventive services as well as general preventive health recommendations were provided to patient.     Twinkle Sockwell L Jahzaria Vary, CMA   10/28/2023   After Visit Summary: (MyChart) Due to this being a telephonic visit, the after visit summary with patients personalized plan was offered to patient via MyChart   Notes: Please refer to Routing Comments.  Medical screening examination/treatment/procedure(s) were performed by non-physician practitioner and as supervising physician I was immediately available for consultation/collaboration.  I agree with above. Adelaide Holy, MD

## 2023-10-28 NOTE — Patient Instructions (Signed)
 Tracy Larson , Thank you for taking time to come for your Medicare Wellness Visit. I appreciate your ongoing commitment to your health goals. Please review the following plan we discussed and let me know if I can assist you in the future.   Referrals/Orders/Follow-Ups/Clinician Recommendations: It was nice talking with you today.  Each day, aim for 6 glasses of water, plenty of protein in your diet and try to get up and walk/ stretch every hour for 5-10 minutes at a time.    This is a list of the screening recommended for you and due dates:  Health Maintenance  Topic Date Due   COVID-19 Vaccine (1) Never done   Zoster (Shingles) Vaccine (1 of 2) Never done   Pneumonia Vaccine (1 of 1 - PCV) Never done   DTaP/Tdap/Td vaccine (2 - Td or Tdap) 11/13/2021   Medicare Annual Wellness Visit  08/17/2023   Mammogram  09/21/2023   Flu Shot  01/31/2024   Colon Cancer Screening  09/06/2031   DEXA scan (bone density measurement)  Completed   Hepatitis C Screening  Completed   HPV Vaccine  Aged Out   Meningitis B Vaccine  Aged Out    Advanced directives: (Copy Requested) Please bring a copy of your health care power of attorney and living will to the office to be added to your chart at your convenience. You can mail to [facility] [address] [place], [place] [address] or email to [email or web address]  Next Medicare Annual Wellness Visit scheduled for next year: Yes

## 2024-02-13 ENCOUNTER — Ambulatory Visit (INDEPENDENT_AMBULATORY_CARE_PROVIDER_SITE_OTHER): Payer: Medicare HMO | Admitting: Internal Medicine

## 2024-02-13 ENCOUNTER — Encounter: Payer: Self-pay | Admitting: Internal Medicine

## 2024-02-13 VITALS — BP 104/62 | HR 74 | Ht 62.0 in | Wt 193.0 lb

## 2024-02-13 DIAGNOSIS — E559 Vitamin D deficiency, unspecified: Secondary | ICD-10-CM

## 2024-02-13 DIAGNOSIS — E250 Congenital adrenogenital disorders associated with enzyme deficiency: Secondary | ICD-10-CM

## 2024-02-13 DIAGNOSIS — E2740 Unspecified adrenocortical insufficiency: Secondary | ICD-10-CM

## 2024-02-13 NOTE — Progress Notes (Signed)
 Patient ID: Tracy Larson, female   DOB: 09/14/1955, 68 y.o.   MRN: [id number]  HPI  Tracy Larson is a 68 y.o.-year-old very pleasant female, initially referred by her PCP, Dr. Ladora, returning for follow-up for congenital adrenal hyperplasia (most likely non-classical form), adrenal insufficiency, and vitamin D  deficiency. Last visit with me 1 year ago.  Interim history: She continues on hydrocortisone  25 mg in am, 0-5 mg midday and 10 mg at night.  She did not have to double the dose since 07/2023. She had to have a steroid inj. X1 for AP and panic attack in 10/2023. No salt craving, dizziness, leg swelling. She has a history of lumbar radiculopathy, sciatica, pain, and iliotibial syndrome. Since last visit, she was seen by rheumatology. She mentions that she was diagnosed with an autoimmune disease, likely SLE. She lost 14 more lbs since last OV. She is adjusting diet to improve her autoimmune disease.  Reviewed history: Pt. has been dx with adrenal insufficiency at birth and then 68 y/o - became pregnant (she had 4 children -no history of infertility).   She did not have to have vaginoplasty.  She tried fludrocortisone (but developed edema and hypertension and had to stop).  She has h/o adrenal crises:  1985  2005 - 2/2 flu [facility]).  She did not have genetic counseling at dx.  She is on replacement with hydrocortisone  previously on high dose, up to 100 mg daily for 15 years when her children were growing up, then decreased to 50 mg daily for another 10 years, 40 mg daily for 16 years.  We decreased the dose and she tried several lower doses, currently on 20 mg in a.m. (at 6 am, then goes back to bed), then wakes up definitively at 12 pm >> changed to 20 mg in am and 10 >> 15 mg in the pm - changed it in 06/2019.  She is aware she needs to increase the dose of glucocorticoid if she has fever and needs to get injectable steroids if she cannot take p.o.   No diabetes,  hypertension, fractures.  She does have hirsutism on chin but no acne.  We tried to start eflornithine cream but this was not covered.  Previous bone density scores were normal: 07/22/17 ([place]) Lumbar spine L1-L4 Femoral neck (FN) 33% distal radius  T-score 1.6 RFN: 0.4 LFN: 0.3 n/a   [facility], 2013 Lumbar spine (L1-L3) Femoral neck (FN) 33% distal radius  T-score + 0.5 RFN:- 0.1 LFN:+ 0.5 n/a   I ordered a new bone density at our visits from 01/2022 and 01/2023 but she did not schedule this.  She mentions that her rheumatologist has ordered this.  Reviewed pertinent labs: Her labs initially normalized (except for the 17 hydroxy progesterone ) after she decreased the dose of hydrocortisone .  However, her testosterone  started to increase afterwards.  At last visit, her ACTH  was also high.  I checked with her and she was not getting testosterone  transfer.  Component     Latest Ref Rng & Units 03/31/2020  Testosterone , Serum (Total)     ng/dL 83 (H)  % Free Testosterone      % 1.0  Free Testosterone , S     pg/mL 8.3 (H)  Sex Hormone Binding Globulin     nmol/L 47.6  DHEA-Sulfate, LCMS     ug/dL 36  82-[place]-$MzfnczAzqnmzIZPI_RcwKXCSuGUclLUGfZnoIGftXLJzwIdPY$$MzfnczAzqnmzIZPI_RcwKXCSuGUclLUGfZnoIGftXLJzwIdPY$ , LC/MS/MS      ng/dL 2,564  R793 ACTH      6 - 50 pg/mL 297 (H)  Component     Latest Ref Rng & Units 08/03/2019  Testosterone , Serum (Total)     ng/dL 83 (H)  % Free Testosterone      % 1.4  Free Testosterone , S     pg/mL 12 (H)  Sex Hormone Binding Globulin     nmol/L 42.2  C206 ACTH      6 - 50 pg/mL 261 (H)  17-OH-Progesterone , LC/MS/MS     * ng/dL 4,209  DHEA-Sulfate, LCMS     ug/dL 17   Component     Latest Ref Rng & Units 07/17/2018  Testosterone , Serum (Total)     ng/dL 61 (H)  % Free Testosterone      % 1.2  Free Testosterone , S     pg/mL 7.3 (H)  Sex Hormone Binding Globulin     nmol/L 53.7  17-OH-Progesterone , LC/MS/MS     * ng/dL 8,657  R793 ACTH      6 - 50 pg/mL 32  DHEA-Sulfate, LCMS     ug/dL 11   Component     Latest Ref  Rng & Units 07/17/2017  Testosterone , Serum (Total)     ng/dL 49  % Free Testosterone      % 1.1  Free Testosterone , S     pg/mL 5.4  Sex Hormone Binding Globulin     nmol/L 54.8  DHEA-Sulfate, LCMS     ug/dL 14  82-[place]-$MzfnczAzqnmzIZPI_nPqNkBAoDjAPgfTFxuKSqsDbvgbKlwOf$$MzfnczAzqnmzIZPI_nPqNkBAoDjAPgfTFxuKSqsDbvgbKlwOf$ , LC/MS/MS     ng/dL 736  R793 ACTH      6 - 50 pg/mL 9   Component     Latest Ref Rng & Units 02/07/2016  Testosterone      3 - 41 ng/dL 37  Sex Horm Binding Glob, Serum     17.3 - 125.0 nmol/L 46.7  Testosterone  Free     0.0 - 4.2 pg/mL 1.8  ALDOSTERONE     0.0 - 30.0 ng/dL 1.0  Renin     9.832 - 5.380 ng/mL/hr 1.048  ALDOS/RENIN RATIO     0.0 - 30.0 1.0  Potassium     3.5 - 5.1 mEq/L 3.9  DHEA-SO4     29.4 - 220.5 ug/dL 73.5 (L)  82-Ybimnkbemnhzduzmnwz     ng/dL 625  ACTH      7.2 - 63.3 pg/mL 9.2   Component     Latest Ref Rng & Units 08/06/2014 02/07/2015  Testosterone      10 - 70 ng/dL 66   Sex Horm Binding Glob, Serum     14 - 73 nmol/L 25   Testosterone  Free     0.6 - 6.8 pg/mL 13.9 (H)   Testosterone -% Free     0.4 - 2.4 % 2.1   PRA LC/MS/MS     0.25 - 5.82 ng/mL/h 1.67 5.73  ALDO / PRA Ratio     0.9 - 28.9 Ratio 1.8 1.6  ALDOSTERONE     ng/dL 3 9  DHEA     897 - 8,814 ng/dL 27 (L)   Androstenedione     ng/dL 10   82-[place]-$MzfnczAzqnmzIZPI_UhSbiVoKtlJcCKbJjkURpVmmMeUgcOoK$$MzfnczAzqnmzIZPI_UhSbiVoKtlJcCKbJjkURpVmmMeUgcOoK$ , LC/MS/MS     ng/dL 7,985 (H) 8,425 (H)  R793 ACTH      6 - 50 pg/mL 78 (H) 57 (H)  Potassium     3.5 - 5.1 mEq/L  3.9  DHEA-SO4     8 - 188 ug/dL  26   96/73/7985: - 17 hydroxyprogesterone 6180 - Hemoglobin A1c 5.4% - Lipids: 176/94/40/126 - TSH 1.94 - LFTs normal - ACTH  201.7 - DHEAS 42.5 - Aldosterone <1, PRA 1.37  Previous records from Dr. Faythe: -  Patient was previously on fludrocortisone, however, this rendered her hypertensive and with edema >>  fludrocortisone was eventually stopped. Dr. Faythe mentioned that she is on a high dose of steroids, and probably her many years of over replacement with steroids have caused her secondary adrenal insufficiency.   Vitamin D  deficiency   -Previously on ergocalciferol , then 1 cholecalciferol 12,000 units daily (but she felt this was causing lethargy...) >> at last visit she was on 8000 IU >> we increased to 10,000 units daily.  At last visit we continued the same dose but I advised her to add K2 vitamin D  has the vitamin D  absorption.  At last visit she was on Osteo Bi-Flex with 2000 units vitamin D  daily.  She came off 2/2  abdominal pain in the past.  At last 2 visits but she mentions that the dose was recently increased to 5000 units (she believes) daily by rheumatology.  She has follow-up labs pending with them.  Reviewed vitamin D  levels: Lab Results  Component Value Date   VD25OH 27.34 (L) 03/31/2020   VD25OH 23.86 (L) 08/03/2019   VD25OH 34.12 07/17/2018   VD25OH 19.27 (L) 07/17/2017   VD25OH 16.22 (L) 02/07/2016   VD25OH 11.32 (L) 02/07/2015   Before the coronavirus pandemic she was going to the gym consistently, doing Silver sneakers.   She has a history of iritis.  ROS: + see HPI  I reviewed pt's medications, allergies, PMH, social hx, family hx, and changes were documented in the history of present illness. Otherwise, unchanged from my initial visit note.   Past Medical History:  Diagnosis Date   Adrenal hyperplasia (HCC)    Multiple gastric ulcers    Shingles    internal   Past surgical history: - C sections  History   Social History   Marital Status: Married    Spouse Name: [name]    Number of Children: 4   Occupational History    nurse tech    Social History Main Topics   Smoking status: Never Smoker    Smokeless tobacco: Not on file   Alcohol  Use: No   Drug Use: No   Current Outpatient Medications on File Prior to Visit  Medication Sig Dispense Refill   gabapentin  (NEURONTIN ) 100 MG capsule Take 1 capsule (100 mg total) by mouth 3 (three) times daily. (Patient not taking: Reported on 06/17/2023) 270 capsule 3   HYDROcodone -acetaminophen  (NORCO) 7.5-325 MG tablet Take 1 tablet by  mouth every 6 (six) hours as needed for moderate pain. (Patient not taking: Reported on 10/28/2023) 30 tablet 0   hydrocortisone  (CORTEF ) 10 MG tablet TAKE 3 TABLET BY MOUTH EVERY MORNING AND 1 1/2 IN THE EVENING 540 tablet 3   hydrocortisone  sodium succinate  (SOLU-CORTEF ) 100 MG injection Inject 100 mg as needed for adrenal crisis 10 each prn   Misc Natural Products (GINSENG COMPLEX PO) Take 2 tablets by mouth daily. 500 mg per patient     Turmeric-Ginger 150-25 MG CHEW as directed Orally     No current facility-administered medications on file prior to visit.   Allergies  Allergen Reactions   Other Anaphylaxis    Iodine; has tolerated in the past    No family history of diabetes, hypertension, hyperlipidemia, heart disease, cancer.   PE: BP 104/62 (BP Location: Left Arm, Patient Position: Sitting, Cuff Size: Large)   Pulse 74   Ht 5' 2 (1.575 m)   Wt 193 lb (87.5 kg)   SpO2 97%   BMI 35.30 kg/m  Wt Readings from Last 3 Encounters:  02/13/24 193 lb (87.5 kg)  10/28/23 207 lb (93.9 kg)  06/17/23 207 lb (93.9 kg)   Constitutional: overweight, in NAD Eyes: no exophthalmos ENT: no masses palpated in neck, no cervical lymphadenopathy Cardiovascular: RRR, No MRG Respiratory: CTA B Musculoskeletal: no deformities Skin: no rashes Neurological: no tremor with outstretched hands  ASSESSMENT: 1. Nonclassical CAH -In this condition, the adrenal glands cannot produce enough cortisol and the hormone production is redirected towards androgen production via increased ACTH . Therefore, we tx this with glucocorticoids not only to treat the adrenal insufficiency, but also to decrease the ACTH  and, therefore, adrenal androgens. OCPs also help in reducing the androgens.   2. Adrenal insufficiency - Associated with her CAH   3. Vitamin D  deficiency  PLAN:  1. And 2.   -Patient with a lifelong history of CAH, most likely nonclassical, but with adrenal insufficiency likely developed after  overtreatment with steroids.  She did not have to have vaginoplasty and she was successful getting pregnant with 4 children.  She does not have sodium and potassium abnormalities and no hypotension.  Also, no salt craving or dizziness.  She has a history of an absence seizure in the past but this did not appear to be related to adrenal insufficiency and she was taking plenty of hydrocortisone  at that time.  She was also on fludrocortisone in the past but this was stopped due to hypertension and edema. -Unfortunately, she is not able to decrease the doses of her hydrocortisone  to physiologic levels.  It is very difficult now to decrease them after many years of over replacement as she got used to the higher doses.  She did not feel well at physiological doses of hydrocortisone , which, we discussed, would be around 20 to 25 mcg daily - At last visit, she was taking a total of 45 mg of hydrocortisone  distributed 2/3 in am and 1/3 later in the day.  I advised her to continue to try to reduce the doses to at most 20 units in a.m. and 10 units in p.m. -Of note, 2 years ago she presented to the emergency room several times with different conditions that required prednisone  and she was not given the steroid due to history of adrenal insufficiency.  We did discuss that this is not a contraindication for prednisone  and if she needed to get it for a particular condition, she did not need to check with me but after she finished the recommended course, to go back to hydrocortisone . -She previously had higher ACTH  and testosterone  levels.  We tried to start eflornithine cream but this was not covered by her insurance.  She is not on spironolactone.  She was not interested in treatment for the high testosterone  level since this was mild and she was not bothered by hirsutism.  Of note, DHEA-S level was normal and a 17 hydroxyprogesterone checked in the past was not suppressed to indicate over replacement with  hydrocortisone . -She had a normal bone density in the past but at last 2 visits I ordered another bone density scan but she mentions this was checked by rheumatology - will need records. No fractures since last visit. - Last visit and again today I advised her to continue to try to reduce the hydrocortisone  dose at the base that allows her to feel well.  She was able to decrease the dose by 5 to 10 mg daily since last visit.  I congratulated her for this and advised her  to continue to try to reduce it. -She did not have to double the dose of hydrocortisone  did have to get the steroid parenterally x1 since last visit - Before last visit, she lost 4 pounds, but before then, she lost 20 pounds.  Since last visit she lost another 14 pounds.  She does mention that she is trying to adjust her diet to improve her autoimmune disease.  This is greatly helping. - No labs are needed for now - I suggested to: Patient Instructions  Please continue to try to reduce the steroid dose slowly (by 10 mg every 4 weeks) to: - Hydrocortisone  20 mg in a.m. and 10 mg in p.m.  - You absolutely need to take this medication every day and not skip doses. - Please double the dose if you have a fever, for the duration of the fever. - If you cannot take anything by mouth (vomiting) or you have severe diarrhea so that you eliminate the hydrocortisone  pills in your stool, please make sure that you get the hydrocortisone  in the vein instead - go to the nearest emergency department/urgent care or you may go to your PCPs office   Please come back for a follow-up appointment in 1 year.  3. Vitamin D  deficiency She was previously on Osteo Bi-Flex with 2000 units vitamin D  daily but she stopped due to stomach pain.  I advised her to start 2000 units vitamin D  daily at the last visits - now 5000 units (she thinks), increased by rheumatology: -I do not have a recent vitamin D  level for her.  The latest was low per my records: Lab  Results  Component Value Date   VD25OH 27.34 (L) 03/31/2020  - will have another recheck next year by rheumatology  Lela Fendt, MD PhD [facility]

## 2024-02-13 NOTE — Patient Instructions (Addendum)
 Please continue to try to reduce the steroid dose slowly (by 10 mg every 4 weeks) to: - Hydrocortisone  20 mg in a.m. and 10 mg in p.m.  - You absolutely need to take this medication every day and not skip doses. - Please double the dose if you have a fever, for the duration of the fever. - If you cannot take anything by mouth (vomiting) or you have severe diarrhea so that you eliminate the hydrocortisone  pills in your stool, please make sure that you get the hydrocortisone  in the vein instead - go to the nearest emergency department/urgent care or you may go to your PCPs office   Please come back for a follow-up appointment in 1 year.

## 2024-03-26 DIAGNOSIS — Z79899 Other long term (current) drug therapy: Secondary | ICD-10-CM | POA: Diagnosis not present

## 2024-03-26 DIAGNOSIS — M3505 Sjogren syndrome with inflammatory arthritis: Secondary | ICD-10-CM | POA: Diagnosis not present

## 2024-03-26 DIAGNOSIS — Z13820 Encounter for screening for osteoporosis: Secondary | ICD-10-CM | POA: Diagnosis not present

## 2024-03-26 DIAGNOSIS — Z780 Asymptomatic menopausal state: Secondary | ICD-10-CM | POA: Diagnosis not present

## 2024-03-26 DIAGNOSIS — M8589 Other specified disorders of bone density and structure, multiple sites: Secondary | ICD-10-CM | POA: Diagnosis not present

## 2024-03-26 DIAGNOSIS — M549 Dorsalgia, unspecified: Secondary | ICD-10-CM | POA: Diagnosis not present

## 2024-03-26 DIAGNOSIS — M8580 Other specified disorders of bone density and structure, unspecified site: Secondary | ICD-10-CM | POA: Diagnosis not present

## 2024-03-26 DIAGNOSIS — R700 Elevated erythrocyte sedimentation rate: Secondary | ICD-10-CM | POA: Diagnosis not present

## 2024-03-26 DIAGNOSIS — M79643 Pain in unspecified hand: Secondary | ICD-10-CM | POA: Diagnosis not present

## 2024-03-26 DIAGNOSIS — M1990 Unspecified osteoarthritis, unspecified site: Secondary | ICD-10-CM | POA: Diagnosis not present

## 2024-03-26 DIAGNOSIS — M2550 Pain in unspecified joint: Secondary | ICD-10-CM | POA: Diagnosis not present

## 2024-03-26 DIAGNOSIS — R252 Cramp and spasm: Secondary | ICD-10-CM | POA: Diagnosis not present

## 2024-03-26 DIAGNOSIS — E669 Obesity, unspecified: Secondary | ICD-10-CM | POA: Diagnosis not present

## 2024-04-20 ENCOUNTER — Other Ambulatory Visit: Payer: Self-pay

## 2024-04-20 ENCOUNTER — Ambulatory Visit: Payer: Self-pay

## 2024-04-20 ENCOUNTER — Emergency Department (HOSPITAL_BASED_OUTPATIENT_CLINIC_OR_DEPARTMENT_OTHER)
Admission: EM | Admit: 2024-04-20 | Discharge: 2024-04-20 | Disposition: A | Discharge status: Home / Self Care | Source: Ambulatory Visit | Attending: Emergency Medicine | Admitting: Emergency Medicine

## 2024-04-20 ENCOUNTER — Emergency Department (HOSPITAL_BASED_OUTPATIENT_CLINIC_OR_DEPARTMENT_OTHER)

## 2024-04-20 ENCOUNTER — Ambulatory Visit: Admitting: Family Medicine

## 2024-04-20 DIAGNOSIS — R42 Dizziness and giddiness: Secondary | ICD-10-CM | POA: Diagnosis not present

## 2024-04-20 DIAGNOSIS — R519 Headache, unspecified: Secondary | ICD-10-CM | POA: Diagnosis not present

## 2024-04-20 LAB — COMPREHENSIVE METABOLIC PANEL WITH GFR
ALT: 11 U/L (ref 0–44)
AST: 20 U/L (ref 15–41)
Albumin: 4.3 g/dL (ref 3.5–5.0)
Alkaline Phosphatase: 91 U/L (ref 38–126)
Anion gap: 11 (ref 5–15)
BUN: 7 mg/dL — ABNORMAL LOW (ref 8–23)
CO2: 26 mmol/L (ref 22–32)
Calcium: 9.7 mg/dL (ref 8.9–10.3)
Chloride: 105 mmol/L (ref 98–111)
Creatinine, Ser: 0.74 mg/dL (ref 0.44–1.00)
GFR, Estimated: >60 mL/min (ref >60)
Glucose, Bld: 96 mg/dL (ref 70–99)
Potassium: 3.8 mmol/L (ref 3.5–5.1)
Sodium: 142 mmol/L (ref 135–145)
Total Bilirubin: 0.7 mg/dL (ref 0.0–1.2)
Total Protein: 8.2 g/dL — ABNORMAL HIGH (ref 6.5–8.1)

## 2024-04-20 LAB — CBG MONITORING, ED: Glucose-Capillary: 89 mg/dL (ref 70–99)

## 2024-04-20 LAB — CBC
HCT: 43.3 % (ref 36.0–46.0)
Hemoglobin: 13.6 g/dL (ref 12.0–15.0)
MCH: 27.3 pg (ref 26.0–34.0)
MCHC: 31.4 g/dL (ref 30.0–36.0)
MCV: 86.9 fL (ref 80.0–100.0)
Platelets: 258 K/uL (ref 150–400)
RBC: 4.98 MIL/uL (ref 3.87–5.11)
RDW: 13.6 % (ref 11.5–15.5)
WBC: 7.3 K/uL (ref 4.0–10.5)
nRBC: 0 % (ref 0.0–0.2)

## 2024-04-20 MED ORDER — DIPHENHYDRAMINE HCL 50 MG/ML IJ SOLN
12.5000 mg | Freq: Once | INTRAMUSCULAR | Status: AC
  Administered 2024-04-20: 12.5 mg via INTRAVENOUS
  Filled 2024-04-20: qty 1

## 2024-04-20 MED ORDER — KETOROLAC TROMETHAMINE 15 MG/ML IJ SOLN
15.0000 mg | Freq: Once | INTRAMUSCULAR | Status: AC
  Administered 2024-04-20: 15 mg via INTRAVENOUS
  Filled 2024-04-20: qty 1

## 2024-04-20 MED ORDER — SODIUM CHLORIDE 0.9 % IV SOLN
12.5000 mg | INTRAVENOUS | Status: DC | PRN
  Filled 2024-04-20: qty 0.25

## 2024-04-20 MED ORDER — METOCLOPRAMIDE HCL 5 MG/ML IJ SOLN
10.0000 mg | Freq: Once | INTRAMUSCULAR | Status: AC
  Administered 2024-04-20: 10 mg via INTRAVENOUS
  Filled 2024-04-20: qty 2

## 2024-04-20 MED ORDER — DEXAMETHASONE SOD PHOSPHATE PF 10 MG/ML IJ SOLN
10.0000 mg | Freq: Once | INTRAMUSCULAR | Status: AC
  Administered 2024-04-20: 10 mg via INTRAVENOUS

## 2024-04-20 NOTE — ED Triage Notes (Signed)
 Patient reports headache since last night. States smelled bad fumes in the house last night and is concerned for carbon monoxide exposure. Also endorses dizziness since.

## 2024-04-20 NOTE — ED Provider Notes (Signed)
 [place] EMERGENCY DEPARTMENT AT [facility] Provider Note   CSN: [id number] Arrival date & time: 04/20/24  1142     Patient presents with: Headache   AROURA VASUDEVAN is a 68 y.o. female.   Patient to ED for evaluation of right sided headache that started earlier today. She notes she stood up on waking to go to the bathroom, lost her balance and fell forward. She denies hitting her head, LOC. She was dizzy after the fall. She is concerned for in-home poisoning after smelling an abnormal odor for the past several days after turning on her heat for the first time this year. She states she has all electric heating and appliances. She is concerned the headache and dizziness are due to this. No history of headaches.Not anticoagulated.  The history is provided by the patient. No language interpreter was used.  Headache      Prior to Admission medications   Medication Sig Start Date End Date Taking? Authorizing Provider  hydrocortisone  (CORTEF ) 10 MG tablet TAKE 3 TABLET BY MOUTH EVERY MORNING AND 1 1/2 IN THE EVENING Patient taking differently: TAKE 3 TABLET BY MOUTH EVERY MORNING AND 1 1/2 IN THE EVENING Taking 25mg  in the morning, 5 mg in afternoon, and 10mg  in the evening. 08/20/23   Trixie File, MD  hydrocortisone  sodium succinate  (SOLU-CORTEF ) 100 MG injection Inject 100 mg as needed for adrenal crisis 02/13/23   Trixie File, MD  Turmeric-Ginger 150-25 MG CHEW as directed Orally    [provider]    Allergies: Other    Review of Systems  Neurological:  Positive for headaches.    Updated Vital Signs BP 111/74   Pulse 65   Temp 98.6 F (37 C)   Resp 20   SpO2 100%   Physical Exam Vitals and nursing note reviewed.  Constitutional:      Appearance: She is well-developed.  HENT:     Head: Normocephalic.  Eyes:     Pupils: Pupils are equal, round, and reactive to light.  Cardiovascular:     Rate and Rhythm: Normal rate and regular  rhythm.  Pulmonary:     Effort: Pulmonary effort is normal.     Breath sounds: Normal breath sounds.  Abdominal:     General: Bowel sounds are normal.     Palpations: Abdomen is soft.     Tenderness: There is no abdominal tenderness. There is no guarding or rebound.  Musculoskeletal:        General: Normal range of motion.     Cervical back: Normal range of motion and neck supple.  Skin:    General: Skin is warm and dry.     Findings: No rash.  Neurological:     Mental Status: She is alert and oriented to person, place, and time.     GCS: GCS eye subscore is 4. GCS verbal subscore is 5. GCS motor subscore is 6.     Cranial Nerves: Cranial nerves 2-12 are intact. No cranial nerve deficit (CN's 3-12 intact.), dysarthria or facial asymmetry.     Sensory: Sensation is intact. No sensory deficit.     Motor: Motor function is intact. No weakness or pronator drift.     Coordination: Coordination normal.     Gait: Gait is intact.     Deep Tendon Reflexes: Reflexes are normal and symmetric. Reflexes normal.     Reflex Scores:      Patellar reflexes are 2+ on the right side and 2+ on the  left side.    (all labs ordered are listed, but only abnormal results are displayed) Labs Reviewed  COMPREHENSIVE METABOLIC PANEL WITH GFR - Abnormal; Notable for the following components:      Result Value   BUN 7 (*)    Total Protein 8.2 (*)    All other components within normal limits  CBC  CBG MONITORING, ED    EKG: None  Radiology: CT Head Wo Contrast Result Date: 04/20/2024 CLINICAL DATA:  Headache since yesterday, possible carbon monoxide exposure, dizziness EXAM: CT HEAD WITHOUT CONTRAST TECHNIQUE: Contiguous axial images were obtained from the base of the skull through the vertex without intravenous contrast. RADIATION DOSE REDUCTION: This exam was performed according to the departmental dose-optimization program which includes automated exposure control, adjustment of the mA and/or kV  according to patient size and/or use of iterative reconstruction technique. COMPARISON:  03/22/2016 FINDINGS: Brain: No acute infarct or hemorrhage. The lateral ventricles and midline structures are unremarkable. No acute extra-axial fluid collections. No mass effect. Vascular: No hyperdense vessel or unexpected calcification. Skull: Normal. Negative for fracture or focal lesion. Sinuses/Orbits: No acute finding. Other: None. IMPRESSION: 1. No acute intracranial process. Electronically Signed   By: Ozell Daring M.D.   On: 04/20/2024 16:07     Procedures   Medications Ordered in the ED  metoCLOPramide  (REGLAN ) injection 10 mg (10 mg Intravenous Given 04/20/24 1425)  ketorolac  (TORADOL ) 15 MG/ML injection 15 mg (15 mg Intravenous Given 04/20/24 1424)  diphenhydrAMINE  (BENADRYL ) injection 12.5 mg (12.5 mg Intravenous Given 04/20/24 1433)  dexamethasone (DECADRON) injection 10 mg (10 mg Intravenous Given 04/20/24 1513)    Clinical Course as of 04/20/24 1639  Mon Apr 20, 2024  1353 Patient to ED with new onset headache progressively worsening over the last several days, concerned it is associated with an odor of fumes after using her heating system for the first time this year. Fall this morning with dizziness afterward. No vomiting. She is awake, alert, oriented with no neurologic deficits on exam. Labs pending. Will obtain CT head and give a headache cocktail to address symptoms.  [SU]  1504 Re-evaluation: the patient states her headache is no better after benadryl  and reglan , and is worse as it is now bilateral. Decadron ordered. [SU]  1552 Patient now on the way to CT. Headache is resolved after Decadron. Repeat neuro exam when she returns from CT.  [SU]  1637 Repeat neuro exam unremarkable. She continues to be pain free. Dizziness has resolved also. VSS. She can be discharged home and is encouraged to follow up with her PCP this week for recheck. Discussed return precautions.  [SU]    Clinical  Course User Index [SU] Odell Balls, PA-C                                 Medical Decision Making Amount and/or Complexity of Data Reviewed Labs: ordered. Radiology: ordered.  Risk Prescription drug management.        Final diagnoses:  Acute nonintractable headache, unspecified headache type    ED Discharge Orders     None          Odell Balls, PA-C 04/20/24 1639    Jerrol Agent, MD 04/21/24 [phone]

## 2024-04-20 NOTE — Discharge Instructions (Signed)
 As we discussed, please plan to see your doctor this week for recheck. Return to the ED with any or concerning symptoms at any time.

## 2024-04-20 NOTE — Telephone Encounter (Signed)
 FYI Only or Action Required?: FYI only for provider.  Patient was last seen in primary care on 06/17/2023 by Alvia Corean CROME, FNP.  Called Nurse Triage reporting Headache.  Symptoms began several days ago.  Interventions attempted: Rest, hydration, or home remedies.  Symptoms are: unchanged.  Triage Disposition: See HCP Within 4 Hours (Or PCP Triage)  Patient/caregiver understands and will follow disposition?: Yes  Reason for Disposition  [1] SEVERE headache (e.g., excruciating) AND [2] not improved after 2 hours of pain medicine  Answer Assessment - Initial Assessment Questions Pt reports HA, dizziness, dry eyes x 3 days. States went to the ED and triage nurse told her to go to PCP. States dizziness occurs when she is home, states she smells a motor odor at home when she turns on the heat.   Pt contacted her property manager to make her aware, states their staff will check it. (Did not triage as CO d/t report of odor). This RN recommended she also call the gas company or fire dept to evaluate source of odor. States does not have gas in the home, will contact fire dept. Also reports fall last night d/t dizziness, denies head strike or LOC. Denies hx of migraines.  Has not taken tylenol  or ibuprofen , states she does not like medications. OV scheduled, ED precautions reviewed, pt verbalized understanding.   1. LOCATION:      Headache  2. ONSET: When did the headache start? (e.g., minutes, hours, days)      3 days  3. PATTERN: Does the pain come and go, or has it been constant since it started?     HA is constant, dizziness is when at home with heat on   4. SEVERITY: How bad is the pain? and What does it keep you from doing?  (e.g., Scale 1-10; mild, moderate, or severe)     8/10  6. CAUSE: What do you think is causing the headache?     Unknown  7. MIGRAINE: Have you been diagnosed with migraine headaches? If Yes, ask: Is this headache similar?       Denies  8. HEAD INJURY: Has there been any recent injury to your head?      Deneis  9. OTHER SYMPTOMS: Do you have any other symptoms? (e.g., fever, stiff neck, eye pain, sore throat, cold symptoms)     Dry eyes  Protocols used: Headache-A-AH Copied from CRM #[id number]. Topic: Clinical - Red Word Triage >> Apr 20, 2024 10:26 AM Thersia BROCKS wrote: [facility] that prompted transfer to Nurse Triage: patient called in stated she felt like she been exposed to carbon monxide , is having really bad headache, dry eyes , stated she is having blurred vision    ----------------------------------------------------------------------- From previous Reason for Contact - Scheduling: Patient/patient representative is calling to schedule an appointment. Refer to attachments for appointment information.

## 2024-07-23 ENCOUNTER — Ambulatory Visit (INDEPENDENT_AMBULATORY_CARE_PROVIDER_SITE_OTHER): Admitting: Internal Medicine

## 2024-07-23 ENCOUNTER — Encounter: Payer: Self-pay | Admitting: Internal Medicine

## 2024-07-23 VITALS — BP 122/70 | HR 87 | Ht 62.0 in | Wt 190.0 lb

## 2024-07-23 DIAGNOSIS — E559 Vitamin D deficiency, unspecified: Secondary | ICD-10-CM | POA: Diagnosis not present

## 2024-07-23 DIAGNOSIS — E250 Congenital adrenogenital disorders associated with enzyme deficiency: Secondary | ICD-10-CM

## 2024-07-23 DIAGNOSIS — E2740 Unspecified adrenocortical insufficiency: Secondary | ICD-10-CM

## 2024-07-23 MED ORDER — HYDROCORTISONE 10 MG PO TABS: ORAL_TABLET | ORAL | 3 refills | Status: AC

## 2024-07-23 MED ORDER — HYDROCORTISONE SOD SUC (PF) 100 MG IJ SOLR: INTRAMUSCULAR | 99 refills | Status: AC

## 2024-07-23 NOTE — Progress Notes (Addendum)
 Patient ID: Tracy Larson, female   DOB: [birth year], 69 y.o.   MRN: [id number]  HPI  Tracy Larson is a 69 y.o.-year-old very pleasant female, initially referred by her PCP, Dr. Ladora, returning for follow-up for congenital adrenal hyperplasia (most likely non-classical form), adrenal insufficiency, and vitamin D  deficiency. Last visit with me 5 months ago.  Interim history: She is on hydrocortisone  20 mg in am and 10 mg at night.  She was able to decrease the dose since last visit, however, she mentions that she is feeling very poorly and knows that she will need to increase the dose. She describes excruciating pain in her abdomen - after coming off Turmeric >> now building back up. She has nausea, dizziness also. She did not have to double the dose since last visit.  No salt craving, leg swelling. She describes chocolate craving. She has a history of lumbar radiculopathy, sciatica, pain, and iliotibial syndrome.  She had steroid injections since last visit. She sees rheumatology.  She has Sjogren syndrome with inflammatory arthropathy.  Before last visit, she lost 14 more pounds and lost 8 pounds since then despite heavy clothing today.  Reviewed and addended history: Pt. has been dx with adrenal insufficiency at birth and then 69 y/o - became pregnant (she had 4 children -no history of infertility).   She did not have to have vaginoplasty.  She tried fludrocortisone (but developed edema and hypertension and had to stop).  She has h/o adrenal crises:  1985  2005 - 2/2 flu [facility]).  She did not have genetic counseling at dx.  She is on replacement with hydrocortisone  previously on high dose, up to 100 mg daily for 15 years when her children were growing up, then decreased to 50 mg daily for another 10 years, 40 mg daily for 16 years.  We decreased the dose and she tried several lower doses, currently on 20 mg in a.m. (at 6 am, then goes back to bed), then wakes up  definitively at 12 pm >> changed to 20 mg in am and 10 >> 15 mg in the pm - changed it in 06/2019.  She is aware she needs to increase the dose of glucocorticoid if she has fever and needs to get injectable steroids if she cannot take p.o.   No diabetes, hypertension, fractures.  She does have hirsutism on chin but no acne.  We tried to start eflornithine cream but this was not covered.  Previous bone density scores were normal: 07/22/17 ([place]) Lumbar spine L1-L4 Femoral neck (FN) 33% distal radius  T-score 1.6 RFN: 0.4 LFN: 0.3 n/a   [facility], 2013 Lumbar spine (L1-L3) Femoral neck (FN) 33% distal radius  T-score + 0.5 RFN:- 0.1 LFN:+ 0.5 n/a   I ordered a new bone density at our visits from 01/2022 and 01/2023 but she did not schedule this.    She had this checked by rheumatology in 2025 >> reportedly normal.  Reviewed pertinent labs: Her labs initially normalized (except for the 17 hydroxy progesterone ) after she decreased the dose of hydrocortisone .  However, her testosterone  increased afterwards.  I checked with her and she was not getting testosterone  transfer.  Component     Latest Ref Rng & Units 03/31/2020  Testosterone , Serum (Total)     ng/dL 83 (H)  % Free Testosterone      % 1.0  Free Testosterone , S     pg/mL 8.3 (H)  Sex Hormone Binding Globulin     nmol/L  47.6  DHEA-Sulfate, LCMS     ug/dL 36  82-[place]-$MzfnczAzqnmzIZPI_oHHSxRayjqbGcAOwifPCQSublrSluEZr$$MzfnczAzqnmzIZPI_oHHSxRayjqbGcAOwifPCQSublrSluEZr$ , LC/MS/MS      ng/dL 2,564  R793 ACTH      6 - 50 pg/mL 297 (H)   Component     Latest Ref Rng & Units 08/03/2019  Testosterone , Serum (Total)     ng/dL 83 (H)  % Free Testosterone      % 1.4  Free Testosterone , S     pg/mL 12 (H)  Sex Hormone Binding Globulin     nmol/L 42.2  C206 ACTH      6 - 50 pg/mL 261 (H)  17-OH-Progesterone , LC/MS/MS     * ng/dL 4,209  DHEA-Sulfate, LCMS     ug/dL 17   Component     Latest Ref Rng & Units 07/17/2018  Testosterone , Serum (Total)     ng/dL 61 (H)  % Free Testosterone      % 1.2  Free  Testosterone , S     pg/mL 7.3 (H)  Sex Hormone Binding Globulin     nmol/L 53.7  17-OH-Progesterone , LC/MS/MS     * ng/dL 8,657  R793 ACTH      6 - 50 pg/mL 32  DHEA-Sulfate, LCMS     ug/dL 11   Component     Latest Ref Rng & Units 07/17/2017  Testosterone , Serum (Total)     ng/dL 49  % Free Testosterone      % 1.1  Free Testosterone , S     pg/mL 5.4  Sex Hormone Binding Globulin     nmol/L 54.8  DHEA-Sulfate, LCMS     ug/dL 14  82-[place]-$MzfnczAzqnmzIZPI_AgxuqwJFQKcLVlqUqmtFKYphJiDbvxiU$$MzfnczAzqnmzIZPI_AgxuqwJFQKcLVlqUqmtFKYphJiDbvxiU$ , LC/MS/MS     ng/dL 736  R793 ACTH      6 - 50 pg/mL 9   Component     Latest Ref Rng & Units 02/07/2016  Testosterone      3 - 41 ng/dL 37  Sex Horm Binding Glob, Serum     17.3 - 125.0 nmol/L 46.7  Testosterone  Free     0.0 - 4.2 pg/mL 1.8  ALDOSTERONE     0.0 - 30.0 ng/dL 1.0  Renin     9.832 - 5.380 ng/mL/hr 1.048  ALDOS/RENIN RATIO     0.0 - 30.0 1.0  Potassium     3.5 - 5.1 mEq/L 3.9  DHEA-SO4     29.4 - 220.5 ug/dL 73.5 (L)  82-Ybimnkbemnhzduzmnwz     ng/dL 625  ACTH      7.2 - 63.3 pg/mL 9.2   Component     Latest Ref Rng & Units 08/06/2014 02/07/2015  Testosterone      10 - 70 ng/dL 66   Sex Horm Binding Glob, Serum     14 - 73 nmol/L 25   Testosterone  Free     0.6 - 6.8 pg/mL 13.9 (H)   Testosterone -% Free     0.4 - 2.4 % 2.1   PRA LC/MS/MS     0.25 - 5.82 ng/mL/h 1.67 5.73  ALDO / PRA Ratio     0.9 - 28.9 Ratio 1.8 1.6  ALDOSTERONE     ng/dL 3 9  DHEA     897 - 8,814 ng/dL 27 (L)   Androstenedione     ng/dL 10   82-[place]-$MzfnczAzqnmzIZPI_BFWaCsIIuMSOwHsVekaSHftcDbaNwkKG$$MzfnczAzqnmzIZPI_BFWaCsIIuMSOwHsVekaSHftcDbaNwkKG$ , LC/MS/MS     ng/dL 7,985 (H) 8,425 (H)  R793 ACTH      6 - 50 pg/mL 78 (H) 57 (H)  Potassium     3.5 - 5.1 mEq/L  3.9  DHEA-SO4     8 - 188 ug/dL  26  09/24/2012: - 17 hydroxyprogesterone 6180 - Hemoglobin A1c 5.4% - Lipids: 176/94/40/126 - TSH 1.94 - LFTs normal - ACTH  201.7 - DHEAS 42.5 - Aldosterone <1, PRA 1.37  Previous records from Dr. Faythe: - Patient was previously on fludrocortisone, however, this rendered her hypertensive and with edema >>   fludrocortisone was eventually stopped. Dr. Faythe mentioned that she is on a high dose of steroids, and probably her many years of over replacement with steroids have caused her secondary adrenal insufficiency.   Vitamin D  deficiency  - Now advised otology - Previously on ergocalciferol , then 1 cholecalciferol 12,000 units daily (but she felt this was causing lethargy...) >> at last visit she was on 8000 IU >> we increased to 10,000 units daily.  At last visit we continued the same dose but I advised her to add K2 vitamin D  has the vitamin D  absorption.  She was previously on Osteo Bi-Flex with 2000 units vitamin D  daily.  She came off 2/2  abdominal pain in the past.    Her vitamin D  dose increased to 5000 units (she believes) daily by rheumatology.  However, at this visit she tells me that he came off in the last year...  Reviewed vitamin D  levels: Lab Results  Component Value Date   VD25OH 27.34 (L) 03/31/2020   VD25OH 23.86 (L) 08/03/2019   VD25OH 34.12 07/17/2018   VD25OH 19.27 (L) 07/17/2017   VD25OH 16.22 (L) 02/07/2016   VD25OH 11.32 (L) 02/07/2015   She has a history of iritis.  ROS: + see HPI  I reviewed pt's medications, allergies, PMH, social hx, family hx, and changes were documented in the history of present illness. Otherwise, unchanged from my initial visit note.   Past Medical History:  Diagnosis Date   Adrenal hyperplasia    Multiple gastric ulcers    Shingles    internal   Past surgical history: - C sections  History   Social History   Marital Status: Married    Spouse Name: [name]    Number of Children: 4   Occupational History    nurse tech    Social History Main Topics   Smoking status: Never Smoker    Smokeless tobacco: Not on file   Alcohol  Use: No   Drug Use: No   Current Outpatient Medications on File Prior to Visit  Medication Sig Dispense Refill   hydrocortisone  (CORTEF ) 10 MG tablet TAKE 3 TABLET BY MOUTH EVERY MORNING AND 1 1/2 IN  THE EVENING (Patient taking differently: TAKE 3 TABLET BY MOUTH EVERY MORNING AND 1 1/2 IN THE EVENING Taking 25mg  in the morning, 5 mg in afternoon, and 10mg  in the evening.) 540 tablet 3   hydrocortisone  sodium succinate  (SOLU-CORTEF ) 100 MG injection Inject 100 mg as needed for adrenal crisis 10 each prn   Turmeric-Ginger 150-25 MG CHEW as directed Orally     No current facility-administered medications on file prior to visit.   Allergies  Allergen Reactions   Other Anaphylaxis    Iodine; has tolerated in the past    No family history of diabetes, hypertension, hyperlipidemia, heart disease, cancer.   PE: BP 122/70   Pulse 87   Ht 5' 2 (1.575 m)   Wt 190 lb (86.2 kg)   SpO2 98%   BMI 34.75 kg/m    Wt Readings from Last 3 Encounters:  07/23/24 190 lb (86.2 kg)  02/13/24 193 lb (87.5 kg)  10/28/23 207 lb (93.9 kg)   Constitutional: overweight, in NAD Eyes:  no exophthalmos ENT: no masses palpated in neck, no cervical lymphadenopathy Cardiovascular: RRR, No MRG Respiratory: CTA B Musculoskeletal: no deformities Skin: no rashes Neurological: no tremor with outstretched hands  ASSESSMENT: 1. Nonclassical CAH -In this condition, the adrenal glands cannot produce enough cortisol and the hormone production is redirected towards androgen production via increased ACTH . Therefore, we tx this with glucocorticoids not only to treat the adrenal insufficiency, but also to decrease the ACTH  and, therefore, adrenal androgens. OCPs also help in reducing the androgens.   2. Adrenal insufficiency - Associated with her CAH   3. Vitamin D  deficiency  PLAN:  1. And 2.   -Patient with lifelong history of CAH, most likely nonclassical, but with adrenal insufficiency likely developed after overtreatment with steroids.  She did not have to have vaginoplasty and she was successful getting pregnant with 4 children so classical CAH is unlikely.  She also does not have sodium and potassium  abnormalities and no hypotension.  No salt craving or dizziness.  She does have a history of absence seizure in the past but this did not appear to be related to adrenal insufficiency as she was taking plenty of hydrocortisone  at that time.  She was also on fludrocortisone in the past but this was stopped due to hypertension and edema. -Unfortunately, she was unable to decrease the hydrocortisone  doses to physiologic levels after many years of over replacement as she got used to the higher doses.  We discussed that the physiologic dose will be approximately 20 to 25 mcg daily.  At previous visit I advised her to continue to try to decrease the doses very slowly. -For last visit, she presented to the emergency room several times with different conditions that required prednisone  and she was not given the steroid due to history of adrenal insufficiency.  We discussed that this was not a contraindication for prednisone  and she definitely needed to get it for that particular condition without checking with me.  I did recommend that after finishing the prednisone  course, to go back to her hydrocortisone  schedule. -At last visit, she was taking 35 to 40 mg of hydrocortisone  daily and we discussed about trying to decrease this slowly to 20 units in a.m. and 10 units in p.m. at today's visit, she is taking this dose but she feels that this is not enough for her.  She does have abdominal spasms which are quite intense and she also has nausea and dizziness.  She mentions that she absolutely needs to increase the dose of hydrocortisone  but needs some guidance for this.  She would prefer to increase the dose to 50 or even 100 mg of hydrocortisone  daily but I strongly advised her that this would be disastrous for her from many points of view including increased risk of diabetes, hypertension, osteoporosis, etc., but also will lower her immunity and any infection can have disastrous consequences.  She agrees to increase the  dose only slightly, by 5 to 10 mg daily.  We did discuss about chronicity, which was approved for classical CAH, but not nonclassical CAH.  This would have been helpful for her. -Before last visit, she lost 14 pounds, previously lost 20.  I believe that this may be also related to reducing her steroid dose, but she was also trying to adjust her diet to improve her autoimmune disease.  She lost another 3 pounds. -She previously had higher ACTH  and testosterone  levels. We tried to start eflornithine cream but this was not covered by her  insurance.  She is not on spironolactone.  She was not interested in treatment for the high testosterone  levels since this was mild and she was not bothered by hirsutism.  Of note, her DHEA-S level was normal. - She had a normal bone density in the past but she did not have another DXA as she is trying to get this ordered by her rheumatologist.  She had this done since last visit and this was reportedly normal. - She did not have to double the dose of steroid or get it parenterally since last visit.  However, we did discuss about sick day rules at today's visit (see patient instructions) - I suggested to: Patient Instructions  You can increase steroid dose: - Hydrocortisone   25 mg in a.m.  0-5 mg midday 10 mg in p.m.  - You absolutely need to take this medication every day and not skip doses. - Please double the dose if you have a fever, for the duration of the fever. - If you cannot take anything by mouth (vomiting) or you have severe diarrhea so that you eliminate the hydrocortisone  pills in your stool, please make sure that you get the hydrocortisone  in the vein instead - go to the nearest emergency department/urgent care or you may go to your PCPs office   Restart vitamin D  5000 units daily.  Please come back for a follow-up appointment in August.  3. Vitamin D  deficiency She was previously on Osteo Bi-Flex with 2000 units vitamin D  daily but she stopped due  to stomach pain.  I advised her to start 2000 units vitamin D  daily at the last visits - then 5000 units daily, increase by rheumatology - now off! - I do not have a recent vitamin D  level for her.  Previously, Lab Results  Component Value Date   VD25OH 27.34 (L) 03/31/2020  - I advised her to restart the supplement - I will recheck this at next visit  Requested Prescriptions   Signed Prescriptions Disp Refills   hydrocortisone  sodium succinate  (SOLU-CORTEF ) 100 MG injection 10 each prn    Sig: Inject 100 mg as needed for adrenal crisis   hydrocortisone  (CORTEF ) 10 MG tablet 540 tablet 3    Sig: TAKE 2.5 TABLET BY MOUTH EVERY MORNING, 1/2  at lunch and 1 TABLET IN THE EVENING   Lela Fendt, MD PhD [facility] Endocrin[facility]

## 2024-07-23 NOTE — Patient Instructions (Addendum)
 You can increase steroid dose: - Hydrocortisone   25 mg in a.m.  0-5 mg midday 10 mg in p.m.  - You absolutely need to take this medication every day and not skip doses. - Please double the dose if you have a fever, for the duration of the fever. - If you cannot take anything by mouth (vomiting) or you have severe diarrhea so that you eliminate the hydrocortisone  pills in your stool, please make sure that you get the hydrocortisone  in the vein instead - go to the nearest emergency department/urgent care or you may go to your PCPs office   Restart vitamin D  5000 units daily.  Please come back for a follow-up appointment in August.

## 2024-07-24 ENCOUNTER — Encounter: Payer: Self-pay | Admitting: Internal Medicine

## 2024-09-04 ENCOUNTER — Ambulatory Visit: Admitting: Nurse Practitioner

## 2024-10-28 ENCOUNTER — Ambulatory Visit

## 2025-02-15 ENCOUNTER — Ambulatory Visit: Admitting: Internal Medicine

## 2025-02-24 ENCOUNTER — Ambulatory Visit: Admitting: Internal Medicine
